# Patient Record
Sex: Male | Born: 1945 | Race: White | Hispanic: No | Marital: Married | Smoking: Former smoker
Health system: Southern US, Community
[De-identification: ages and names within clinical notes are randomized; demographics above are authoritative.]

## PROBLEM LIST (undated history)

## (undated) DIAGNOSIS — Z87442 Personal history of urinary calculi: Secondary | ICD-10-CM

## (undated) DIAGNOSIS — R0600 Dyspnea, unspecified: Secondary | ICD-10-CM

## (undated) DIAGNOSIS — E119 Type 2 diabetes mellitus without complications: Secondary | ICD-10-CM

## (undated) DIAGNOSIS — Z902 Acquired absence of lung [part of]: Secondary | ICD-10-CM

## (undated) DIAGNOSIS — Z8619 Personal history of other infectious and parasitic diseases: Secondary | ICD-10-CM

## (undated) DIAGNOSIS — I472 Ventricular tachycardia, unspecified: Secondary | ICD-10-CM

## (undated) DIAGNOSIS — N201 Calculus of ureter: Secondary | ICD-10-CM

## (undated) DIAGNOSIS — Z972 Presence of dental prosthetic device (complete) (partial): Secondary | ICD-10-CM

## (undated) DIAGNOSIS — I4510 Unspecified right bundle-branch block: Secondary | ICD-10-CM

## (undated) DIAGNOSIS — I4729 Other ventricular tachycardia: Secondary | ICD-10-CM

## (undated) DIAGNOSIS — R053 Chronic cough: Secondary | ICD-10-CM

## (undated) DIAGNOSIS — I4719 Other supraventricular tachycardia: Secondary | ICD-10-CM

## (undated) DIAGNOSIS — R05 Cough: Secondary | ICD-10-CM

## (undated) DIAGNOSIS — Z85038 Personal history of other malignant neoplasm of large intestine: Secondary | ICD-10-CM

## (undated) DIAGNOSIS — I471 Supraventricular tachycardia: Secondary | ICD-10-CM

## (undated) DIAGNOSIS — M1990 Unspecified osteoarthritis, unspecified site: Secondary | ICD-10-CM

## (undated) DIAGNOSIS — Z9889 Other specified postprocedural states: Secondary | ICD-10-CM

## (undated) DIAGNOSIS — J189 Pneumonia, unspecified organism: Secondary | ICD-10-CM

## (undated) DIAGNOSIS — I10 Essential (primary) hypertension: Secondary | ICD-10-CM

## (undated) DIAGNOSIS — R0609 Other forms of dyspnea: Secondary | ICD-10-CM

## (undated) DIAGNOSIS — E782 Mixed hyperlipidemia: Secondary | ICD-10-CM

## (undated) DIAGNOSIS — I493 Ventricular premature depolarization: Secondary | ICD-10-CM

## (undated) HISTORY — DX: Pneumonia, unspecified organism: J18.9

## (undated) HISTORY — PX: CYSTOSCOPY/RETROGRADE/URETEROSCOPY/STONE EXTRACTION WITH BASKET: SHX5317

## (undated) HISTORY — PX: TONSILLECTOMY AND ADENOIDECTOMY: SUR1326

## (undated) HISTORY — PX: CARDIAC CATHETERIZATION: SHX172

## (undated) HISTORY — PX: COLON RESECTION SIGMOID: SHX6737

---
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
## 1999-03-10 ENCOUNTER — Inpatient Hospital Stay (HOSPITAL_COMMUNITY): Admission: EM | Admit: 1999-03-10 | Discharge: 1999-03-12 | Payer: Self-pay | Admitting: Emergency Medicine

## 1999-03-10 ENCOUNTER — Encounter: Payer: Self-pay | Admitting: Emergency Medicine

## 1999-03-28 ENCOUNTER — Encounter: Admission: RE | Admit: 1999-03-28 | Discharge: 1999-03-28 | Payer: Self-pay | Admitting: Internal Medicine

## 1999-05-10 ENCOUNTER — Emergency Department (HOSPITAL_COMMUNITY): Admission: EM | Admit: 1999-05-10 | Discharge: 1999-05-10 | Payer: Self-pay | Admitting: Emergency Medicine

## 1999-05-29 ENCOUNTER — Encounter: Admission: RE | Admit: 1999-05-29 | Discharge: 1999-05-29 | Payer: Self-pay | Admitting: Internal Medicine

## 1999-10-05 ENCOUNTER — Encounter: Admission: RE | Admit: 1999-10-05 | Discharge: 1999-10-05 | Payer: Self-pay | Admitting: Internal Medicine

## 1999-10-08 ENCOUNTER — Encounter: Admission: RE | Admit: 1999-10-08 | Discharge: 1999-10-08 | Payer: Self-pay | Admitting: Hematology and Oncology

## 2000-01-17 ENCOUNTER — Encounter: Admission: RE | Admit: 2000-01-17 | Discharge: 2000-01-17 | Payer: Self-pay | Admitting: Internal Medicine

## 2001-03-30 ENCOUNTER — Encounter: Admission: RE | Admit: 2001-03-30 | Discharge: 2001-03-30 | Payer: Self-pay

## 2001-04-01 ENCOUNTER — Encounter: Admission: RE | Admit: 2001-04-01 | Discharge: 2001-04-01 | Payer: Self-pay | Admitting: Internal Medicine

## 2002-01-25 ENCOUNTER — Emergency Department (HOSPITAL_COMMUNITY): Admission: EM | Admit: 2002-01-25 | Discharge: 2002-01-25 | Payer: Self-pay | Admitting: Emergency Medicine

## 2002-01-25 ENCOUNTER — Encounter: Payer: Self-pay | Admitting: Emergency Medicine

## 2002-02-08 ENCOUNTER — Inpatient Hospital Stay (HOSPITAL_COMMUNITY): Admission: EM | Admit: 2002-02-08 | Discharge: 2002-02-10 | Payer: Self-pay | Admitting: Emergency Medicine

## 2002-02-08 ENCOUNTER — Encounter (INDEPENDENT_AMBULATORY_CARE_PROVIDER_SITE_OTHER): Payer: Self-pay | Admitting: Cardiology

## 2002-02-08 ENCOUNTER — Encounter: Payer: Self-pay | Admitting: Emergency Medicine

## 2002-05-18 ENCOUNTER — Encounter: Admission: RE | Admit: 2002-05-18 | Discharge: 2002-05-18 | Payer: Self-pay | Admitting: Internal Medicine

## 2002-06-21 ENCOUNTER — Ambulatory Visit (HOSPITAL_COMMUNITY): Admission: RE | Admit: 2002-06-21 | Discharge: 2002-06-21 | Payer: Self-pay | Admitting: Internal Medicine

## 2002-06-21 ENCOUNTER — Emergency Department (HOSPITAL_COMMUNITY): Admission: EM | Admit: 2002-06-21 | Discharge: 2002-06-21 | Payer: Self-pay | Admitting: Emergency Medicine

## 2002-06-21 ENCOUNTER — Encounter: Admission: RE | Admit: 2002-06-21 | Discharge: 2002-06-21 | Payer: Self-pay | Admitting: Internal Medicine

## 2003-03-22 ENCOUNTER — Encounter: Admission: RE | Admit: 2003-03-22 | Discharge: 2003-03-22 | Payer: Self-pay | Admitting: Internal Medicine

## 2003-05-27 ENCOUNTER — Encounter: Admission: RE | Admit: 2003-05-27 | Discharge: 2003-05-27 | Payer: Self-pay | Admitting: Internal Medicine

## 2003-06-07 ENCOUNTER — Encounter: Admission: RE | Admit: 2003-06-07 | Discharge: 2003-06-07 | Payer: Self-pay | Admitting: Internal Medicine

## 2003-07-01 ENCOUNTER — Encounter: Admission: RE | Admit: 2003-07-01 | Discharge: 2003-07-01 | Payer: Self-pay | Admitting: Internal Medicine

## 2004-02-01 ENCOUNTER — Emergency Department (HOSPITAL_COMMUNITY): Admission: EM | Admit: 2004-02-01 | Discharge: 2004-02-01 | Payer: Self-pay | Admitting: Emergency Medicine

## 2004-02-17 ENCOUNTER — Emergency Department (HOSPITAL_COMMUNITY): Admission: EM | Admit: 2004-02-17 | Discharge: 2004-02-17 | Payer: Self-pay

## 2004-02-21 ENCOUNTER — Ambulatory Visit: Payer: Self-pay | Admitting: Internal Medicine

## 2004-11-18 ENCOUNTER — Emergency Department (HOSPITAL_COMMUNITY): Admission: EM | Admit: 2004-11-18 | Discharge: 2004-11-18 | Payer: Self-pay | Admitting: Emergency Medicine

## 2004-11-25 ENCOUNTER — Encounter: Admission: RE | Admit: 2004-11-25 | Discharge: 2004-11-25 | Payer: Self-pay | Admitting: Specialist

## 2005-01-21 ENCOUNTER — Ambulatory Visit (HOSPITAL_COMMUNITY): Admission: RE | Admit: 2005-01-21 | Discharge: 2005-01-21 | Payer: Self-pay | Admitting: *Deleted

## 2005-01-21 ENCOUNTER — Encounter (INDEPENDENT_AMBULATORY_CARE_PROVIDER_SITE_OTHER): Payer: Self-pay | Admitting: Specialist

## 2005-03-01 ENCOUNTER — Inpatient Hospital Stay (HOSPITAL_COMMUNITY): Admission: RE | Admit: 2005-03-01 | Discharge: 2005-03-05 | Payer: Self-pay

## 2005-03-01 ENCOUNTER — Encounter (INDEPENDENT_AMBULATORY_CARE_PROVIDER_SITE_OTHER): Payer: Self-pay | Admitting: Specialist

## 2007-05-11 ENCOUNTER — Ambulatory Visit (HOSPITAL_COMMUNITY): Admission: RE | Admit: 2007-05-11 | Discharge: 2007-05-11 | Payer: Self-pay | Admitting: *Deleted

## 2007-05-11 ENCOUNTER — Encounter (INDEPENDENT_AMBULATORY_CARE_PROVIDER_SITE_OTHER): Payer: Self-pay | Admitting: *Deleted

## 2010-09-25 NOTE — Op Note (Signed)
NAMECAROLD, EISNER                ACCOUNT NO.:  [id number]   MEDICAL RECORD NO.:  [id number]          PATIENT TYPE:  AMB   LOCATION:  ENDO                         FACILITY:  [facility]   PHYSICIAN:  Georgiana Spinner, M.D.    DATE OF BIRTH:  [birth year]   DATE OF PROCEDURE:  DATE OF DISCHARGE:                               OPERATIVE REPORT   PROCEDURE:  Colonoscopy.   INDICATIONS:  Colon cancer.   ANESTHESIA:  Fentanyl 100 mcg and Versed 6 mg.   PROCEDURE:  With the patient mildly sedated in the left lateral  decubitus position, a rectal examination was attempted.  Subsequently,  the Pentax videoscopic colonoscope was inserted in the rectum and passed  under direct vision with pressure applied.  We were able to reach the  cecum identified by the ileocecal valve and base of cecum, both of which  were photographed.  From this point, the colonoscope was slowly  withdrawn, taking circumferential views of the colonic mucosa stopping  first in the ascending colon where a small polyp was seen, photographed  and removed using hot biopsy forceps technique setting of 20/150 blended  current.  We next stopped in the descending colon where a similar  appearing polyp was seen and it too was removed using hot biopsy forceps  technique, both with a setting of 20/150 blended current.  The endoscope  was then withdrawn all the way to the rectum which appeared normal on  direct and showed hemorrhoids on retroflexed view.  The endoscope was  straightened and withdrawn.  The patient's vital signs and pulse  oximeter remained stable.  The patient tolerated the procedure well  without apparent complications.   FINDINGS:  Two small polyps as noted, one in the ascending and one in  the descending colon.  Internal hemorrhoids were noted as well.  Otherwise, an unremarkable exam.   PLAN:  Await biopsy report.  The patient will call me for results and  followup with me as needed as an outpatient.     ______________________________  Georgiana Spinner, M.D.     GMO/MEDQ  D:  05/11/2007  T:  05/11/2007  Job:  [id number]

## 2010-09-28 NOTE — Cardiovascular Report (Signed)
NAME:  Herbert Evans, Herbert Evans                          ACCOUNT NO.:  [id number]   MEDICAL RECORD NO.:  [id number]                   PATIENT TYPE:  INP   LOCATION:  2908                                 FACILITY:  [facility]   PHYSICIAN:  Robynn Pane, M.D.               DATE OF BIRTH:  [birth year]   DATE OF PROCEDURE:  02/09/2002  DATE OF DISCHARGE:  02/10/2002                              CARDIAC CATHETERIZATION   PROCEDURE:  .  Left cardiac catheterization with selective right and left coronary  angiography, left ventriculography via right groin using Judkins technique.   INDICATION FOR THE PROCEDURE:  The patient is a 65 year old white male with  a past medical history significant for non-insulin dependent diabetes  mellitus, hypercholesterolemia, history of recurrent SVT who came to the  office complaining of fluttering in the chest pain associated with  lightheadedness since yesterday and was noted to be in SVT.  Patient was  converted to sinus rhythm with IV adenosine.  Patient states he had a  similar episode approximately two weeks ago requiring Adenosine. Patient  also gives history of chest pain radiating to the neck, grade 4/10 described  as tightness associated with diaphoresis when under stress or with exertion  lasting 20-30 minutes relieved with rest.  Denies any syncopal episodes in  the past.  Denies any cardiac work-up in the past.  Denies PND, orthopnea or  leg swelling.  Denies prolonged immobilization. Denies shortness of breath.  Denies fever, cough or chills.   PAST MEDICAL HISTORY:  As above.   PAST SURGICAL HISTORY:  He had left lung partial resection x2 at the age of  78 and 89 and left ear mastoid surgery at the age of 21.   ALLERGIES:  No known drug allergies.   HOME MEDICATIONS:  1. Glucophage 500 mg p.o. b.i.d.  2. Baby aspirin 1 p.o. daily  3. Patient was on some statin which he does not remember, which he could not     tolerate.   SOCIAL HISTORY:  He is  married and has four children. No history of smoking.  Drinks beer occasionally.  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  Born in  [place] and lives in [place].   FAMILY HISTORY:  Father died of stroke, mother died of massive MI in her  36s.  He has four sisters in good health.   PHYSICAL EXAMINATION:  GENERAL:  On examination, he is awake, alert and  oriented x 3 in no acute distress.  VITAL SIGNS:  Blood pressure was 103/66, pulse was 78, regular.  HEENT:  Conjunctivae was pink.  NECK:  Supple, no JVD, no bruits.  LUNGS:  Patient has decreased breath sounds at left lung field.  HEART: Cardiovascular examination: S1 and S2 was normal. There was no S3  gallop or murmur.  ABDOMEN:  Soft, bowel sounds are present, nontender.  EXTREMITY:  There is no clubbing,  cyanosis or edema.   LABORATORY AND ACCESSORY DATA:  His ECG showed normal sinus rhythm with ST-T  wave changes, ST-T wave depression in anterolateral leads, which were new as  compared to May 18.  His cardiac enzymes are negative.  Troponin I and CPKs  are negative.  Due to typical anginal chest pain and new ECG changes, the  patient was discussed with various options of treatment, i.e. left  catheterization, possible PTCA and stenting versus noninvasive stress  testing, its risks and benefits, i.e., death, MI, stroke, need for emergency  CABG, risks of re-stenosis, local vascular complications, etc., and  consented for PCI.   DESCRIPTION OF PROCEDURE:  After obtaining the informed consent, the patient  was brought to the catheterization lab and was placed on the fluoroscopy  table.  The right groin was prepped and draped in the usual fashion. Then 2%  Xylocaine was used for local anesthesia in the right groin. With the help of  thin wall needle, a 6 French  arterial sheath was placed. The sheath was  aspirated and flushed. Next, a 6 French left Judkins catheter was advanced  over the wire under fluoroscopic guidance up  to the ascending aorta. The  wire was pulled out, the catheter was aspirated and connected to the  manifold. The catheter was further advanced and engaged into left coronary  ostium.  Multiple views of the left system were taken. Next, the catheter  was disengaged and was pulled out over the wire and was replaced with a 6  French right Judkins catheter, which was advanced over the wire under  fluoroscopic guidance up to the ascending aorta. The wire was pulled out,  the catheter was aspirated and connected to the manifold. The catheter was  further advanced and engaged into the right coronary ostium.  A single view  of the right coronary artery was obtained. Next, the catheter was disengaged  and was pulled out over the wire and was replaced with 6 French pigtail  catheter, which was advanced over the wire under fluoroscopic guidance up to  the ascending aorta.  The wire was pulled out, the catheter was aspirated  and connected to the manifold.  The catheter was further advanced across the  aortic valve into the LV.  LV pressure were recorded.  Next, LV graph was  done in 30-degree RAO position. Post angiographic pressures were recorded  from LV and then pullback pressures were recorded from the aorta.  There was  no gradient across the aortic valve.  Next, the pigtail catheter was pulled  out over the wire, sheaths were aspirated and flushed.   FINDINGS:  1. The patient has good left ventricular systolic function, EF of 50-55%.  2. Left main coronary artery was patent.  3. 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  Diagonal #1 is very small which is patent.  Diagonal #2 is     medium sized which is patent.  4. Left circumflex is large, which is patent. Obtuse marginal #1 is large     which is patent.  Obtuse marginal #2 is small which is patent.  The     patient has left dominant coronary system.  5.    Right coronary artery is small which is  nondominant and is patent.  6. Arteriotomy was closed with Perclose without any complications. The     patient tolerated the procedure well and was transferred to recovery room  in stable condition.                                               Robynn Pane, M.D.    MNH/MEDQ  D:  02/09/2002  T:  02/12/2002  Job:  [id number]   cc:   Cardiac Catheterization Laboratory   Talmage Coin, MD  [address]  [place], [place] [address]  Fax: [phone]

## 2010-09-28 NOTE — Discharge Summary (Signed)
NAME:  Herbert Evans, Herbert Evans                          ACCOUNT NO.:  [id number]   MEDICAL RECORD NO.:  [id number]                   PATIENT TYPE:  INP   LOCATION:  2908                                 FACILITY:  [facility]   PHYSICIAN:  Hillery Aldo, M.D.                DATE OF BIRTH:  June 12, 1945   DATE OF ADMISSION:  02/08/2002  DATE OF DISCHARGE:  02/10/2002                                 DISCHARGE SUMMARY   DISCHARGE DIAGNOSES:  1. Supraventricular tachycardia.  2. 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  3. Hyperlipidemia.  4. Chewing tobacco, current usage.   DISCHARGE MEDICATIONS:  1. Metformin 500 mg one pill b.i.d.  2. Aspirin 81 mg one pill daily.  3. Toprol XL 25 mg daily.  4. Pravachol 40 mg daily.   PROCEDURE:  Percutaneous cardiac catheterization performed February 09, 2002.   CONSULTATION:  Dr. Sharyn Lull, cardiology.   HISTORY OF PRESENT ILLNESS:  The patient is a 65 year old male with diabetes  and hyperlipidemia who complains of feeling weak and having a racing  heartbeat since February 07, 2002, the day prior to admission.  He also  reported chest pain with exertion that is substernal and it goes away with  rest after about 15 or 20 minutes.  His pain did not radiate and it is  associated with mild shortness of breath and no nausea, vomiting, or  diaphoresis.  He has no known history of coronary artery disease.  Physical  exam revealed no jugular venous distention, his lungs were clear, his heart  had regular rate and rhythm with rate at 60 after adenosine cardioversion in  the emergency department, his abdomen was benign and his lower extremities  showed no edema.   ADMITTING LABORATORIES:  Blood gas: pH 7.40, pACO2 41.7, pAO2 180, bicarb of  26, base balance 1, saturation 100%.  CK total 61, CK-MB 1.8, troponin 0.04.  EKG prior to adenosine cardioversion showed a rate of 151 beats per minute  with right axis deviation.  After adenosine his rate was 81, normal  sinus  rhythm, did reveal some inverted T waves.  Other labs at the time of  admission included a sodium of 138, potassium 4.2, chloride 105, bicarb 26,  BUN 19, creatinine 1.0.  White blood cell count 11.1, hemoglobin 16.3,  hematocrit 47.2, platelets 171, ANC 7.3, MCV 87.9.  PT 13.6, PTT 27, INR  1.0.  Bilirubin 1.0, alkaline phosphatase 94, SGOT 26, SGPT 40, albumin 3.7,  and calcium 8.9.   HOSPITAL COURSE:  1. SUPRAVENTRICULAR TACHYCARDIA.  After cardioversion with adenosine in the     emergency department upon admission on February 08, 2002, the patient     showed no evidence of supraventricular tachycardia during his hospital     stay.  He received cardiac catheterization on February 09, 2002 which     showed 10-15% stenosis of the LAD, an ejection fraction of  50-55%.  His     EKGs after the time of admission did not show any inverted T waves and     the initial inverted T waves were thought to be due to other sources of     hypoxia such as his prior rapid heart rate.  Given the cardiac     catheterization findings and the patient's stability, his major issues     with regard to his supraventricular tachycardia were thought to be     preventative care including a beta blocker which he will take at home and     general cardiac care including control of his diabetes for which he is     already on metformin.  In addition, he was given a Glucometer to use at     home.  In addition, the patient was found to have ongoing hyperlipidemia     during his hospital stay with a total cholesterol of 213, HDL of 39, LDL     of 148, and triglycerides of 129.  He has been on several cholesterol     medications in 2001 including Lipitor to which he had an adverse reaction     that included fatigue and muscle cramping with no elevation in LFTs.     Next, he was placed on Pravachol during that same year and he and his     wife chose to discontinue that medication due to their concerns about      medications that were similar to Baycol, approximately 1 year later he     was placed on Welchol and had a rash and that medicine was discontinued,     at this time we feel that it is very important that he restart a     cholesterol medication and as he tolerated Pravachol in the past he will     restart on this medication at 40 mg daily.  The patient and his family     expressed understanding that this medication would not be dangerous to     him and that it was important for him to take this with regard to his     general cardiac care.  At time of discharge his cardiac exam revealed     regular rate and rhythm without murmurs, rubs, or gallops with rate     around 60, his cardiac catheterization site was without hematoma or     bruit, peripheral pulses were 2+.  He has agreed to delay his return to     work until February 15, 2002 at the recommendation of cardiology and to     avoid heavy lifting for 3 weeks after discharge date.  In addition, he     will attempt to follow a low-sugar, low-fat diet.   1. DIABETES MELLITUS.  As discussed above.  In addition, he has reported     that his blood sugars at home have ranged in the 120s up to the 150s or     160s and are relatively well controlled on metformin.  He will monitor     these with his new home Glucometer and we will review his findings in     continuity clinic.   1. THROMBOCYTOPENIA.  On admission the patient had a platelet count of 171.     This decreased to 127 on the second day of his hospital stay after     administration of heparin drip in the emergency department due to his     chest  pain and Plavix.  At the time of discharge his platelets were 140.     Heparin-induced thrombocytopenia panel was sent to test for antibodies     however, this test was not back at the time of discharge, this was not     thought to be of great concern since the patient's heparin was    discontinued when it became clear that he did not need  anticoagulation     any longer.  If this test does come back positive we will inform the     patient and his doctor of these findings however, no action is needed at     this time.   DISCHARGE LABORATORIES:  Sodium 137, potassium 3.9, chloride 104, bicarb 26,  BUN 11, creatinine 0.9, blood glucose 165.  White blood cell count 5.9,  hemoglobin 15.2, hematocrit 44.2, platelet count 140, MCV 88.1.  In  addition, a TSH drawn on February 08, 2002 was found to be 1.396.  After  adenosine cardioversion in the emergency department, the patient's EKGs  continued to show normal sinus rhythm with no evidence of ischemia.  Followup cardiac enzymes revealed total CK of 44, CK-MB of 1.5, and troponin  I of 0.02 on February 09, 2002.  Chest x-ray on February 08, 2002 as  compared with prior exam on January 25, 2002 showed slight increase in  heart size.   FOLLOW UP:  The patient will follow up with Dr. Okey Dupre in the continuity  clinic on March 02, 2002.     Amparo Bristol, MS-IV                       Hillery Aldo, M.D.    CF/MEDQ  D:  02/10/2002  T:  02/15/2002  Job:  [id number]   cc:   Eliseo Gum, M.D.

## 2010-09-28 NOTE — Op Note (Signed)
[name], Herbert Evans                ACCOUNT NO.:  [id number]   MEDICAL RECORD NO.:  [id number]          PATIENT TYPE:  INP   LOCATION:  0005                         [facility]ACILITY[facility]   PHYSICIAN:  Lebron Conners, M.D.   DATE OF BIRTH:  1945-08-22   DATE OF PROCEDURE:  03/01/2005  DATE OF DISCHARGE:                                 OPERATIVE REPORT   PREOPERATIVE DIAGNOSIS:  Carcinoma of the sigmoid colon.   POSTOPERATIVE DIAGNOSIS:  Carcinoma of the sigmoid colon.   OPERATION:  Sigmoid colectomy with anastomosis.   SURGEON:  Dr. Orson Slick   ASSISTANT:  Dr. Jamey Ripa   ANESTHESIA:  General.   BLOOD LOSS:  About 100 mL.   COMPLICATIONS:  None.   CONDITION AT END OF OPERATION:  Good.   PROCEDURE:  After the patient was monitored and anesthetized and had a Foley  catheter and routine preparation and draping of the abdomen, I made a lower  abdominal midline incision from about 2 cm below the umbilicus down to the  pubic symphysis.  I carefully entered the abdominal cavity and explored it.  There was an obvious tumor of the mid sigmoid colon which was fairly mobile.  The area around the aorta had no lymphadenopathy.  There was no evidence of  any peritoneal metastasis.  The left and right lobes of the liver felt  normal, and the gallbladder felt normal.  After packing away the small  bowel, I mobilized the sigmoid colon by incising the peritoneum lateral to  it and continued the mobilization for several centimeters up the lateral  aspect of the descending colon.  I then made a score on the mesentery above  the sacral promontory and then marked out a wedge of resection, generously  encompassing the tumor with 8-10 cm of bowel on either side to be resected.  I divided the bowel proximally and distally with the cutting stapler.  I  then resected mesentery segmentally between clamps and ligated the vessels  with 2-0 silk.  I visualized and protected the left ureter.  After  completing the  resection, I felt that I had adequate resection to encompass  the mesenteric drainage adequately.  The ends of the bowel came together  very nicely and overlapped without tension.  I cut out staple lines and did  an end-to-end anastomosis with simple interrupted sutures of 3-0 silk, tying  the knots on the posterior row within the bowel and anteriorly outside the  bowel and rolling it in slightly.  I felt I had a very nice patent lumen and  good closure, and the vascularity of the bowel was good.  I irrigated  briefly and  felt hemostasis was good.  I closed the mesenteric defect with a few sutures  of 3-0 silk.  I closed the fascia with running #1 PDS suture and irrigated  the subcutaneous tissues and closed the skin with staples.  The sponge,  needle, and instrument counts were correct.      Lebron Conners, M.D.  Electronically Signed     WB/MEDQ  D:  03/01/2005  T:  03/01/2005  Job:  [id number]   cc:   Georgiana Spinner, M.D.  Fax: [phone]   Soyla Murphy. Renne Crigler, M.D.  Fax: [phone]

## 2010-09-28 NOTE — Op Note (Signed)
Herbert Evans, Herbert Evans                ACCOUNT NO.:  [id number]   MEDICAL RECORD NO.:  [id number]          PATIENT TYPE:  AMB   LOCATION:  ENDO                         [facility]AC[facility]   PHYSICIAN:  Georgiana Spinner, M.D.    DATE OF BIRTH:  September 01, 1945   DATE OF PROCEDURE:  01/21/2005  DATE OF DISCHARGE:                                 OPERATIVE REPORT   PROCEDURE:  Upper endoscopy.   INDICATIONS:  Hemoccult positivity.   ANESTHESIA:  Demerol 50, Versed 6 milligrams.   DESCRIPTION OF PROCEDURE:  With the patient mildly sedated in the left  lateral decubitus position, the Olympus videoscopic endoscope was inserted  in the mouth, passed under direct vision through the esophagus which  appeared normal. The squamocolumnar junction was well seen and biopsies were  taken around perimeter to rule out the possibility of Barrett's. We entered  into the stomach. The fundus, body, antrum, duodenal bulb, and second  portion of duodenum were visualized. From this point, the endoscope was  slowly withdrawn taking circumferential views of the duodenal mucosa until  the endoscope had been pulled back into the stomach, placed in retroflexion  to view the stomach from below. The endoscope was then straightened and  withdrawn taking circumferential views of the remaining gastric and  esophageal mucosa stopping in the body of the stomach where patchy erythema  was seen, photographed and biopsied. The patient's vital signs and pulse  oximeter remained stable. The patient tolerated the procedure well without  apparent complications.   FINDINGS:  Patchy erythema of the body of the stomach biopsied. Question of  Barrett's esophagus biopsied, await biopsy report. The patient will call me  for results and follow-up with me as an outpatient. Proceed to colonoscopy  as planned.           ______________________________  Georgiana Spinner, M.D.     GMO/MEDQ  D:  01/21/2005  T:  01/21/2005  Job:  [id number]

## 2010-09-28 NOTE — Discharge Summary (Signed)
NAMEJOLAN, Herbert Evans NO.:  [id number]   MEDICAL RECORD NO.:  [id number]          PATIENT TYPE:  INP   LOCATION:  0158                         FACILITY:  [facility]   PHYSICIAN:  Lebron Conners, M.D.   DATE OF BIRTH:  10/10/1945   DATE OF ADMISSION:  03/01/2005  DATE OF DISCHARGE:  03/05/2005                                 DISCHARGE SUMMARY   HISTORY:  The patient is a 65 year old white male who was found on  colonoscopy to have a cancer of the sigmoid colon. There is no evidence of  metastatic disease. There is past history of nervous stomach. The patient  has a history of paroxysmal atrial tachycardia, hyperlipidemia and type 2  diabetes. He is referred and admitted after bowel prep for colon resection.   HOSPITAL COURSE:  On the day of admission, March 01, 2005, the patient  underwent a sigmoid colectomy with anastomosis. The operation went well and  postoperative course was unremarkable. His pathology came back prior to  discharge showing colonic adenocarcinoma extending through the muscularis  propria and into the pericolic connective tissue but the margins of both  radial, proximal and distal were clear. There were 14 benign lymph nodes  present. The patient was eating well prior to discharge. His medical  problems were stable. The patient had been seen during the admission by  [facility] for paroxysmal episode of  supraventricular tachycardia which resolved with treatment. He was advised  to limit caffeine intake and was sent home on Toprol XL. He went home on  March 05, 2005 and arrangements were made for follow up with me and with  Dr. Renne Crigler.   DIAGNOSES:  1.  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  2.  Paroxysmal atrial tachycardia.  3.  Hyperlipidemia.  4.  Diabetes mellitus, type 2.   OPERATIONS:  Sigmoid colectomy with anastomosis.   DISCHARGE CONDITION:  Improved.      Lebron Conners, M.D.  Electronically Signed     WB/MEDQ  D:  03/19/2005  T:  03/19/2005  Job:  [id number]   cc:   Soyla Murphy. Renne Crigler, M.D.  Fax: [phone]

## 2010-09-28 NOTE — Op Note (Signed)
Herbert Evans, Herbert Evans                ACCOUNT NO.:  [id number]   MEDICAL RECORD NO.:  [id number]          PATIENT TYPE:  AMB   LOCATION:  ENDO                         FACILITY:  [facility]   PHYSICIAN:  Georgiana Spinner, M.D.    DATE OF BIRTH:  [birth year]   DATE OF PROCEDURE:  01/21/2005  DATE OF DISCHARGE:                                 OPERATIVE REPORT   PROCEDURE:  Colonoscopy.   INDICATIONS FOR PROCEDURE:  Rectal bleeding.   ANESTHESIA:  Demerol 20, Versed 2 mg.   DESCRIPTION OF PROCEDURE:  With the patient mildly sedated in the left  lateral decubitus position, the Olympus videoscopic colonoscope was inserted  into the rectum after a normal rectal exam and passed through a mass in the  sigmoid colon at approximately 35 cm. We were able to find our way to the  lumen and pass the endoscope eventually to the cecum identified by the  ileocecal valve and base of cecum both of which were photographed. From this  point, the colonoscope was slowly withdrawn taking circumferential views of  the colonic mucosa stopping then only at 35 cm from the anal verge at which  point the previously noted mass was biopsied. The endoscope was then  withdrawn to the rectum which appeared normal on direct and retroflexed  view. The endoscope was straightened and withdrawn. The patient's vital  signs and pulse oximeter remained stable. The patient tolerated the  procedure well without apparent complications.   FINDINGS:  Mass of the sigmoid colon biopsied, await biopsy report. The  patient will call me for results and followup with me as an outpatient.           ______________________________  Georgiana Spinner, M.D.     GMO/MEDQ  D:  01/21/2005  T:  01/21/2005  Job:  [id number]

## 2014-12-31 ENCOUNTER — Emergency Department (HOSPITAL_COMMUNITY)
Admission: EM | Admit: 2014-12-31 | Discharge: 2014-12-31 | Disposition: A | Discharge status: Home / Self Care | Payer: Medicare Other | Attending: Emergency Medicine | Admitting: Emergency Medicine

## 2014-12-31 ENCOUNTER — Emergency Department (HOSPITAL_COMMUNITY): Payer: Medicare Other

## 2014-12-31 ENCOUNTER — Encounter (HOSPITAL_COMMUNITY): Payer: Self-pay

## 2014-12-31 DIAGNOSIS — R059 Cough, unspecified: Secondary | ICD-10-CM

## 2014-12-31 DIAGNOSIS — R05 Cough: Secondary | ICD-10-CM | POA: Diagnosis not present

## 2014-12-31 DIAGNOSIS — Y998 Other external cause status: Secondary | ICD-10-CM | POA: Insufficient documentation

## 2014-12-31 DIAGNOSIS — R21 Rash and other nonspecific skin eruption: Secondary | ICD-10-CM | POA: Insufficient documentation

## 2014-12-31 DIAGNOSIS — W450XXA Nail entering through skin, initial encounter: Secondary | ICD-10-CM | POA: Diagnosis not present

## 2014-12-31 DIAGNOSIS — Z79899 Other long term (current) drug therapy: Secondary | ICD-10-CM | POA: Insufficient documentation

## 2014-12-31 DIAGNOSIS — E119 Type 2 diabetes mellitus without complications: Secondary | ICD-10-CM | POA: Diagnosis not present

## 2014-12-31 DIAGNOSIS — S91331A Puncture wound without foreign body, right foot, initial encounter: Secondary | ICD-10-CM | POA: Diagnosis present

## 2014-12-31 DIAGNOSIS — Z7982 Long term (current) use of aspirin: Secondary | ICD-10-CM | POA: Insufficient documentation

## 2014-12-31 DIAGNOSIS — Y9389 Activity, other specified: Secondary | ICD-10-CM | POA: Diagnosis not present

## 2014-12-31 DIAGNOSIS — Y9289 Other specified places as the place of occurrence of the external cause: Secondary | ICD-10-CM | POA: Diagnosis not present

## 2014-12-31 HISTORY — DX: Type 2 diabetes mellitus without complications: E11.9

## 2014-12-31 MED ORDER — HYDROCODONE-ACETAMINOPHEN 5-325 MG PO TABS
2.0000 | ORAL_TABLET | Freq: Once | ORAL | Status: AC
  Administered 2014-12-31: 2 via ORAL
  Filled 2014-12-31: qty 2

## 2014-12-31 MED ORDER — NAPROXEN 500 MG PO TABS: 500.0000 mg | ORAL_TABLET | Freq: Two times a day (BID) | ORAL | Status: DC

## 2014-12-31 MED ORDER — CIPROFLOXACIN HCL 500 MG PO TABS
500.0000 mg | ORAL_TABLET | Freq: Once | ORAL | Status: AC
  Administered 2014-12-31: 500 mg via ORAL
  Filled 2014-12-31: qty 1

## 2014-12-31 MED ORDER — CIPROFLOXACIN HCL 500 MG PO TABS: 500.0000 mg | ORAL_TABLET | Freq: Two times a day (BID) | ORAL | Status: DC

## 2014-12-31 MED ORDER — TETANUS-DIPHTH-ACELL PERTUSSIS 5-2.5-18.5 LF-MCG/0.5 IM SUSP
0.5000 mL | Freq: Once | INTRAMUSCULAR | Status: AC
  Administered 2014-12-31: 0.5 mL via INTRAMUSCULAR
  Filled 2014-12-31: qty 0.5

## 2014-12-31 NOTE — ED Notes (Signed)
He states he stepped on a board which had a nail in it, causing wound on plantar area of right foot.  His right foot has a dark area at ball of foot area with erythema at ant. Right foot corresponding with dark area underneath.  He c/o his right foot feeling "tender" and walks with slight limp.

## 2014-12-31 NOTE — ED Notes (Signed)
Bed: WTR6 Expected date:  Expected time:  Means of arrival:  Comments: 

## 2014-12-31 NOTE — Discharge Instructions (Signed)

## 2014-12-31 NOTE — ED Notes (Signed)
He also c/o cough/uri sx x 2 days.

## 2014-12-31 NOTE — ED Provider Notes (Signed)
CSN: [id number]     Arrival date & time 12/31/14  7829 History   First MD Initiated Contact with Patient 12/31/14 [phone]     Chief Complaint  Patient presents with  . Puncture Wound     (Consider location/radiation/quality/duration/timing/severity/associated sxs/prior Treatment) HPI  The patient is a 69 year old male, he has a history of diabetes, takes his medications routinely, 2 days ago he stepped on a nail that was in a board outside his house. The nail went through his shoe and into the bottom of his right foot over the pad underneath the first and second metatarsals. He immediately pulled the nail out, he has been applying salt water soaks and Betadine without improvement. He now has some swelling of the right foot and some redness on the dorsum of the foot. He denies fevers chills nausea vomiting. He has increased pain with ambulation. He is not up-to-date on tetanus.  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  No fevers, no phlegm  Past Medical History  Diagnosis Date  . Diabetes mellitus without complication    No past surgical history on file. History reviewed. No pertinent family history. Social History  Substance Use Topics  . Smoking status: Never Smoker   . Smokeless tobacco: None  . Alcohol Use: No    Review of Systems  Constitutional: Negative for fever.  Gastrointestinal: Negative for nausea and vomiting.  Musculoskeletal: Negative for back pain, joint swelling and neck pain.  Skin: Positive for rash and wound.  Neurological: Negative for weakness and numbness.      Allergies  Review of patient's allergies indicates no known allergies.  Home Medications   Prior to Admission medications   Medication Sig Start Date End Date Taking? Authorizing Provider  aspirin EC 81 MG tablet Take 81 mg by mouth every morning.   Yes Historical Provider, MD  lovastatin (MEVACOR) 40 MG tablet Take 40 mg by mouth at  bedtime.   Yes Historical Provider, MD  metFORMIN (GLUCOPHAGE) 1000 MG tablet Take 1,000 mg by mouth 2 (two) times daily with a meal.   Yes Historical Provider, MD  metoprolol succinate (TOPROL-XL) 100 MG 24 hr tablet Take 100 mg by mouth daily. Take with or immediately following a meal.   Yes Historical Provider, MD  ciprofloxacin (CIPRO) 500 MG tablet Take 1 tablet (500 mg total) by mouth every 12 (twelve) hours. 12/31/14   Eber Hong, MD  naproxen (NAPROSYN) 500 MG tablet Take 1 tablet (500 mg total) by mouth 2 (two) times daily with a meal. 12/31/14   Eber Hong, MD   BP 105/51 mmHg  Pulse 59  Temp(Src) 98.3 F (36.8 C) (Oral)  Resp 16  SpO2 99% Physical Exam  Constitutional: He appears well-developed and well-nourished.  HENT:  Head: Normocephalic and atraumatic.  Eyes: Conjunctivae are normal. Right eye exhibits no discharge. Left eye exhibits no discharge.  Pulmonary/Chest: Effort normal. No respiratory distress. He has no wheezes.  Normal lung sounds, no wheezing, no rales, speaks in full sentences  Musculoskeletal: He exhibits tenderness.  Swelling and tenderness of the distal right foot, small area of erythema between the first and second metatarsals near the webspace, mild asymmetry due to swelling in the distal foot, normal pulses at the bilateral feet at the dorsalis pedis, normal range of motion of toes  Neurological: He is alert. Coordination normal.  Skin: Skin is warm and dry. Rash noted. He is not diaphoretic.  There is erythema.  Small area of erythema on the dorsum of the right foot, small puncture wound on the plantar aspect of the right foot  Psychiatric: He has a normal mood and affect.  Nursing note and vitals reviewed.   ED Course  Procedures (including critical care time) Labs Review Labs Reviewed - No data to display  Imaging Review Dg Chest 2 View  12/31/2014   CLINICAL DATA:  Shortness of breath and cough for 3 days  EXAM: CHEST - 2 VIEW  COMPARISON:   05/23/2014  FINDINGS: Cardiac shadow is stable. Chronic changes in the left lung base are again seen and stable. The right lung is hyperinflated. Mild apical scarring is noted bilaterally. No acute abnormality is seen.  IMPRESSION: Persisting consolidation in the left lower lobe stable from the previous exam. No acute abnormality noted.   Electronically Signed   By: Alcide Clever M.D.   On: 12/31/2014 10:51   Dg Foot Complete Right  12/31/2014   CLINICAL DATA:  RIGHT foot pain.  Initial encounter.  EXAM: RIGHT FOOT COMPLETE - 3+ VIEW  COMPARISON:  None.  FINDINGS: There is no evidence of fracture or dislocation. There is no evidence of arthropathy or other focal bone abnormality. Soft tissues are unremarkable. Incidental note is made of calcaneal spurs.  IMPRESSION: Negative.   Electronically Signed   By: Andreas Newport M.D.   On: 12/31/2014 10:50   I have personally reviewed and evaluated these images and lab results as part of my medical decision-making.    MDM   Final diagnoses:  Puncture wound of foot, right, initial encounter  Cough    Puncture wound, needs tetanus updated, needs antibiotics, will image for foreign bodies, will also image chest as he has had prior lung surgery and now has a new cough. That being said he has no fever, no tachycardia, no hypoxia, no respiratory distress.  xrays neg for FB or new infiltrate.    Cipro, pt in agreement   Meds given in ED:  Medications  Tdap (BOOSTRIX) injection 0.5 mL (0.5 mLs Intramuscular Given 12/31/14 1003)  HYDROcodone-acetaminophen (NORCO/VICODIN) 5-325 MG per tablet 2 tablet (2 tablets Oral Given 12/31/14 1005)  ciprofloxacin (CIPRO) tablet 500 mg (500 mg Oral Given 12/31/14 1005)    New Prescriptions   CIPROFLOXACIN (CIPRO) 500 MG TABLET    Take 1 tablet (500 mg total) by mouth every 12 (twelve) hours.   NAPROXEN (NAPROSYN) 500 MG TABLET    Take 1 tablet (500 mg total) by mouth 2 (two) times daily with a meal.    Eber Hong, MD 12/31/14 1141

## 2016-05-13 HISTORY — PX: CATARACT EXTRACTION W/ INTRAOCULAR LENS  IMPLANT, BILATERAL: SHX1307

## 2018-09-29 DIAGNOSIS — Z7982 Long term (current) use of aspirin: Secondary | ICD-10-CM | POA: Insufficient documentation

## 2018-09-29 DIAGNOSIS — Z720 Tobacco use: Secondary | ICD-10-CM | POA: Insufficient documentation

## 2018-09-29 DIAGNOSIS — Z789 Other specified health status: Secondary | ICD-10-CM | POA: Insufficient documentation

## 2019-03-30 DIAGNOSIS — Z789 Other specified health status: Secondary | ICD-10-CM | POA: Insufficient documentation

## 2019-03-30 DIAGNOSIS — Z91148 Patient's other noncompliance with medication regimen for other reason: Secondary | ICD-10-CM | POA: Insufficient documentation

## 2019-05-12 ENCOUNTER — Emergency Department (HOSPITAL_COMMUNITY): Payer: Medicare Other

## 2019-05-12 ENCOUNTER — Encounter (HOSPITAL_COMMUNITY): Payer: Self-pay | Admitting: Emergency Medicine

## 2019-05-12 ENCOUNTER — Emergency Department (HOSPITAL_COMMUNITY)
Admission: EM | Admit: 2019-05-12 | Discharge: 2019-05-12 | Disposition: A | Discharge status: Home / Self Care | Payer: Medicare Other | Attending: Emergency Medicine | Admitting: Emergency Medicine

## 2019-05-12 ENCOUNTER — Other Ambulatory Visit: Payer: Self-pay

## 2019-05-12 DIAGNOSIS — R112 Nausea with vomiting, unspecified: Secondary | ICD-10-CM | POA: Diagnosis not present

## 2019-05-12 DIAGNOSIS — Z7984 Long term (current) use of oral hypoglycemic drugs: Secondary | ICD-10-CM | POA: Insufficient documentation

## 2019-05-12 DIAGNOSIS — N200 Calculus of kidney: Secondary | ICD-10-CM | POA: Insufficient documentation

## 2019-05-12 DIAGNOSIS — R319 Hematuria, unspecified: Secondary | ICD-10-CM | POA: Insufficient documentation

## 2019-05-12 DIAGNOSIS — E119 Type 2 diabetes mellitus without complications: Secondary | ICD-10-CM | POA: Diagnosis not present

## 2019-05-12 DIAGNOSIS — Z79899 Other long term (current) drug therapy: Secondary | ICD-10-CM | POA: Insufficient documentation

## 2019-05-12 DIAGNOSIS — R109 Unspecified abdominal pain: Secondary | ICD-10-CM | POA: Diagnosis present

## 2019-05-12 DIAGNOSIS — Z7982 Long term (current) use of aspirin: Secondary | ICD-10-CM | POA: Diagnosis not present

## 2019-05-12 LAB — URINALYSIS, ROUTINE W REFLEX MICROSCOPIC
Bilirubin Urine: NEGATIVE
Glucose, UA: 50 mg/dL — AB
Ketones, ur: 5 mg/dL — AB
Leukocytes,Ua: NEGATIVE
Nitrite: NEGATIVE
Protein, ur: NEGATIVE mg/dL
Specific Gravity, Urine: 1.004 — ABNORMAL LOW (ref 1.005–1.030)
pH: 6 (ref 5.0–8.0)

## 2019-05-12 LAB — LIPASE, BLOOD: Lipase: 36 U/L (ref 11–51)

## 2019-05-12 LAB — COMPREHENSIVE METABOLIC PANEL
ALT: 19 U/L (ref 0–44)
AST: 19 U/L (ref 15–41)
Albumin: 4.2 g/dL (ref 3.5–5.0)
Alkaline Phosphatase: 70 U/L (ref 38–126)
Anion gap: 11 (ref 5–15)
BUN: 13 mg/dL (ref 8–23)
CO2: 27 mmol/L (ref 22–32)
Calcium: 9.6 mg/dL (ref 8.9–10.3)
Chloride: 97 mmol/L — ABNORMAL LOW (ref 98–111)
Creatinine, Ser: 0.84 mg/dL (ref 0.61–1.24)
GFR calc Af Amer: >60 mL/min (ref >60)
GFR calc non Af Amer: >60 mL/min (ref >60)
Glucose, Bld: 226 mg/dL — ABNORMAL HIGH (ref 70–99)
Potassium: 4.2 mmol/L (ref 3.5–5.1)
Sodium: 135 mmol/L (ref 135–145)
Total Bilirubin: 0.8 mg/dL (ref 0.3–1.2)
Total Protein: 8 g/dL (ref 6.5–8.1)

## 2019-05-12 LAB — CBC
HCT: 48.3 % (ref 39.0–52.0)
Hemoglobin: 16.6 g/dL (ref 13.0–17.0)
MCH: 31.6 pg (ref 26.0–34.0)
MCHC: 34.4 g/dL (ref 30.0–36.0)
MCV: 91.8 fL (ref 80.0–100.0)
Platelets: 166 10*3/uL (ref 150–400)
RBC: 5.26 MIL/uL (ref 4.22–5.81)
RDW: 11.9 % (ref 11.5–15.5)
WBC: 9 10*3/uL (ref 4.0–10.5)
nRBC: 0 % (ref 0.0–0.2)

## 2019-05-12 MED ORDER — HYDROCODONE-ACETAMINOPHEN 5-325 MG PO TABS
2.0000 | ORAL_TABLET | Freq: Once | ORAL | Status: AC
  Administered 2019-05-12: 2 via ORAL
  Filled 2019-05-12: qty 2

## 2019-05-12 MED ORDER — ONDANSETRON 4 MG PO TBDP: 4.0000 mg | ORAL_TABLET | Freq: Three times a day (TID) | ORAL | 0 refills | Status: DC | PRN

## 2019-05-12 MED ORDER — HYDROCODONE-ACETAMINOPHEN 5-325 MG PO TABS: 1.0000 | ORAL_TABLET | Freq: Four times a day (QID) | ORAL | 0 refills | Status: DC | PRN

## 2019-05-12 MED ORDER — MORPHINE SULFATE (PF) 4 MG/ML IV SOLN
4.0000 mg | Freq: Once | INTRAVENOUS | Status: AC
  Administered 2019-05-12: 15:00:00 4 mg via INTRAVENOUS
  Filled 2019-05-12: qty 1

## 2019-05-12 MED ORDER — TAMSULOSIN HCL 0.4 MG PO CAPS
0.4000 mg | ORAL_CAPSULE | Freq: Once | ORAL | Status: AC
  Administered 2019-05-12: 0.4 mg via ORAL
  Filled 2019-05-12: qty 1

## 2019-05-12 MED ORDER — TAMSULOSIN HCL 0.4 MG PO CAPS: 0.4000 mg | ORAL_CAPSULE | Freq: Every day | ORAL | 0 refills | Status: DC

## 2019-05-12 MED ORDER — ONDANSETRON HCL 4 MG/2ML IJ SOLN
2.0000 mg | Freq: Once | INTRAMUSCULAR | Status: AC
  Administered 2019-05-12: 2 mg via INTRAVENOUS
  Filled 2019-05-12: qty 2

## 2019-05-12 NOTE — ED Provider Notes (Signed)
[place] DEPT Provider Note   CSN: [id number] Arrival date & time: 05/12/19  1352     History Chief Complaint  Patient presents with  . Flank Pain  . Abdominal Pain    Herbert Evans is a 73 y.o. male history diabetes, lung resection, hyperlipidemia, hypertension.  Patient reports right flank pain beginning last night, sudden onset no clear inciting factors.  Describes a sharp throb moderate intensity constant radiating down towards her right groin no clear aggravating or alleviating factors, no medications prior to arrival.  Reports history of kidney stones as a young adult and reports feels very similar.  Associated symptom of hematuria today, nausea with nonbloody/nonbilious emesis when pain is at its greatest.  Denies fever/chills, headache, neck pain, chest pain/shortness of breath, fall/injury, dysuria, testicular pain/swelling, rash or any additional concerns. HPI     Past Medical History:  Diagnosis Date  . Diabetes mellitus without complication ([place])     There are no problems to display for this patient.   History reviewed. No pertinent surgical history.     No family history on file.  Social History   Tobacco Use  . Smoking status: Never Smoker  Substance Use Topics  . Alcohol use: No  . Drug use: Not on file    Home Medications Prior to Admission medications   Medication Sig Start Date End Date Taking? Authorizing Provider  aspirin EC 81 MG tablet Take 81 mg by mouth every morning.   Yes [provider]  Aspirin-Salicylamide-Caffeine (BC FAST PAIN RELIEF) 650-195-33.3 MG PACK Take 1 Package by mouth daily as needed (mild pain).   Yes [provider]  metFORMIN (GLUCOPHAGE) 1000 MG tablet Take 1,000 mg by mouth 2 (two) times daily with a meal.   Yes [provider]  metoprolol succinate (TOPROL-XL) 100 MG 24 hr tablet Take 100 mg by mouth daily. Take with or immediately following a meal.    Yes [provider]  traZODone (DESYREL) 50 MG tablet Take 50 mg by mouth at bedtime.   Yes [provider]  ciprofloxacin (CIPRO) 500 MG tablet Take 1 tablet (500 mg total) by mouth every 12 (twelve) hours. Patient not taking: Reported on 05/12/2019 12/31/14   Noemi Chapel, MD  HYDROcodone-acetaminophen (NORCO/VICODIN) 5-325 MG tablet Take 1-2 tablets by mouth every 6 (six) hours as needed. 05/12/19   Nuala Alpha A, PA-C  naproxen (NAPROSYN) 500 MG tablet Take 1 tablet (500 mg total) by mouth 2 (two) times daily with a meal. Patient not taking: Reported on 05/12/2019 12/31/14   Noemi Chapel, MD  ondansetron (ZOFRAN ODT) 4 MG disintegrating tablet Take 1 tablet (4 mg total) by mouth every 8 (eight) hours as needed for nausea or vomiting. 05/12/19   Deliah Boston, PA-C  tamsulosin (FLOMAX) 0.4 MG CAPS capsule Take 1 capsule (0.4 mg total) by mouth daily after breakfast. 05/12/19   Nuala Alpha A, PA-C    Allergies    Atorvastatin and Rosuvastatin  Review of Systems   Review of Systems Ten systems are reviewed and are negative for acute change except as noted in the HPI  Physical Exam Updated Vital Signs BP (!) 145/77   Pulse 84   Temp 98.2 F (36.8 C) (Oral)   Resp 18   SpO2 95%   Physical Exam Constitutional:      General: He is not in acute distress.    Appearance: Normal appearance. He is well-developed. He is not ill-appearing or diaphoretic.  HENT:  Head: Normocephalic and atraumatic.     Right Ear: External ear normal.     Left Ear: External ear normal.     Nose: Nose normal.  Eyes:     General: Vision grossly intact. Gaze aligned appropriately.     Pupils: Pupils are equal, round, and reactive to light.  Neck:     Trachea: Trachea and phonation normal. No tracheal deviation.  Pulmonary:     Effort: Pulmonary effort is normal. No respiratory distress.     Comments: Coarse breath sounds patient reports as baseline since lung  ectomy. Abdominal:     General: There is no distension.     Palpations: Abdomen is soft.     Tenderness: There is no abdominal tenderness. There is right CVA tenderness. There is no left CVA tenderness, guarding or rebound.  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  No external genital lesions noted, no bumps on head of penis, specifically no vesicles concerning for herpes or chancre suggestive of syphilis.  No pain with palpation of the penis/glans, no discharge or urethritis noted.  Scrotum and testicles without erythema/swelling or tenderness to palpation. Cremasteric reflex intact bilaterally. No palpable hernia noted.  Musculoskeletal:        General: Normal range of motion.     Cervical back: Normal range of motion.  Skin:    General: Skin is warm and dry.     Findings: No erythema or rash.          Comments: Well-healed surgical scar  Neurological:     Mental Status: He is alert.     GCS: GCS eye subscore is 4. GCS verbal subscore is 5. GCS motor subscore is 6.     Comments: Speech is clear and goal oriented, follows commands Major Cranial nerves without deficit, no facial droop Moves extremities without ataxia, coordination intact  Psychiatric:        Behavior: Behavior normal.     ED Results / Procedures / Treatments   Labs (all labs ordered are listed, but only abnormal results are displayed) Labs Reviewed  COMPREHENSIVE METABOLIC PANEL - Abnormal; Notable for the following components:      Result Value   Chloride 97 (*)    Glucose, Bld 226 (*)    All other components within normal limits  URINALYSIS, ROUTINE W REFLEX MICROSCOPIC - Abnormal; Notable for the following components:   Specific Gravity, Urine 1.004 (*)    Glucose, UA 50 (*)    Hgb urine dipstick LARGE (*)    Ketones, ur 5 (*)    Bacteria, UA RARE (*)    All other components within normal limits  LIPASE, BLOOD  CBC    EKG None  Radiology CT Renal Stone  Study  Result Date: 05/12/2019 CLINICAL DATA:  Right flank pain extending to the lower abdomen and groin starting last night EXAM: CT ABDOMEN AND PELVIS WITHOUT CONTRAST TECHNIQUE: Multidetector CT imaging of the abdomen and pelvis was performed following the standard protocol without IV contrast. COMPARISON:  Overlapping portions of CT chest from 09/17/2017 FINDINGS: Lower chest: Chronic volume loss at the left lung base with scattered reticulonodular opacities similar to the 09/17/2017 exam. Airway thickening noted. Calcified granuloma in the right middle lobe adjacent to the minor fissure. Calcified granuloma in the right upper lobe adjacent to the minor fissure. Tree-in-bud reticulonodular opacities in the right middle lobe and in the right lower lobe inferiorly compatible with chronic infection. Descending thoracic aortic atherosclerosis along  with atherosclerotic calcification of the left anterior descending and circumflex coronary arteries. Small chronic left posterior pericardial effusion, unchanged. Hepatobiliary: Unremarkable Pancreas: Unremarkable Spleen: Unremarkable Adrenals/Urinary Tract: Both adrenal glands appear normal. There is mild to moderate right hydronephrosis and moderate right hydroureter extending down to a 0.8 cm ureteral calculus just past the iliac vessel cross over. The right ureter distal to this point is normal caliber. A 7 mm right kidney lower pole nonobstructive renal calculus is present on image 48/5. Hypodense right mid upper renal lesion measuring 1.5 by 1.3 cm on image 36/5, probably a cyst technically nonspecific. There is some scarring in the right mid kidney with some adjacent faint hyperdensity shown on images 35-38 of series 2, similar to the 09/17/2017 exam. Stomach/Bowel: Unremarkable. Appendix normal. Vascular/Lymphatic: Aortoiliac atherosclerotic vascular disease. Reproductive: Unremarkable Other: No supplemental non-categorized findings. Musculoskeletal: Old left  lower rib deformities. Lower lumbar spondylosis and degenerative disc disease likely causing impingement at L4-5 and L5-S1. Lower thoracic spondylosis is present. IMPRESSION: 1. Obstructive 0.8 cm right ureteral calculus just past the iliac vessel cross over. There is also a 7 mm right kidney lower pole nonobstructive renal calculus. 2. Other imaging findings of potential clinical significance: Coronary atherosclerosis. Airway thickening is present, suggesting bronchitis or reactive airways disease. Chronic volume loss at the left lung base with scattered reticulonodular opacities compatible with chronic infection. Lower lumbar spondylosis and degenerative disc disease causing impingement at L4-5 and L5-S1. Small chronic left pericardial effusion, unchanged. Aortic Atherosclerosis (ICD10-I70.0). Electronically Signed   By: Gaylyn Rong M.D.   On: 05/12/2019 17:09    Procedures Procedures (including critical care time)  Medications Ordered in ED Medications  morphine 4 MG/ML injection 4 mg (4 mg Intravenous Given 05/12/19 1525)  ondansetron (ZOFRAN) injection 2 mg (2 mg Intravenous Given 05/12/19 1525)  HYDROcodone-acetaminophen (NORCO/VICODIN) 5-325 MG per tablet 2 tablet (2 tablets Oral Given 05/12/19 1857)  tamsulosin (FLOMAX) capsule 0.4 mg (0.4 mg Oral Given 05/12/19 1856)    ED Course  I have reviewed the triage vital signs and the nursing notes.  Pertinent labs & imaging results that were available during my care of the patient were reviewed by me and considered in my medical decision making (see chart for details).  Clinical Course as of May 11 1858  Wed May 12, 2019  1755 Dr. Laverle Patter   [BM]    Clinical Course User Index [BM] Elizabeth Palau   MDM Rules/Calculators/A&P                     73 year old male arrives today for right flank pain beginning last night.  Reports hematuria and nonbloody/nonbilious emesis today.  Reports similar to previous kidney stone.  Will  obtain labs and CT renal stone study, give pain medication and nausea medication. - CBC within normal limits Lipase within normal limits CMP with glucose 226, nonacute Urinalysis with hemoglobin, glucose, ketones, RBCs, rare bacteria, no nitrites or leukocytes CT renal stone study: IMPRESSION:  1. Obstructive 0.8 cm right ureteral calculus just past the iliac  vessel cross over. There is also a 7 mm right kidney lower pole  nonobstructive renal calculus.  2. Other imaging findings of potential clinical significance:  Coronary atherosclerosis. Airway thickening is present, suggesting  bronchitis or reactive airways disease. Chronic volume loss at the  left lung base with scattered reticulonodular opacities compatible  with chronic infection. Lower lumbar spondylosis and degenerative  disc disease causing impingement at L4-5 and L5-S1. Small chronic  left pericardial effusion, unchanged.    Aortic Atherosclerosis (ICD10-I70.0).   - Patient reassessed resting comfortably no acute distress reports improvement of pain. - 5:55 PM: Discussed case with urologist Dr. Laverle PatterBorden who will advised patient may be treated with Flomax, pain medication, nausea medication and discharged with urine strainer.  Patient is to call urology office tomorrow morning to schedule a follow-up appointment as low likelihood for stone to pass, no indication for emergent intervention at this time, patient to follow-up as outpatient.  No indication for antibiotics at this time. - Patient updated on care plan he is agreeable to outpatient management at this time, he plans to call Dr. Laverle PatterBorden tomorrow morning to schedule follow-up appointment.  I discussed narcotic precautions with patient and he states understanding, his wife will be driving home today.  Due to acute kidney stone will prescribe 6 pills Norco.  PMDP reviewed patient without active narcotic prescription.  Additionally patient updated on incidental findings on CT  scan and will follow up with his primary care provider this week.  On reevaluation he is resting comfortably and in no acute distress.  At this time there does not appear to be any evidence of an acute emergency medical condition and the patient appears stable for discharge with appropriate outpatient follow up. Diagnosis was discussed with patient who verbalizes understanding of care plan and is agreeable to discharge. I have discussed return precautions with patient who verbalizes understanding of return precautions. Patient encouraged to follow-up with their PCP and Urology All questions answered.  Patient seen and evaluated by Dr. Pilar PlateBero during this visit who agrees with discharge and outpatient follow-up.  Note: Portions of this report may have been transcribed using voice recognition software. Every effort was made to ensure accuracy; however, inadvertent computerized transcription errors may still be present. Final Clinical Impression(s) / ED Diagnoses Final diagnoses:  Kidney stone on right side    Rx / DC Orders ED Discharge Orders         Ordered    HYDROcodone-acetaminophen (NORCO/VICODIN) 5-325 MG tablet  Every 6 hours PRN     05/12/19 1857    tamsulosin (FLOMAX) 0.4 MG CAPS capsule  Daily after breakfast     05/12/19 1857    ondansetron (ZOFRAN ODT) 4 MG disintegrating tablet  Every 8 hours PRN     05/12/19 1857           Elizabeth PalauMorelli, Kynzley Dowson A, PA-C 05/12/19 1859    Sabas SousBero, Michael M, MD 05/12/19 2029

## 2019-05-12 NOTE — ED Triage Notes (Signed)
Patient reports right flank pain radiating to lower abdomen and groin since last night. Hx kidney stones. Reports hematuria and vomiting as well.

## 2019-05-12 NOTE — Discharge Instructions (Addendum)
You have been diagnosed today with kidney stone.  At this time there does not appear to be the presence of an emergent medical condition, however there is always the potential for conditions to change. Please read and follow the below instructions.  Please return to the Emergency Department immediately for any new or worsening symptoms. Please be sure to follow up with your Primary Care Provider within one week regarding your visit today; please call their office to schedule an appointment even if you are feeling better for a follow-up visit. You may take the pain medication Norco as prescribed for severe pain.  Do not drive or perform any dangerous activities with Norco as will make you drowsy.  Do not drink alcohol or take any other sedating medications with Norco as this will worsen side effects. You may use the medication Flomax as prescribed to help facilitate stone passage. Please strain your urine to catch the stone if it comes out. You may use the nausea medication Zofran as prescribed. Please call the urologist Dr. Alinda Money on your discharge paperwork first thing tomorrow morning to schedule a follow-up appointment for further evaluation and management.  As we discussed due to the size of your stone it may not pass on its own so close follow-up with the urologist is strongly advised. Your CT scan today also showed another 7 mm stone in your right kidney, be sure to discuss this with your urologist.  Additionally your CT scan today showed coronary atherosclerosis, airway thickening, volume loss of the left lung base with opacities concerning for chronic infection, lumbar spondylosis and degenerative disc disease causing impingement on L4-L5 and L5-S1, small chronic left pericardial effusion and aortic atherosclerosis.  Please discuss all of these incidental findings with your primary care provider at your follow-up visit this week.  Get help right away if: You have a fever or chills. You get  very bad pain. You get new pain in your belly (abdomen). You pass out (faint). You cannot pee. You have chest pain or trouble breathing You have any new/concerning or worsening of symptoms.  Please read the additional information packets attached to your discharge summary.  Do not take your medicine if  develop an itchy rash, swelling in your mouth or lips, or difficulty breathing; call 911 and seek immediate emergency medical attention if this occurs.  Note: Portions of this text may have been transcribed using voice recognition software. Every effort was made to ensure accuracy; however, inadvertent computerized transcription errors may still be present.

## 2019-07-11 ENCOUNTER — Ambulatory Visit: Payer: Medicare Other | Attending: Internal Medicine

## 2019-07-11 DIAGNOSIS — Z23 Encounter for immunization: Secondary | ICD-10-CM | POA: Insufficient documentation

## 2019-07-11 NOTE — Progress Notes (Signed)
   Covid-19 Vaccination Clinic  Name:  Herbert Evans    MRN: [id number] DOB: [birth year]  07/11/2019  Herbert Evans was observed post Covid-19 immunization for 15 minutes without incidence. He was provided with Vaccine Information Sheet and instruction to access the V-Safe system.   Herbert Evans was instructed to call 911 with any severe reactions post vaccine: Marland Kitchen Difficulty breathing  . Swelling of your face and throat  . A fast heartbeat  . A bad rash all over your body  . Dizziness and weakness    Immunizations Administered    Name Date Dose VIS Date Route   Pfizer COVID-19 Vaccine 07/11/2019  1:21 PM 0.3 mL 04/23/2019 Intramuscular   Manufacturer: [employer], [employer]   Lot: [id number]   NDC: [address]-9

## 2019-07-27 ENCOUNTER — Other Ambulatory Visit: Payer: Self-pay

## 2019-07-27 ENCOUNTER — Encounter (HOSPITAL_BASED_OUTPATIENT_CLINIC_OR_DEPARTMENT_OTHER): Payer: Self-pay | Admitting: Urology

## 2019-07-27 ENCOUNTER — Other Ambulatory Visit: Payer: Self-pay | Admitting: Urology

## 2019-07-27 NOTE — Progress Notes (Addendum)
Spoke w/ via phone for pre-op interview--- PT Lab needs dos----   Istat and EKG            Lab results------ current ekg in care everywhere 09-29-2018 COVID test ------  07-30-2019 @ 1405 Arrive at -------  0530 NPO after ---- [place] Medications to take morning of surgery --- Metoprolol w/ sips of water Diabetic medication ----- do not take metformin morning of surgery  Patient Special Instructions ----- n/a Pre-Op special Istructions ----- requested 12 lead ekg tracing from dr [employer] office, pt cardiologist, to be faxed. Patient verbalized understanding of instructions that were given at this phone interview. Patient denies shortness of breath, chest pain, fever, cough a this phone interview.  Anesthesia Review:  Hx PVT, HTN, DM2, hx pneumonectomy LLL 1967, hx colon cancer 2006.  Pt denies any cardiac s&s and no issues with PVT since 2018. His has a baseline DOE pt states since 1967 and chronic nonproductive cough.  PCP:  Mauricio Po NP Cardiologist :  Dr Desma Maxim (lov 03-30-2019 epic) Chest x-ray : 12-31-2014 epic/  06-26-2016 care everywhere EKG : 09-29-2018 care everywhere Echo : 02-08-2002  epic Cardiac Cath :  Last one 06-28-2016 care everywhere  Sleep Study/ CPAP :  NO Fasting Blood Sugar :  125-160    / Checks Blood Sugar -- times a day:   Checks 3 times weekly in am Blood Thinner/ Instructions Maurice Small Dose:  NO ASA / Instructions/ Last Dose :  ASA 81mg /  Per pt given instructions from Dr office to stop 5 days prior to surgery

## 2019-07-30 ENCOUNTER — Other Ambulatory Visit (HOSPITAL_COMMUNITY)
Admission: RE | Admit: 2019-07-30 | Discharge: 2019-07-30 | Disposition: A | Discharge status: Home / Self Care | Payer: Medicare Other | Source: Ambulatory Visit | Attending: Urology | Admitting: Urology

## 2019-07-30 DIAGNOSIS — Z01812 Encounter for preprocedural laboratory examination: Secondary | ICD-10-CM | POA: Insufficient documentation

## 2019-07-30 DIAGNOSIS — Z20822 Contact with and (suspected) exposure to covid-19: Secondary | ICD-10-CM | POA: Insufficient documentation

## 2019-07-30 LAB — SARS CORONAVIRUS 2 (TAT 6-24 HRS): SARS Coronavirus 2: NEGATIVE

## 2019-07-30 NOTE — Progress Notes (Signed)
Ekg with tracing done 09-29-2018  Received from [facility] via fax and placed on patient chart.

## 2019-08-02 NOTE — H&P (Signed)
have ureteral stone.  HPI: Herbert Evans is a 74 year-old male established patient who is here for ureteral stone.  07/14/19: Patient with below noted history. He returns today for ongoing follow-up. He denies seeing a stone pass in the interim. He continues to have some intermittent right lower quadrant pain, which is manageable. He denies significant exacerbation in voiding symptoms or difficulties voiding. No complaints of gross hematuria, dysuria, fever, chills, nausea, or vomiting. He is not on blood thinners. He denies issues with general anesthesia in the past. He does have past history of lung resection when he was a child. He does have some SOB with exertion, thought at his baseline. He is not on supplemental oxygen.   06/15/19: Patient with below noted history. He returns today for follow-up. He denies seeing stone pass in the interval. He continues to have intermittent episodes of mild right lower quadrant pain. He continues to use hydrocodone sparingly (1 tablet 2-3 days per week) for pain management. He denies any associated nausea or vomiting. He continues to have some intermittent gross hematuria, but denies clots or difficulties voiding. No significant exacerbation and lower urinary tract symptoms. No fevers or chills.   05/17/19: Herbert Evans is a 74yo WM who is sent from the ER for ureteral stones. He had the onset of pain in the right flank on 05/11/19 with nausea and vomiting. He was seen in the ER on 12/30 and a CT showed an 1m right mid ureteral stone with obstruction and a 72mright renal stone. He has mild pain in the RLQ. He has had some hematuria. He was started on tamsulosin. He is not having urgency. He had a stone in his 20's. He has had no UTI's or GU surgery. He has a round mass above the left testicle and had a scrotal [place] showed a 6.7cm left spermatocele. 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 He has a good libido.     ALLERGIES: Cholesterol Medications  Lipitor     MEDICATIONS: Metformin Hcl  Metoprolol Tartrate  Aspirin Ec 81 mg tablet, delayed release  Hydrocodone-Acetaminophen 5 mg-325 mg tablet 1 tablet PO Q 6 H PRN  Trazodone Hcl     GU PSH: None   NON-GU PSH: Cataract surgery - 2019 Partial colectomy - about 2009 Pneumonectomy, Total - about 1968     GU PMH: Renal and ureteral calculus - 06/15/2019, He has right mid ureteral and renal stones. I discussed MET, URS and ESWL and he would like to try to pass the stone. He will return in 2 weeks with a KUB. , - 05/17/2019 ED due to arterial insufficiency, He is interested in therapy and tadalafil '5mg'$  to take 2-4 po prn was dispensed. Side effects and instructions reviewed. 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 He needs no treatment at this time. - 05/17/2019 Renal calculus    NON-GU PMH: Anxiety Arrhythmia Arthritis Colon Cancer, History Diabetes Type 2 Hypercholesterolemia    FAMILY HISTORY: 2 daughters - Other 2 sons - Other father deceased - Other mother deceased - Other   SOCIAL HISTORY: Marital Status: Married Current Smoking Status: Patient has never smoked.   Tobacco Use Assessment Completed: Used Tobacco in last 30 days? Uses smokeless tobacco. Does not drink anymore.  Drinks 4+ caffeinated drinks per day. Patient's occupation is/was Retired from [employer]   REVIEW OF SYSTEMS:    GU Review Male:   Patient reports get up at night  to urinate. Patient denies frequent urination, hard to postpone urination, burning/ pain with urination, leakage of urine, stream starts and stops, trouble starting your stream, have to strain to urinate , erection problems, and penile pain.  Gastrointestinal (Upper):   Patient denies nausea, vomiting, and indigestion/ heartburn.  Gastrointestinal (Lower):   Patient denies diarrhea and constipation.  Constitutional:   Patient denies fever, night sweats, weight loss, and fatigue.  Skin:    Patient denies skin rash/ lesion and itching.  Eyes:   Patient denies double vision and blurred vision.  Ears/ Nose/ Throat:   Patient denies sore throat and sinus problems.  Hematologic/Lymphatic:   Patient denies swollen glands and easy bruising.  Cardiovascular:   Patient reports chest pains. Patient denies leg swelling.  Respiratory:   Patient reports cough and shortness of breath.   Endocrine:   Patient denies excessive thirst.  Musculoskeletal:   Patient reports back pain. Patient denies joint pain.  Neurological:   Patient reports headaches. Patient denies dizziness.  Psychologic:   Patient denies depression and anxiety.   VITAL SIGNS:      07/14/2019 03:46 PM  Weight 193 lb / 87.54 kg  Height 70 in / 177.8 cm  BP 142/80 mmHg  Heart Rate 62 /min  Temperature 98.0 F / 36.6 C  BMI 27.7 kg/m   MULTI-SYSTEM PHYSICAL EXAMINATION:    Constitutional: Well-nourished. No physical deformities. Normally developed. Good grooming.  Respiratory: No labored breathing, no use of accessory muscles. Decreased BS on the left and normal BS on the right.   Cardiovascular: Regular rate and rhythm. No murmur, no gallop. Normal temperature, normal extremity pulses, no swelling, no varicosities.   Neurologic / Psychiatric: Oriented to time, oriented to place, oriented to person. No depression, no anxiety, no agitation.  Gastrointestinal: No mass, no tenderness, no rigidity, non obese abdomen.  Musculoskeletal: Normal gait and station of head and neck.     PAST DATA REVIEWED:  Source Of History:  Patient  Records Review:   Previous Patient Records  Urine Test Review:   Urinalysis, Urine Culture  X-Ray Review: KUB: Reviewed Films.  C.T. Abdomen/Pelvis: Reviewed Films. Reviewed Report.     PROCEDURES:         KUB - [id number]  A single view of the abdomen is obtained. Bilateral renal shadows are difficult to visualize due to overlying bowel gas pattern. Along the expected anatomical course of the  right ureter there is an opacity in the pelvis. This does not appear to have progressed since prior KUB imaging. There is prominent, but normal appearance of overlying bowel gas pattern      Patient confirmed No Neulasta OnPro Device.            Renal Ultrasound - 70962  Right Kidney: Length: 11.2 cm Depth: 5.9 cm Cortical Width: 1.6 cm Width: 4.9 cm  Left Kidney: Length: 12.2 cm Depth: 6.8 cm Cortical Width: 2.0 cm Width: 5.2 cm  Left Kidney/Ureter:  Subcentimeter cyst UP.  Right Kidney/Ureter:  Cyst UP and LP. Calcification with shadowing LP.  Bladder:  PVR 46.47 ml      Patient confirmed No Neulasta OnPro Device.           Urinalysis w/Scope - 81001 Dipstick Dipstick Cont'd Micro  Color: Yellow Bilirubin: Neg WBC/hpf: 0 - 5/hpf  Appearance: Clear Ketones: Neg RBC/hpf: 0 - 2/hpf  Specific Gravity: 1.010 Blood: 1+ Bacteria: NS (Not Seen)  pH: 5.5 Protein: Neg Cystals: NS (Not Seen)  Glucose: Trace Urobilinogen: 0.2  Casts: NS (Not Seen)    Nitrites: Neg Trichomonas: Not Present    Leukocyte Esterase: Neg Mucous: Not Present      Epithelial Cells: 0 - 5/hpf      Yeast: NS (Not Seen)      Sperm: Not Present    Notes:      ASSESSMENT:      ICD-10 Details  1 GU:   Renal and ureteral calculus - N20.2 Acute, Systemic Symptoms   PLAN:           Orders Labs Urine Culture          Document Letter(s):  Created for Patient: Clinical Summary         Notes:   Urinalysis today is without infectious parameters, but precautionary culture was sent. On KUB imaging, along the expected anatomical course of the right ureter there is an opacity in the pelvis consistent with previously noted calculus. This does not appear to have progressed since prior KUB imaging. Renal ultrasound is reassuring without overt hydronephrosis. He does continue to have some intermittent right lower quadrant pain, which can be bothersome to him. We reviewed treatment options in detail today and he would  like to consider intervention with ureteroscopy. We reviewed this procedure and potential risks including bleeding, infection, ureteral injury, need for a stent or secondary procedures, thrombotic events and anesthetic complications. Ultimately, I will review today's imaging study with his urologist for recommendations moving forward. I will keep him updated regarding these recommendations. Follow-up will be pending. He will remain on medical expulsive therapy in the interim. Strict return precautions reviewed for fever or progressive pain, nausea, or vomiting.

## 2019-08-02 NOTE — Anesthesia Preprocedure Evaluation (Addendum)
Anesthesia Evaluation  Patient identified by MRN, date of birth, ID band Patient awake    Reviewed: Allergy & Precautions, NPO status , Patient's Chart, lab work & pertinent test results, reviewed documented beta blocker date and time   Airway Mallampati: II  TM Distance: >3 FB Neck ROM: Full    Dental no notable dental hx. (+) Upper Dentures, Dental Advisory Given, Poor Dentition, Missing,    Pulmonary neg pulmonary ROS,  Hx LL lobectomy 1967- has had chronic persistent dry cough since then    Pulmonary exam normal breath sounds clear to auscultation       Cardiovascular hypertension, Pt. on home beta blockers and Pt. on medications Normal cardiovascular exam+ dysrhythmias Supra Ventricular Tachycardia  Rhythm:Regular Rate:Normal  PSVT in 2018- has seen cardiology since then (07/27/19)- no issues  HLD  08/03/19 EKG-  SB w 1st degree AV block R 54   Neuro/Psych negative neurological ROS  negative psych ROS   GI/Hepatic Neg liver ROS, Hx CRC s/p sigmoid colectomy 2006   Endo/Other  negative endocrine ROSdiabetes, Type 2, Oral Hypoglycemic Agents  Renal/GU Renal diseaseRight ureteral stone, Cr 0.8 K+ 4.5  negative genitourinary   Musculoskeletal  (+) Arthritis , Osteoarthritis,    Abdominal   Peds  Hematology negative hematology ROS (+)   Anesthesia Other Findings Day of surgery medications reviewed with the patient.  Poor historian  Reproductive/Obstetrics negative OB ROS                        Anesthesia Physical Anesthesia Plan  ASA: III  Anesthesia Plan: General   Post-op Pain Management:    Induction: Intravenous  PONV Risk Score and Plan: 3 and Ondansetron, Dexamethasone and Treatment may vary due to age or medical condition  Airway Management Planned: LMA  Additional Equipment: None  Intra-op Plan:   Post-operative Plan: Extubation in OR  Informed Consent: I have  reviewed the patients History and Physical, chart, labs and discussed the procedure including the risks, benefits and alternatives for the proposed anesthesia with the patient or authorized representative who has indicated his/her understanding and acceptance.     Dental advisory given  Plan Discussed with: CRNA  Anesthesia Plan Comments:         Anesthesia Quick Evaluation

## 2019-08-03 ENCOUNTER — Other Ambulatory Visit: Payer: Self-pay

## 2019-08-03 ENCOUNTER — Encounter (HOSPITAL_BASED_OUTPATIENT_CLINIC_OR_DEPARTMENT_OTHER): Payer: Self-pay | Admitting: Urology

## 2019-08-03 ENCOUNTER — Encounter (HOSPITAL_BASED_OUTPATIENT_CLINIC_OR_DEPARTMENT_OTHER)
Admission: RE | Disposition: A | Discharge status: Home / Self Care | Payer: Self-pay | Source: Home / Self Care | Attending: Urology

## 2019-08-03 ENCOUNTER — Ambulatory Visit (HOSPITAL_BASED_OUTPATIENT_CLINIC_OR_DEPARTMENT_OTHER): Payer: Medicare Other | Admitting: Anesthesiology

## 2019-08-03 ENCOUNTER — Ambulatory Visit (HOSPITAL_BASED_OUTPATIENT_CLINIC_OR_DEPARTMENT_OTHER)
Admission: RE | Admit: 2019-08-03 | Discharge: 2019-08-03 | Disposition: A | Discharge status: Home / Self Care | Payer: Medicare Other | Attending: Urology | Admitting: Urology

## 2019-08-03 DIAGNOSIS — M1990 Unspecified osteoarthritis, unspecified site: Secondary | ICD-10-CM | POA: Diagnosis not present

## 2019-08-03 DIAGNOSIS — Z7984 Long term (current) use of oral hypoglycemic drugs: Secondary | ICD-10-CM | POA: Insufficient documentation

## 2019-08-03 DIAGNOSIS — Z888 Allergy status to other drugs, medicaments and biological substances status: Secondary | ICD-10-CM | POA: Insufficient documentation

## 2019-08-03 DIAGNOSIS — Z79899 Other long term (current) drug therapy: Secondary | ICD-10-CM | POA: Insufficient documentation

## 2019-08-03 DIAGNOSIS — N202 Calculus of kidney with calculus of ureter: Secondary | ICD-10-CM | POA: Diagnosis not present

## 2019-08-03 DIAGNOSIS — N529 Male erectile dysfunction, unspecified: Secondary | ICD-10-CM | POA: Diagnosis not present

## 2019-08-03 DIAGNOSIS — I440 Atrioventricular block, first degree: Secondary | ICD-10-CM | POA: Diagnosis not present

## 2019-08-03 DIAGNOSIS — Z7982 Long term (current) use of aspirin: Secondary | ICD-10-CM | POA: Insufficient documentation

## 2019-08-03 DIAGNOSIS — E119 Type 2 diabetes mellitus without complications: Secondary | ICD-10-CM | POA: Diagnosis not present

## 2019-08-03 DIAGNOSIS — I10 Essential (primary) hypertension: Secondary | ICD-10-CM | POA: Diagnosis not present

## 2019-08-03 DIAGNOSIS — Z9049 Acquired absence of other specified parts of digestive tract: Secondary | ICD-10-CM | POA: Insufficient documentation

## 2019-08-03 DIAGNOSIS — Z85038 Personal history of other malignant neoplasm of large intestine: Secondary | ICD-10-CM | POA: Insufficient documentation

## 2019-08-03 DIAGNOSIS — N4341 Spermatocele of epididymis, single: Secondary | ICD-10-CM | POA: Diagnosis not present

## 2019-08-03 DIAGNOSIS — Z902 Acquired absence of lung [part of]: Secondary | ICD-10-CM | POA: Diagnosis not present

## 2019-08-03 HISTORY — PX: CYSTOSCOPY WITH RETROGRADE PYELOGRAM, URETEROSCOPY AND STENT PLACEMENT: SHX5789

## 2019-08-03 HISTORY — DX: Other forms of dyspnea: R06.09

## 2019-08-03 HISTORY — DX: Acquired absence of lung (part of): Z90.2

## 2019-08-03 HISTORY — DX: Essential (primary) hypertension: I10

## 2019-08-03 HISTORY — DX: Calculus of ureter: N20.1

## 2019-08-03 HISTORY — DX: Dyspnea, unspecified: R06.00

## 2019-08-03 HISTORY — DX: Unspecified osteoarthritis, unspecified site: M19.90

## 2019-08-03 HISTORY — DX: Unspecified right bundle-branch block: I45.10

## 2019-08-03 HISTORY — DX: Personal history of other malignant neoplasm of large intestine: Z85.038

## 2019-08-03 HISTORY — DX: Presence of dental prosthetic device (complete) (partial): Z97.2

## 2019-08-03 HISTORY — DX: Ventricular tachycardia, unspecified: I47.20

## 2019-08-03 HISTORY — DX: Personal history of other infectious and parasitic diseases: Z86.19

## 2019-08-03 HISTORY — DX: Personal history of urinary calculi: Z87.442

## 2019-08-03 HISTORY — DX: Other specified postprocedural states: Z98.890

## 2019-08-03 HISTORY — DX: Chronic cough: R05.3

## 2019-08-03 HISTORY — DX: Mixed hyperlipidemia: E78.2

## 2019-08-03 HISTORY — DX: Other ventricular tachycardia: I47.29

## 2019-08-03 HISTORY — DX: Type 2 diabetes mellitus without complications: E11.9

## 2019-08-03 HISTORY — DX: Ventricular tachycardia: I47.2

## 2019-08-03 LAB — POCT I-STAT, CHEM 8
BUN: 19 mg/dL (ref 8–23)
Calcium, Ion: 1.25 mmol/L (ref 1.15–1.40)
Chloride: 99 mmol/L (ref 98–111)
Creatinine, Ser: 0.8 mg/dL (ref 0.61–1.24)
Glucose, Bld: 191 mg/dL — ABNORMAL HIGH (ref 70–99)
HCT: 45 % (ref 39.0–52.0)
Hemoglobin: 15.3 g/dL (ref 13.0–17.0)
Potassium: 4.5 mmol/L (ref 3.5–5.1)
Sodium: 137 mmol/L (ref 135–145)
TCO2: 30 mmol/L (ref 22–32)

## 2019-08-03 LAB — GLUCOSE, CAPILLARY: Glucose-Capillary: 181 mg/dL — ABNORMAL HIGH (ref 70–99)

## 2019-08-03 SURGERY — CYSTOURETEROSCOPY, WITH RETROGRADE PYELOGRAM AND STENT INSERTION
Anesthesia: General | Site: Pelvis | Laterality: Right

## 2019-08-03 MED ORDER — LACTATED RINGERS IV SOLN
INTRAVENOUS | Status: DC
  Filled 2019-08-03: qty 1000

## 2019-08-03 MED ORDER — KETOROLAC TROMETHAMINE 30 MG/ML IJ SOLN
INTRAMUSCULAR | Status: AC
  Filled 2019-08-03: qty 1

## 2019-08-03 MED ORDER — PHENAZOPYRIDINE HCL 200 MG PO TABS: 200.0000 mg | ORAL_TABLET | Freq: Three times a day (TID) | ORAL | 0 refills | Status: DC | PRN

## 2019-08-03 MED ORDER — CEFAZOLIN SODIUM-DEXTROSE 2-4 GM/100ML-% IV SOLN
2.0000 g | INTRAVENOUS | Status: AC
  Administered 2019-08-03: 2 g via INTRAVENOUS
  Filled 2019-08-03: qty 100

## 2019-08-03 MED ORDER — IOHEXOL 300 MG/ML  SOLN
INTRAMUSCULAR | Status: DC | PRN
  Administered 2019-08-03: 08:00:00 3 mL via URETHRAL

## 2019-08-03 MED ORDER — FENTANYL CITRATE (PF) 100 MCG/2ML IJ SOLN
25.0000 ug | INTRAMUSCULAR | Status: DC | PRN
  Filled 2019-08-03: qty 1

## 2019-08-03 MED ORDER — SODIUM CHLORIDE 0.9% FLUSH
3.0000 mL | Freq: Two times a day (BID) | INTRAVENOUS | Status: DC
  Filled 2019-08-03: qty 3

## 2019-08-03 MED ORDER — FENTANYL CITRATE (PF) 100 MCG/2ML IJ SOLN
INTRAMUSCULAR | Status: DC | PRN
  Administered 2019-08-03: 50 ug via INTRAVENOUS

## 2019-08-03 MED ORDER — LIDOCAINE 2% (20 MG/ML) 5 ML SYRINGE
INTRAMUSCULAR | Status: AC
  Filled 2019-08-03: qty 5

## 2019-08-03 MED ORDER — ACETAMINOPHEN 500 MG PO TABS
1000.0000 mg | ORAL_TABLET | Freq: Once | ORAL | Status: AC
  Administered 2019-08-03: 1000 mg via ORAL
  Filled 2019-08-03: qty 2

## 2019-08-03 MED ORDER — PROPOFOL 10 MG/ML IV BOLUS
INTRAVENOUS | Status: AC
  Filled 2019-08-03: qty 40

## 2019-08-03 MED ORDER — SODIUM CHLORIDE 0.9 % IR SOLN
Status: DC | PRN
  Administered 2019-08-03: 3000 mL via INTRAVESICAL

## 2019-08-03 MED ORDER — MIDAZOLAM HCL 2 MG/2ML IJ SOLN
INTRAMUSCULAR | Status: DC | PRN
  Administered 2019-08-03: 1 mg via INTRAVENOUS

## 2019-08-03 MED ORDER — DEXAMETHASONE SODIUM PHOSPHATE 10 MG/ML IJ SOLN
INTRAMUSCULAR | Status: AC
  Filled 2019-08-03: qty 1

## 2019-08-03 MED ORDER — LIDOCAINE 2% (20 MG/ML) 5 ML SYRINGE
INTRAMUSCULAR | Status: DC | PRN
  Administered 2019-08-03: 60 mg via INTRAVENOUS

## 2019-08-03 MED ORDER — KETOROLAC TROMETHAMINE 30 MG/ML IJ SOLN
INTRAMUSCULAR | Status: DC | PRN
  Administered 2019-08-03: 30 mg via INTRAVENOUS

## 2019-08-03 MED ORDER — ACETAMINOPHEN 500 MG PO TABS
ORAL_TABLET | ORAL | Status: AC
  Filled 2019-08-03: qty 2

## 2019-08-03 MED ORDER — DEXAMETHASONE SODIUM PHOSPHATE 10 MG/ML IJ SOLN
INTRAMUSCULAR | Status: DC | PRN
  Administered 2019-08-03: 5 mg via INTRAVENOUS

## 2019-08-03 MED ORDER — FENTANYL CITRATE (PF) 100 MCG/2ML IJ SOLN
INTRAMUSCULAR | Status: AC
  Filled 2019-08-03: qty 2

## 2019-08-03 MED ORDER — CEFAZOLIN SODIUM-DEXTROSE 2-4 GM/100ML-% IV SOLN
INTRAVENOUS | Status: AC
  Filled 2019-08-03: qty 100

## 2019-08-03 MED ORDER — HYDROCODONE-ACETAMINOPHEN 5-325 MG PO TABS: 1.0000 | ORAL_TABLET | Freq: Four times a day (QID) | ORAL | 0 refills | Status: DC | PRN

## 2019-08-03 MED ORDER — MIDAZOLAM HCL 2 MG/2ML IJ SOLN
INTRAMUSCULAR | Status: AC
  Filled 2019-08-03: qty 2

## 2019-08-03 MED ORDER — ONDANSETRON HCL 4 MG/2ML IJ SOLN
4.0000 mg | Freq: Once | INTRAMUSCULAR | Status: AC | PRN
  Administered 2019-08-03: 08:00:00 4 mg via INTRAVENOUS
  Filled 2019-08-03: qty 2

## 2019-08-03 MED ORDER — PROPOFOL 10 MG/ML IV BOLUS
INTRAVENOUS | Status: DC | PRN
  Administered 2019-08-03: 140 mg via INTRAVENOUS

## 2019-08-03 MED ORDER — ONDANSETRON HCL 4 MG/2ML IJ SOLN
INTRAMUSCULAR | Status: AC
  Filled 2019-08-03: qty 2

## 2019-08-03 SURGICAL SUPPLY — 28 items
BAG DRAIN URO-CYSTO SKYTR STRL (DRAIN) ×3 IMPLANT
BAG DRN UROCATH (DRAIN) ×1
BASKET STONE 1.7 NGAGE (UROLOGICAL SUPPLIES) ×2 IMPLANT
BASKET ZERO TIP NITINOL 2.4FR (BASKET) IMPLANT
BSKT STON RTRVL ZERO TP 2.4FR (BASKET)
CATH URET 5FR 28IN CONE TIP (BALLOONS)
CATH URET 5FR 28IN OPEN ENDED (CATHETERS) ×2 IMPLANT
CATH URET 5FR 70CM CONE TIP (BALLOONS) IMPLANT
CLOTH BEACON ORANGE TIMEOUT ST (SAFETY) ×3 IMPLANT
DRSG TEGADERM 2-3/8X2-3/4 SM (GAUZE/BANDAGES/DRESSINGS) ×2 IMPLANT
ELECT REM PT RETURN 9FT ADLT (ELECTROSURGICAL)
ELECTRODE REM PT RTRN 9FT ADLT (ELECTROSURGICAL) IMPLANT
FIBER LASER FLEXIVA 365 (UROLOGICAL SUPPLIES) IMPLANT
FIBER LASER TRAC TIP (UROLOGICAL SUPPLIES) ×2 IMPLANT
GLOVE SURG SS PI 8.0 STRL IVOR (GLOVE) ×3 IMPLANT
GOWN STRL REUS W/TWL XL LVL3 (GOWN DISPOSABLE) ×3 IMPLANT
GUIDEWIRE ANG ZIPWIRE 038X150 (WIRE) IMPLANT
GUIDEWIRE STR DUAL SENSOR (WIRE) ×3 IMPLANT
IV NS IRRIG 3000ML ARTHROMATIC (IV SOLUTION) ×3 IMPLANT
KIT TURNOVER CYSTO (KITS) ×3 IMPLANT
MANIFOLD NEPTUNE II (INSTRUMENTS) ×3 IMPLANT
NS IRRIG 500ML POUR BTL (IV SOLUTION) ×3 IMPLANT
PACK CYSTO (CUSTOM PROCEDURE TRAY) ×3 IMPLANT
SHEATH URETERAL 12FRX55CM (UROLOGICAL SUPPLIES) ×2 IMPLANT
STENT URET 6FRX26 CONTOUR (STENTS) ×2 IMPLANT
TUBE CONNECTING 12'X1/4 (SUCTIONS) ×1
TUBE CONNECTING 12X1/4 (SUCTIONS) ×2 IMPLANT
TUBING UROLOGY SET (TUBING) IMPLANT

## 2019-08-03 NOTE — Interval H&P Note (Signed)
History and Physical Interval Note:  08/03/2019 7:17 AM  Herbert Evans  has presented today for surgery, with the diagnosis of RIGHT URETERAL STONE.  The various methods of treatment have been discussed with the patient and family. After consideration of risks, benefits and other options for treatment, the patient has consented to  Procedure(s): CYSTOSCOPY WITH RIGHT  RETROGRADE PYELOGRAM, URETEROSCOPY HOLMIUMM LASER AND STENT PLACEMENT (Right) as a surgical intervention.  The patient's history has been reviewed, patient examined, no change in status, stable for surgery.  I have reviewed the patient's chart and labs.  Questions were answered to the patient's satisfaction.     Bjorn Pippin

## 2019-08-03 NOTE — Discharge Instructions (Addendum)
Ureteral Stent Implantation, Care After This sheet gives you information about how to care for yourself after your procedure. Your health care provider may also give you more specific instructions. If you have problems or questions, contact your health care provider. What can I expect after the procedure? After the procedure, it is common to have:  Nausea.  Mild pain when you urinate. You may feel this pain in your lower back or lower abdomen. The pain should stop within a few minutes after you urinate. This may last for up to 1 week.  A small amount of blood in your urine for several days. Follow these instructions at home: Medicines  Take over-the-counter and prescription medicines only as told by your health care provider.  If you were prescribed an antibiotic medicine, take it as told by your health care provider. Do not stop taking the antibiotic even if you start to feel better.  Do not drive for 24 hours if you were given a sedative during your procedure.  Ask your health care provider if the medicine prescribed to you requires you to avoid driving or using heavy machinery. Activity  Rest as told by your health care provider.  Avoid sitting for a long time without moving. Get up to take short walks every 1-2 hours. This is important to improve blood flow and breathing. Ask for help if you feel weak or unsteady.  Return to your normal activities as told by your health care provider. Ask your health care provider what activities are safe for you. General instructions   Watch for any blood in your urine. Call your health care provider if the amount of blood in your urine increases.  If you have a catheter: ? Follow instructions from your health care provider about taking care of your catheter and collection bag. ? Do not take baths, swim, or use a hot tub until your health care provider approves. Ask your health care provider if you may take showers. You may only be allowed to  take sponge baths.  Drink enough fluid to keep your urine pale yellow.  Do not use any products that contain nicotine or tobacco, such as cigarettes, e-cigarettes, and chewing tobacco. These can delay healing after surgery. If you need help quitting, ask your health care provider.  Keep all follow-up visits as told by your health care provider. This is important. Contact a health care provider if:  You have pain that gets worse or does not get better with medicine, especially pain when you urinate.  You have difficulty urinating.  You feel nauseous or you vomit repeatedly during a period of more than 2 days after the procedure. Get help right away if:  Your urine is dark red or has blood clots in it.  You are leaking urine (have incontinence).  The end of the stent comes out of your urethra.  You cannot urinate.  You have sudden, sharp, or severe pain in your abdomen or lower back.  You have a fever.  You have swelling or pain in your legs.  You have difficulty breathing. Summary  After the procedure, it is common to have mild pain when you urinate that goes away within a few minutes after you urinate. This may last for up to 1 week.  Watch for any blood in your urine. Call your health care provider if the amount of blood in your urine increases.  Take over-the-counter and prescription medicines only as told by your health care provider.  Drink   enough fluid to keep your urine pale yellow.  You may remove the stent by pulling the string on Friday morning and if you don't feel you can do that, please call the office to come have it removed.  Please bring the stone fragments to the office for analysis.  This information is not intended to replace advice given to you by your health care provider. Make sure you discuss any questions you have with your health care provider. Document Revised: 02/03/2018 Document Reviewed: 02/04/2018 Elsevier Patient Education  2020 [employer].   Post Anesthesia Home Care Instructions  Activity: Get plenty of rest for the remainder of the day. A responsible individual must stay with you for 24 hours following the procedure.  For the next 24 hours, DO NOT: -Drive a car -Advertising copywriter -Drink alcoholic beverages -Take any medication unless instructed by your physician -Make any legal decisions or sign important papers.  Meals: Start with liquid foods such as gelatin or soup. Progress to regular foods as tolerated. Avoid greasy, spicy, heavy foods. If nausea and/or vomiting occur, drink only clear liquids until the nausea and/or vomiting subsides. Call your physician if vomiting continues.  Special Instructions/Symptoms: Your throat may feel dry or sore from the anesthesia or the breathing tube placed in your throat during surgery. If this causes discomfort, gargle with warm salt water. The discomfort should disappear within 24 hours.  If you had a scopolamine patch placed behind your ear for the management of post- operative nausea and/or vomiting:  1. The medication in the patch is effective for 72 hours, after which it should be removed.  Wrap patch in a tissue and discard in the trash. Wash hands thoroughly with soap and water. 2. You may remove the patch earlier than 72 hours if you experience unpleasant side effects which may include dry mouth, dizziness or visual disturbances. 3. Avoid touching the patch. Wash your hands with soap and water after contact with the patch.

## 2019-08-03 NOTE — Anesthesia Procedure Notes (Signed)
Procedure Name: LMA Insertion Date/Time: 08/03/2019 7:30 AM Performed by: Tyrone Nine, CRNA Pre-anesthesia Checklist: Patient identified, Emergency Drugs available, Suction available, Patient being monitored and Timeout performed Patient Re-evaluated:Patient Re-evaluated prior to induction Oxygen Delivery Method: Circle system utilized Induction Type: IV induction Ventilation: Mask ventilation without difficulty LMA Size: 5.0 Number of attempts: 1 Placement Confirmation: breath sounds checked- equal and bilateral,  CO2 detector and positive ETCO2 Tube secured with: Tape Dental Injury: Teeth and Oropharynx as per pre-operative assessment

## 2019-08-03 NOTE — Anesthesia Postprocedure Evaluation (Signed)
Anesthesia Post Note  Patient: Herbert Evans  Procedure(s) Performed: CYSTOSCOPY WITH RIGHT  RETROGRADE PYELOGRAM, URETEROSCOPY HOLMIUMM LASER AND STENT PLACEMENT (Right Pelvis)     Patient location during evaluation: PACU Anesthesia Type: General Level of consciousness: awake and alert Pain management: pain level controlled Vital Signs Assessment: post-procedure vital signs reviewed and stable Respiratory status: spontaneous breathing, nonlabored ventilation, respiratory function stable and patient connected to nasal cannula oxygen Cardiovascular status: blood pressure returned to baseline and stable Postop Assessment: no apparent nausea or vomiting Anesthetic complications: no    Last Vitals:  Vitals:   08/03/19 0840 08/03/19 0845  BP:  133/61  Pulse: (!) 56 60  Resp: 15 18  Temp:    SpO2: 97% 96%    Last Pain:  Vitals:   08/03/19 0840  TempSrc:   PainSc: 0-No pain                 Trevor Iha

## 2019-08-03 NOTE — Transfer of Care (Signed)
Immediate Anesthesia Transfer of Care Note  Patient: Herbert Evans  Procedure(s) Performed: CYSTOSCOPY WITH RIGHT  RETROGRADE PYELOGRAM, URETEROSCOPY HOLMIUMM LASER AND STENT PLACEMENT (Right Pelvis)  Patient Location: PACU  Anesthesia Type:General  Level of Consciousness: awake, alert , oriented and patient cooperative  Airway & Oxygen Therapy: Patient Spontanous Breathing and Patient connected to nasal cannula oxygen  Post-op Assessment: Report given to RN and Post -op Vital signs reviewed and stable  Post vital signs: Reviewed and stable  Last Vitals:  Vitals Value Taken Time  BP    Temp    Pulse    Resp    SpO2      Last Pain:  Vitals:   08/03/19 0601  TempSrc: Oral  PainSc: 0-No pain      Patients Stated Pain Goal: 7 (08/03/19 0601)  Complications: No apparent anesthesia complications

## 2019-08-03 NOTE — Op Note (Signed)
Procedure: 1.  Cystoscopy with Right Retrograde Pyelogram and interpretation. 2.  Right ureteroscopic stone extraction with holmium laser application for right distal ureteral stones. 3.  Right ureteroscopic stone extraction with holmium laser application and insertion of right double-J stent for right lower pole stone. 4.  Application of fluoroscopy.  Preop diagnosis: Right distal ureteral and right lower pole renal calculi.  Postop diagnosis: Same.  Surgeon: Dr. Irine Seal.  Anesthesia: General.  Specimen: Stone fragments.  Drain: 6 [place] by 26 cm right contour double-J stent with tether.  EBL: None.  Complications: None.  Indications: Herbert Evans is a 74 year old male who has a approximately 8 mm right distal ureteral stone and a 6 mm right lower pole stone.  The distal stone has not progressed since January and he is elected ureteroscopy for management.  Procedure: He was given 2 g of Ancef.  A general anesthetic was induced.  He was placed in lithotomy position and fitted with PAS hose.  His perineum and genitalia were prepped with Betadine solution he was draped in usual sterile fashion.  Cystoscopy was performed using a 23 [place] scope and 30 degree lens.  Examination revealed a normal urethra.  The external sphincter was intact.  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  Examination of bladder revealed mild trabeculation.  No mucosal lesions were identified.  Ureteral orifices were unremarkable.  The Right  orifice was cannulated with 5 French opening catheter and contrast was instilled.  Right retrograde pyelogram demonstrated a normal caliber ureter with a filling defect in a slightly dilated distal ureter consistent with a stone.  No hydronephrosis was identified.  A sensor wire was passed through the open-ended catheter to the kidney and the opening catheter and cystoscope removed.  A 12 French digital access sheath inner core  was passed over the wire and the distal ureter was easily dilated.  A 6.5 French semirigid ureteroscope was then advanced per urethra alongside the wire to the stone in the right distal ureter.  A 200 m tract tip laser fiber was then passed and set on 0.8 J and 10 Hz.  The stone was then broken into manageable fragments and removed with an engage basket to the bladder.  The ureteroscope was removed and the assembled 55 cm 12/14 French digital access sheath was advanced to the level of the UPJ.  The inner core and wire were then removed.  The dual-lumen digital flexible ureteroscope was then advanced to the kidney and the lower pole stone was identified.  A treatment it was relocated using an engage basket into the mid pole.  The stone was then fragmented using the 200 m tract tip laser fiber with a power setting of 0.8 J and a frequency of 20 Hz.  The stone readily fragmented and the fragments were then removed with the engage basket.  Once visual inspection confirmed removal of all significant fragments, the basket was removed and the sensor wire was advanced to the kidney through the scope.  The scope was then removed along with the access sheath with visual inspection of the ureter throughout removal.  No significant ureteral mucosal tears or injuries were identified.  The cystoscope was then reinserted over the wire and a 6 [place] by 26 cm contour double-J stent with tether was passed to the kidney under fluoroscopic guidance.  The wire was removed leaving good coil in the kidney and a good coil in the bladder.  The cystoscope was removed leaving  the stent string exiting the urethra and then was reinserted alongside the string.  The stone fragments from the initial ureteroscopy were then removed and the bladder was drained.  The cystoscope was removed and the stent string was secured to the patient's penis.  He was taken down from lithotomy position, his anesthetic was reversed and he was moved  recovery in stable condition.  There were no complications.  The stones were given to his family to bring to the office at follow-up.

## 2019-08-10 ENCOUNTER — Ambulatory Visit: Payer: Medicare Other | Attending: Internal Medicine

## 2019-08-10 DIAGNOSIS — Z23 Encounter for immunization: Secondary | ICD-10-CM

## 2019-08-10 NOTE — Progress Notes (Signed)
   Covid-19 Vaccination Clinic  Name:  Herbert Evans    MRN: [id number] DOB: 1946/03/22  08/10/2019  [name] was observed post Covid-19 immunization for 15 minutes without incident. He was provided with Vaccine Information Sheet and instruction to access the V-Safe system.   [name] was instructed to call 911 with any severe reactions post vaccine: Marland Kitchen Difficulty breathing  . Swelling of face and throat  . A fast heartbeat  . A bad rash all over body  . Dizziness and weakness   Immunizations Administered    Name Date Dose VIS Date Route   Pfizer COVID-19 Vaccine 08/10/2019 12:49 PM 0.3 mL 04/23/2019 Intramuscular   Manufacturer: [employer], [employer]   Lot: [id number]   NDC: [address]-0

## 2019-08-30 ENCOUNTER — Encounter: Payer: Self-pay | Admitting: *Deleted

## 2019-08-30 ENCOUNTER — Ambulatory Visit: Payer: Medicare Other | Admitting: Cardiology

## 2019-08-30 ENCOUNTER — Other Ambulatory Visit: Payer: Self-pay

## 2019-08-30 VITALS — BP 106/70 | HR 55 | Ht 71.0 in | Wt 191.0 lb

## 2019-08-30 DIAGNOSIS — Z79899 Other long term (current) drug therapy: Secondary | ICD-10-CM | POA: Diagnosis not present

## 2019-08-30 DIAGNOSIS — E118 Type 2 diabetes mellitus with unspecified complications: Secondary | ICD-10-CM | POA: Insufficient documentation

## 2019-08-30 DIAGNOSIS — E782 Mixed hyperlipidemia: Secondary | ICD-10-CM | POA: Diagnosis not present

## 2019-08-30 MED ORDER — EZETIMIBE 10 MG PO TABS: 10.0000 mg | ORAL_TABLET | Freq: Every day | ORAL | 3 refills | Status: DC

## 2019-08-30 MED ORDER — CO-ENZYME Q-10 50 MG PO CAPS: 50.0000 mg | ORAL_CAPSULE | Freq: Every day | ORAL | 3 refills | Status: DC

## 2019-08-30 MED ORDER — PITAVASTATIN CALCIUM 2 MG PO TABS: 2.0000 mg | ORAL_TABLET | Freq: Every day | ORAL | 3 refills | Status: DC

## 2019-08-30 NOTE — Progress Notes (Addendum)
Cardiology Office Note:    Date:  08/30/2019   ID:  Herbert Evans, DOB [birth year], MRN [id number]  PCP:  Imagene Riches, NP  Cardiologist:  Berniece Salines, DO  Electrophysiologist:  None   Referring MD: Imagene Riches, NP   Chief Complaint  Patient presents with  . Hyperlipidemia   History of Present Illness:    Herbert Evans is a 74 y.o. male with a hx of hyperlipidemia statin intolerant, history of ventricular tachycardia on beta-blocker, the patient presents to be evaluated for hyperlipidemia.  He is here with his wife.  The patient his wife will tells me that he has been having difficulty controlling his high cholesterol due to the fact that he has been intolerant to statins namely Crestor and Lipitor.  He was started on PCSK9 inhibitors but he was not able to take this medication as insurance could not cover it and it was not affordable to him.  He is willing to work with taking medications however cost is also a big issue. He denies any chest pain, shortness of breath, nausea, vomiting.   Past Medical History:  Diagnosis Date  . Arthritis    back, hands  . 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 DOE (dyspnea on exertion)   . History of colon cancer    03-01-2005   s/p  sigmoid colectomy w/ negative nodes  (07-27-2019  per pt last colonoscopy 2019 , no recurrence)  . History of Helicobacter pylori infection    09/ 2006  . History of kidney stones   . 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  . Hypertension   . Mixed hyperlipidemia   . Paroxysmal ventricular tachycardia [facility])    cardiologist--- dr Beatrix Fetters    (07-27-2019  per pt no issues or s&s since 2018)  takes BB  . RBBB (right bundle branch block)   . Right ureteral stone   . Type 2 diabetes mellitus ([place])    followed by pcp --- (07-27-2019  checks blood sugar three times per week in am,  fasting sugar-- 125-160)  . Wears dentures    upper    Past Surgical History:  Procedure Laterality Date  . CARDIAC CATHETERIZATION  02-09-2002   dr Terrence Dupont  '@[facility]'$    ef 50-55% w/ 10-15% LAD  . CARDIAC CATHETERIZATION  06-28-2016   dr cheek '@[facility]'$    for arrhythmia)--- normal coronaries, mild focal wall motion abnormallity of distal inferapical wall,  severe right innominate / subclavin tortuosity  . CATARACT EXTRACTION W/ INTRAOCULAR LENS  IMPLANT, BILATERAL  2018  . COLON RESECTION SIGMOID  03-01-2005   '@[facility]'$   . CYSTOSCOPY WITH RETROGRADE PYELOGRAM, URETEROSCOPY AND STENT PLACEMENT Right 08/03/2019   Procedure: CYSTOSCOPY WITH RIGHT  RETROGRADE PYELOGRAM, URETEROSCOPY [place];  Surgeon: Irine Seal, MD;  Location[facility];  Service: Urology;  Laterality: Right;  . CYSTOSCOPY/RETROGRADE/URETEROSCOPY/STONE EXTRACTION WITH BASKET  1970s  . LUNG REMOVAL, PARTIAL Left 1967   lower lobe for pneumonia per pt  . TONSILLECTOMY AND ADENOIDECTOMY  child    Current Medications: Current Meds  Medication Sig  . aspirin EC 81 MG tablet Take 81 mg by mouth every morning.  . Aspirin-Salicylamide-Caffeine (BC FAST PAIN RELIEF) 650-195-33.3 MG PACK Take 1 Package by mouth daily as needed (mild pain).  Marland Kitchen HYDROcodone-acetaminophen (NORCO/VICODIN) 5-325 MG tablet Take 1-2 tablets by  mouth every 6 (six) hours as needed for moderate pain.  . metFORMIN (GLUCOPHAGE) 1000 MG tablet Take 1,000 mg by mouth 2 (two) times daily with a meal.   . metoprolol succinate (TOPROL-XL) 100 MG 24 hr tablet Take 100 mg by mouth daily. Take with or immediately following a meal.   . ondansetron (ZOFRAN ODT) 4 MG disintegrating tablet Take 1 tablet (4 mg total) by mouth every 8 (eight) hours as needed for nausea or vomiting.  . traZODone (DESYREL) 100 MG tablet Take 100 mg by mouth at bedtime.  . [DISCONTINUED] traZODone (DESYREL) 50 MG tablet Take 50 mg by mouth at bedtime.     Allergies:   Atorvastatin and Rosuvastatin   Social  History   Socioeconomic History  . Marital status: Married    Spouse name: Not on file  . Number of children: Not on file  . Years of education: Not on file  . Highest education level: Not on file  Occupational History  . Not on file  Tobacco Use  . Smoking status: Never Smoker  . Smokeless tobacco: Current User    Types: Snuff  . Tobacco comment: 07-27-2019  dip tobacco since age 89  Substance and Sexual Activity  . Alcohol use: No  . Drug use: Never  . Sexual activity: Not on file  Other Topics Concern  . Not on file  Social History Narrative  . Not on file   Social Determinants of Health   Financial Resource Strain:   . Difficulty of Paying Living Expenses:   Food Insecurity:   . Worried About Charity fundraiser in the Last Year:   . Arboriculturist in the Last Year:   Transportation Needs:   . Film/video editor (Medical):   Marland Kitchen Lack of Transportation (Non-Medical):   Physical Activity:   . Days of Exercise per Week:   . Minutes of Exercise per Session:   Stress:   . Feeling of Stress :   Social Connections:   . Frequency of Communication with Friends and Family:   . Frequency of Social Gatherings with Friends and Family:   . Attends Religious Services:   . Active Member of Clubs or Organizations:   . Attends Archivist Meetings:   Marland Kitchen Marital Status:      Family History: The patient's family history includes Asthma in his mother; Heart attack in his mother; Lung cancer in his sister, sister, and sister; Stroke in his father.  ROS:   Review of Systems  Constitution: Negative for decreased appetite, fever and weight gain.  HENT: Negative for congestion, ear discharge, hoarse voice and sore throat.   Eyes: Negative for discharge, redness, vision loss in right eye and visual halos.  Cardiovascular: Negative for chest pain, dyspnea on exertion, leg swelling, orthopnea and palpitations.  Respiratory: Negative for cough, hemoptysis, shortness of  breath and snoring.   Endocrine: Negative for heat intolerance and polyphagia.  Hematologic/Lymphatic: Negative for bleeding problem. Does not bruise/bleed easily.  Skin: Negative for flushing, nail changes, rash and suspicious lesions.  Musculoskeletal: Negative for arthritis, joint pain, muscle cramps, myalgias, neck pain and stiffness.  Gastrointestinal: Negative for abdominal pain, bowel incontinence, diarrhea and excessive appetite.  Genitourinary: Negative for decreased libido, genital sores and incomplete emptying.  Neurological: Negative for brief paralysis, focal weakness, headaches and loss of balance.  Psychiatric/Behavioral: Negative for altered mental status, depression and suicidal ideas.  Allergic/Immunologic: Negative for HIV exposure and persistent infections.    EKGs/Labs/Other  Studies Reviewed:    The following studies were reviewed today:   EKG:  The ekg ordered today demonstrates bradycardia, heart rate 56 bpm with first-degree AV block.  Left heart catheterization in 2018 Normal coronary arteries. Mild focal wall motion abnormality of distal inferapical wall Note: severe R innominate/subclavian tortuosity; angiography difficult; consider L radial or femoral access in future I have reviewed the recent history and physical documentation. I personally spent 30 minutes continuously monitoring the patient during the administration of moderate sedation. Pre and post activities have been reviewed. I was present for the entire procedure.  Signatures  Electronically signed by Alinda Money, MD, FACC(Diagnostic  Physician) on 07/14/2016 16:51  Angiographic findings cardiac Arteries and Lesion Findings LMCA: 0%. LAD: 0%. LCx: 0%. RCA: 0%. Ramus: 0%.   Recent Labs: 05/12/2019: ALT 19; Platelets 166 08/03/2019: BUN 19; Creatinine, Ser 0.80; Hemoglobin 15.3; Potassium 4.5; Sodium 137  Chemistry done every 10/31/2019: Sodium 134, potassium 4.5, chloride 98, bicarb  28, BUN 10, creatinine 0.98, glucose 195, calcium 9.8, total protein 7.0, albumin 4.2, total bili 0.9, alk phos 67, AST 16, ALT 18, TSH 2.589 CBC: WBC 7.1, hemoglobin 15.4, hematocrit 45.3, platelet 57 Recent Lipid Panel Lipid profile done in August 17, 2019 with his PCP showed that his LDL was 185, HDL 42, total cholesterol 234, triglyceride 106.  Physical Exam:    VS:  BP 106/70 (BP Location: Left Arm, Patient Position: Sitting, Cuff Size: Normal)   Pulse (!) 55   Ht '5\' 11"'$  (1.803 m)   Wt 191 lb (86.6 kg)   SpO2 97%   BMI 26.64 kg/m     Wt Readings from Last 3 Encounters:  08/30/19 191 lb (86.6 kg)  08/03/19 192 lb (87.1 kg)     GEN: Well nourished, well developed in no acute distress HEENT: Normal NECK: No JVD; No carotid bruits LYMPHATICS: No lymphadenopathy CARDIAC: S1S2 noted,RRR, no murmurs, rubs, gallops RESPIRATORY:  Clear to auscultation without rales, wheezing or rhonchi  ABDOMEN: Soft, non-tender, non-distended, +bowel sounds, no guarding. EXTREMITIES: No edema, No cyanosis, no clubbing MUSCULOSKELETAL:  No deformity  SKIN: Warm and dry NEUROLOGIC:  Alert and oriented x 3, non-focal PSYCHIATRIC:  Normal affect, good insight  ASSESSMENT:    1. Medication management   2. Mixed hyperlipidemia   3. Type 2 diabetes mellitus with complication, without long-term current use of insulin (HCC)    PLAN:    He does have hyperlipidemia in the setting of his diabetes and this probably is also familiar.  Thankfully no coronary artery disease seen on his 2018 heart catheterization.  He had been intolerant to Lipitor as well as respiratory.  I talked to the patient and his wife about management.  My plan is to start the patient on pitavastatin 2 mg daily, Zetia 10 mg daily and coenzyme Q 10.  Hopefully will be able to get some results with this.  We will repeat his lipid profile in 8 weeks.  For today we will get liver function test as well as total CK.  Diabetes type 2-continue  patient on his current medication regimen.  Blood work will be done today for his liver function tests and total CK.  The patient is in agreement with the above plan. The patient left the office in stable condition.  The patient will follow up in 3 months or sooner if needed.  Medication Adjustments/Labs and Tests Ordered: Current medicines are reviewed at length with the patient today.  Concerns regarding medicines are outlined  above.  Orders Placed This Encounter  Procedures  . CK Total (and CKMB)  . Hepatic function panel  . Lipid panel   Meds ordered this encounter  Medications  . co-enzyme Q-10 50 MG capsule    Sig: Take 1 capsule (50 mg total) by mouth daily.    Dispense:  90 capsule    Refill:  3  . ezetimibe (ZETIA) 10 MG tablet    Sig: Take 1 tablet (10 mg total) by mouth daily.    Dispense:  90 tablet    Refill:  3  . Pitavastatin Calcium 2 MG TABS    Sig: Take 1 tablet (2 mg total) by mouth daily.    Dispense:  90 tablet    Refill:  3    Patient Instructions  Medication Instructions:  1) Start Ezetimibe (Zetia) 10 mg daily  2) Start Co-Enzyme 50 mg once daily   3) Start Pitavastatin 2 mg once  Daily   *If you need a refill on your cardiac medications before your next appointment, please call your pharmacy*   Lab Work: Hepatic function test and Total CK today   Your physician recommends that you return for lab work in: 8 weeks   If you have labs (blood work) drawn today and your tests are completely normal, you will receive your results only by: Marland Kitchen MyChart Message (if you have MyChart) OR . A paper copy in the mail If you have any lab test that is abnormal or we need to change your treatment, we will call you to review the results.   Testing/Procedures: None ordered    Follow-Up: At [facility], you and your health needs are our priority.  As part of our continuing mission to provide you with exceptional heart care, we have created designated  Provider Care Teams.  These Care Teams include your primary Cardiologist (physician) and Advanced Practice Providers (APPs -  Physician Assistants and Nurse Practitioners) who all work together to provide you with the care you need, when you need it.  We recommend signing up for the patient portal called "MyChart".  Sign up information is provided on this After Visit Summary.  MyChart is used to connect with patients for Virtual Visits (Telemedicine).  Patients are able to view lab/test results, encounter notes, upcoming appointments, etc.  Non-urgent messages can be sent to your provider as well.   To learn more about what you can do with MyChart, go to [email or web address].    Your next appointment:   3 month(s)  The format for your next appointment:   In Person  Provider:   Berniece Salines, DO   Other Instructions None      Adopting a Healthy Lifestyle.  Know what a healthy weight is for you (roughly BMI <25) and aim to maintain this   Aim for 7+ servings of fruits and vegetables daily   65-80+ fluid ounces of water or unsweet tea for healthy kidneys   Limit to max 1 drink of alcohol per day; avoid smoking/tobacco   Limit animal fats in diet for cholesterol and heart health - choose grass fed whenever available   Avoid highly processed foods, and foods high in saturated/trans fats   Aim for low stress - take time to unwind and care for your mental health   Aim for 150 min of moderate intensity exercise weekly for heart health, and weights twice weekly for bone health   Aim for 7-9 hours of sleep daily   When it comes  to diets, agreement about the perfect plan isnt easy to find, even among the experts. Experts at the [place] developed an idea known as the Healthy Eating Plate. Just imagine a plate divided into logical, healthy portions.   The emphasis is on diet quality:   Load up on vegetables and fruits - one-half of your plate: Aim for color  and variety, and remember that potatoes dont count.   Go for whole grains - one-quarter of your plate: Whole wheat, barley, wheat berries, quinoa, oats, brown rice, and foods made with them. If you want pasta, go with whole wheat pasta.   Protein power - one-quarter of your plate: Fish, chicken, beans, and nuts are all healthy, versatile protein sources. Limit red meat.   The diet, however, does go beyond the plate, offering a few other suggestions.   Use healthy plant oils, such as olive, canola, soy, corn, sunflower and peanut. Check the labels, and avoid partially hydrogenated oil, which have unhealthy trans fats.   If youre thirsty, drink water. Coffee and tea are good in moderation, but skip sugary drinks and limit milk and dairy products to one or two daily servings.   The type of carbohydrate in the diet is more important than the amount. Some sources of carbohydrates, such as vegetables, fruits, whole grains, and beans-are healthier than others.   Finally, stay active  Signed, Berniece Salines, DO  08/30/2019 11:22 AM    [place]

## 2019-08-30 NOTE — Patient Instructions (Addendum)
Medication Instructions:  1) Start Ezetimibe (Zetia) 10 mg daily  2) Start Co-Enzyme 50 mg once daily   3) Start Pitavastatin 2 mg once  Daily   *If you need a refill on your cardiac medications before your next appointment, please call your pharmacy*   Lab Work: Hepatic function test and Total CK today   Your physician recommends that you return for lab work in: 8 weeks   If you have labs (blood work) drawn today and your tests are completely normal, you will receive your results only by: Marland Kitchen MyChart Message (if you have MyChart) OR . A paper copy in the mail If you have any lab test that is abnormal or we need to change your treatment, we will call you to review the results.   Testing/Procedures: None ordered    Follow-Up: At [facility], you and your health needs are our priority.  As part of our continuing mission to provide you with exceptional heart care, we have created designated Provider Care Teams.  These Care Teams include your primary Cardiologist (physician) and Advanced Practice Providers (APPs -  Physician Assistants and Nurse Practitioners) who all work together to provide you with the care you need, when you need it.  We recommend signing up for the patient portal called "MyChart".  Sign up information is provided on this After Visit Summary.  MyChart is used to connect with patients for Virtual Visits (Telemedicine).  Patients are able to view lab/test results, encounter notes, upcoming appointments, etc.  Non-urgent messages can be sent to your provider as well.   To learn more about what you can do with MyChart, go to [email or web address].    Your next appointment:   3 month(s)  The format for your next appointment:   In Person  Provider:   Thomasene Ripple, DO   Other Instructions None

## 2019-08-31 ENCOUNTER — Telehealth: Payer: Self-pay

## 2019-08-31 LAB — HEPATIC FUNCTION PANEL
ALT: 11 IU/L (ref 0–44)
AST: 17 IU/L (ref 0–40)
Albumin: 4.6 g/dL (ref 3.7–4.7)
Alkaline Phosphatase: 74 IU/L (ref 39–117)
Bilirubin Total: 0.4 mg/dL (ref 0.0–1.2)
Bilirubin, Direct: 0.13 mg/dL (ref 0.00–0.40)
Total Protein: 7.5 g/dL (ref 6.0–8.5)

## 2019-08-31 LAB — CK TOTAL AND CKMB (NOT AT ARMC)
CK-MB Index: 1.6 ng/mL (ref 0.0–10.4)
Total CK: 51 U/L (ref 41–331)

## 2019-08-31 NOTE — Telephone Encounter (Signed)
-----   Message from Thomasene Ripple, DO sent at 08/31/2019  8:14 AM EDT ----- Labs normal.  Please remind the patient that he will need to get a repeat lipid profile in 8 weeks.  Let him know no appointment is necessary he can walk-in as long as he is fasting.

## 2019-08-31 NOTE — Telephone Encounter (Signed)
Spoke with patient regarding results and recommendation.  Patient verbalizes understanding and is agreeable to plan of care. Advised patient to call back with any issues or concerns.  

## 2019-08-31 NOTE — Addendum Note (Signed)
Addended by: Virl Axe, Terisa Belardo L on: 08/31/2019 09:43 AM   Modules accepted: Orders

## 2019-11-25 ENCOUNTER — Ambulatory Visit: Payer: Medicare Other | Admitting: Cardiology

## 2019-11-25 ENCOUNTER — Encounter: Payer: Self-pay | Admitting: Cardiology

## 2019-11-25 ENCOUNTER — Other Ambulatory Visit: Payer: Self-pay

## 2019-11-25 VITALS — BP 110/68 | HR 70 | Ht 70.0 in | Wt 193.4 lb

## 2019-11-25 DIAGNOSIS — E118 Type 2 diabetes mellitus with unspecified complications: Secondary | ICD-10-CM

## 2019-11-25 DIAGNOSIS — R0602 Shortness of breath: Secondary | ICD-10-CM | POA: Diagnosis not present

## 2019-11-25 DIAGNOSIS — E782 Mixed hyperlipidemia: Secondary | ICD-10-CM | POA: Diagnosis not present

## 2019-11-25 NOTE — Patient Instructions (Signed)
Medication Instructions:  No medication changes. *If you need a refill on your cardiac medications before your next appointment, please call your pharmacy*   Lab Work: Your physician recommends that you have your lipids done today in the office.  If you have labs (blood work) drawn today and your tests are completely normal, you will receive your results only by: Marland Kitchen MyChart Message (if you have MyChart) OR . A paper copy in the mail If you have any lab test that is abnormal or we need to change your treatment, we will call you to review the results.   Testing/Procedures: Your physician has requested that you have an echocardiogram. Echocardiography is a painless test that uses sound waves to create images of your heart. It provides your doctor with information about the size and shape of your heart and how well your heart's chambers and valves are working. This procedure takes approximately one hour. There are no restrictions for this procedure.   Follow-Up: At [facility], you and your health needs are our priority.  As part of our continuing mission to provide you with exceptional heart care, we have created designated Provider Care Teams.  These Care Teams include your primary Cardiologist (physician) and Advanced Practice Providers (APPs -  Physician Assistants and Nurse Practitioners) who all work together to provide you with the care you need, when you need it.  We recommend signing up for the patient portal called "MyChart".  Sign up information is provided on this After Visit Summary.  MyChart is used to connect with patients for Virtual Visits (Telemedicine).  Patients are able to view lab/test results, encounter notes, upcoming appointments, etc.  Non-urgent messages can be sent to your provider as well.   To learn more about what you can do with MyChart, go to [email or web address].    Your next appointment:   3 month(s)  The format for your next appointment:   In  Person  Provider:   Thomasene Ripple, DO   Other Instructions  Echocardiogram An echocardiogram is a procedure that uses painless sound waves (ultrasound) to produce an image of the heart. Images from an echocardiogram can provide important information about:  Signs of coronary artery disease (CAD).  Aneurysm detection. An aneurysm is a weak or damaged part of an artery wall that bulges out from the normal force of blood pumping through the body.  Heart size and shape. Changes in the size or shape of the heart can be associated with certain conditions, including heart failure, aneurysm, and CAD.  Heart muscle function.  Heart valve function.  Signs of a past heart attack.  Fluid buildup around the heart.  Thickening of the heart muscle.  A tumor or infectious growth around the heart valves. Tell a health care provider about:  Any allergies you have.  All medicines you are taking, including vitamins, herbs, eye drops, creams, and over-the-counter medicines.  Any blood disorders you have.  Any surgeries you have had.  Any medical conditions you have.  Whether you are pregnant or may be pregnant. What are the risks? Generally, this is a safe procedure. However, problems may occur, including:  Allergic reaction to dye (contrast) that may be used during the procedure. What happens before the procedure? No specific preparation is needed. You may eat and drink normally. What happens during the procedure?   An IV tube may be inserted into one of your veins.  You may receive contrast through this tube. A contrast is an injection that improves  the quality of the pictures from your heart.  A gel will be applied to your chest.  A wand-like tool (transducer) will be moved over your chest. The gel will help to transmit the sound waves from the transducer.  The sound waves will harmlessly bounce off of your heart to allow the heart images to be captured in real-time motion. The  images will be recorded on a computer. The procedure may vary among health care providers and hospitals. What happens after the procedure?  You may return to your normal, everyday life, including diet, activities, and medicines, unless your health care provider tells you not to do that. Summary  An echocardiogram is a procedure that uses painless sound waves (ultrasound) to produce an image of the heart.  Images from an echocardiogram can provide important information about the size and shape of your heart, heart muscle function, heart valve function, and fluid buildup around your heart.  You do not need to do anything to prepare before this procedure. You may eat and drink normally.  After the echocardiogram is completed, you may return to your normal, everyday life, unless your health care provider tells you not to do that. This information is not intended to replace advice given to you by your health care provider. Make sure you discuss any questions you have with your health care provider. Document Revised: 08/20/2018 Document Reviewed: 06/01/2016 Elsevier Patient Education  2020 [employer].

## 2019-11-25 NOTE — Progress Notes (Signed)
Cardiology Office Note:    Date:  11/25/2019   ID:  Herbert Evans, DOB 10-Jul-1945, MRN [id number]  PCP:  Dema Severin, NP  Cardiologist:  Thomasene Ripple, DO  Electrophysiologist:  None   Referring MD: Dema Severin, NP   "I am doing well"  History of Present Illness:    Herbert Evans is a 74 y.o. male with a hx of hyperlipidemia statin intolerant history of ventricular tachycardia on beta-blocker, diabetes family history of coronary artery disease.  Last saw the patient in April 2021 at that time we discussed medication choices given the fact that he was intolerant to statin but could not afford PCSK9 inhibitors.  I started the patient on Pitvastatin and zetia.  At that time I asked the patient to get a repeat lipid profile in 8 weeks.  He is here today for follow-up visit however he did not get his lipid profile.  Today he tells me though he has been experiencing some shortness of breath on exertion. He notes that the shortness of breath is getting progressively worse.  Past Medical History:  Diagnosis Date  . Arthritis    back, hands  . 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 DOE (dyspnea on exertion)   . History of colon cancer    03-01-2005   s/p  sigmoid colectomy w/ negative nodes  (07-27-2019  per pt last colonoscopy 2019 , no recurrence)  . History of Helicobacter pylori infection    09/ 2006  . History of kidney stones   . 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  . Hypertension   . Mixed hyperlipidemia   . Paroxysmal ventricular tachycardia [facility])    cardiologist--- dr Desma Maxim    (07-27-2019  per pt no issues or s&s since 2018)  takes BB  . RBBB (right bundle branch block)   . Right ureteral stone   . Type 2 diabetes mellitus (HCC)    followed by pcp --- (07-27-2019  checks blood sugar three times per week in am,  fasting sugar-- 125-160)  . Wears dentures    upper    Past Surgical  History:  Procedure Laterality Date  . CARDIAC CATHETERIZATION  02-09-2002   dr Sharyn Lull  @[facility]    ef 50-55% w/ 10-15% LAD  . CARDIAC CATHETERIZATION  06-28-2016   dr 06-30-2016 @[facility]    for arrhythmia)--- normal coronaries, mild focal wall motion abnormallity of distal inferapical wall,  severe right innominate / subclavin tortuosity  . CATARACT EXTRACTION W/ INTRAOCULAR LENS  IMPLANT, BILATERAL  2018  . COLON RESECTION SIGMOID  03-01-2005   @[facility]   . CYSTOSCOPY WITH RETROGRADE PYELOGRAM, URETEROSCOPY AND STENT PLACEMENT Right 08/03/2019   Procedure: CYSTOSCOPY WITH RIGHT  RETROGRADE PYELOGRAM, URETEROSCOPY HOLMIUMM LASER AND STENT PLACEMENT;  Surgeon: 03-03-2005, MD;  Location: [facility];  Service: Urology;  Laterality: Right;  . CYSTOSCOPY/RETROGRADE/URETEROSCOPY/STONE EXTRACTION WITH BASKET  1970s  . LUNG REMOVAL, PARTIAL Left 1967   lower lobe for pneumonia per pt  . TONSILLECTOMY AND ADENOIDECTOMY  child    Current Medications: Current Meds  Medication Sig  . aspirin EC 81 MG tablet Take 81 mg by mouth every morning.  . Aspirin-Salicylamide-Caffeine (BC FAST PAIN RELIEF) 650-195-33.3 MG PACK Take 1 Package by mouth daily as needed (mild pain).  08/05/2019 ezetimibe (ZETIA) 10 MG tablet Take 1 tablet (10 mg total) by  mouth daily.  . memantine (NAMENDA) 5 MG tablet Take 5 mg by mouth every other day.  . metFORMIN (GLUCOPHAGE) 1000 MG tablet Take 1,000 mg by mouth 2 (two) times daily with a meal.   . metoprolol succinate (TOPROL-XL) 100 MG 24 hr tablet Take 100 mg by mouth daily. Take with or immediately following a meal.   . promethazine-dextromethorphan (PROMETHAZINE-DM) 6.25-15 MG/5ML syrup Take 5 mLs by mouth every 6 (six) hours as needed.  . traZODone (DESYREL) 100 MG tablet Take 100 mg by mouth at bedtime.     Allergies:   Atorvastatin and Rosuvastatin   Social History   Socioeconomic History  . Marital status: Married    Spouse name: Not on file  . Number of children: Not on  file  . Years of education: Not on file  . Highest education level: Not on file  Occupational History  . Not on file  Tobacco Use  . Smoking status: Never Smoker  . Smokeless tobacco: Current User    Types: Snuff  . Tobacco comment: 07-27-2019  dip tobacco since age 21  Substance and Sexual Activity  . Alcohol use: No  . Drug use: Never  . Sexual activity: Not on file  Other Topics Concern  . Not on file  Social History Narrative  . Not on file   Social Determinants of Health   Financial Resource Strain:   . Difficulty of Paying Living Expenses:   Food Insecurity:   . Worried About Programme researcher, broadcasting/film/video in the Last Year:   . Barista in the Last Year:   Transportation Needs:   . Freight forwarder (Medical):   Marland Kitchen Lack of Transportation (Non-Medical):   Physical Activity:   . Days of Exercise per Week:   . Minutes of Exercise per Session:   Stress:   . Feeling of Stress :   Social Connections:   . Frequency of Communication with Friends and Family:   . Frequency of Social Gatherings with Friends and Family:   . Attends Religious Services:   . Active Member of Clubs or Organizations:   . Attends Banker Meetings:   Marland Kitchen Marital Status:      Family History: The patient's family history includes Asthma in his mother; Heart attack in his mother; Lung cancer in his sister, sister, and sister; Stroke in his father.  ROS:   Review of Systems  Constitution: Negative for decreased appetite, fever and weight gain.  HENT: Negative for congestion, ear discharge, hoarse voice and sore throat.   Eyes: Negative for discharge, redness, vision loss in right eye and visual halos.  Cardiovascular: Negative for chest pain, dyspnea on exertion, leg swelling, orthopnea and palpitations.  Respiratory: Negative for cough, hemoptysis, shortness of breath and snoring.   Endocrine: Negative for heat intolerance and polyphagia.  Hematologic/Lymphatic: Negative for bleeding  problem. Does not bruise/bleed easily.  Skin: Negative for flushing, nail changes, rash and suspicious lesions.  Musculoskeletal: Negative for arthritis, joint pain, muscle cramps, myalgias, neck pain and stiffness.  Gastrointestinal: Negative for abdominal pain, bowel incontinence, diarrhea and excessive appetite.  Genitourinary: Negative for decreased libido, genital sores and incomplete emptying.  Neurological: Negative for brief paralysis, focal weakness, headaches and loss of balance.  Psychiatric/Behavioral: Negative for altered mental status, depression and suicidal ideas.  Allergic/Immunologic: Negative for HIV exposure and persistent infections.    EKGs/Labs/Other Studies Reviewed:    The following studies were reviewed today:   EKG: None today  Left heart catheterization in 2018 Normal coronary arteries. Mild focal wall motion abnormality of distal inferapical wall Note: severe R innominate/subclavian tortuosity; angiography difficult; consider L radial or femoral access in future I have reviewed the recent history and physical documentation. I personally spent 30 minutes continuously monitoring the patient during the administration of moderate sedation. Pre and post activities have been reviewed. I was present for the entire procedure.  Signatures  Electronically signed by Eliot Ford, MD, FACC(Diagnostic  Physician) on 07/14/2016 16:51  Angiographic findings cardiac Arteries and Lesion Findings LMCA: 0%. LAD: 0%. LCx: 0%. RCA: 0%. Ramus: 0%.  Recent Labs: 05/12/2019: Platelets 166 08/03/2019: BUN 19; Creatinine, Ser 0.80; Hemoglobin 15.3; Potassium 4.5; Sodium 137 08/30/2019: ALT 11  Recent Lipid Panel No results found for: CHOL, TRIG, HDL, CHOLHDL, VLDL, LDLCALC, LDLDIRECT  Physical Exam:    VS:  BP 110/68   Pulse 70   Ht  (1.778 m)   Wt 193 lb 6.4 oz (87.7 kg)   SpO2 97%   BMI 27.75 kg/m     Wt Readings from Last 3 Encounters:  11/25/19  193 lb 6.4 oz (87.7 kg)  08/30/19 191 lb (86.6 kg)  08/03/19 192 lb (87.1 kg)     GEN: Well nourished, well developed in no acute distress HEENT: Normal NECK: No JVD; No carotid bruits LYMPHATICS: No lymphadenopathy CARDIAC: S1S2 noted,RRR, no murmurs, rubs, gallops RESPIRATORY:  Clear to auscultation without rales, wheezing or rhonchi  ABDOMEN: Soft, non-tender, non-distended, +bowel sounds, no guarding. EXTREMITIES: No edema, No cyanosis, no clubbing MUSCULOSKELETAL:  No deformity  SKIN: Warm and dry NEUROLOGIC:  Alert and oriented x 3, non-focal PSYCHIATRIC:  Normal affect, good insight  ASSESSMENT:    1. Shortness of breath   2. Mixed hyperlipidemia   3. Type 2 diabetes mellitus with complication, without long-term current use of insulin (HCC)    PLAN:     1.  His shortness of breath is concerning.  We will get an echocardiogram for completeness assess his LV function as well as his RV function and any other valvular abnormalities.  I reviewed his cardiac catheterization from twenty eighteen which is normal.  2.  I am not sure if the patient is actually taking his medication with Pitavastatin and his zetia. Today we will get his lipid profile.   3.  Type 2 diabetes-continue patient on his current medication per PCP.  The patient is in agreement with the above plan. The patient left the office in stable condition.  The patient will follow up in 3 months or sooner if needed   Medication Adjustments/Labs and Tests Ordered: Current medicines are reviewed at length with the patient today.  Concerns regarding medicines are outlined above.  Orders Placed This Encounter  Procedures  . Lipid panel  . ECHOCARDIOGRAM COMPLETE   No orders of the defined types were placed in this encounter.   Patient Instructions  Medication Instructions:  No medication changes. *If you need a refill on your cardiac medications before your next appointment, please call your pharmacy*   Lab  Work: Your physician recommends that you have your lipids done today in the office.  If you have labs (blood work) drawn today and your tests are completely normal, you will receive your results only by: Marland Kitchen MyChart Message (if you have MyChart) OR . A paper copy in the mail If you have any lab test that is abnormal or we need to change your treatment, we will call you to review  the results.   Testing/Procedures: Your physician has requested that you have an echocardiogram. Echocardiography is a painless test that uses sound waves to create images of your heart. It provides your doctor with information about the size and shape of your heart and how well your heart's chambers and valves are working. This procedure takes approximately one hour. There are no restrictions for this procedure.   Follow-Up: At [facility], you and your health needs are our priority.  As part of our continuing mission to provide you with exceptional heart care, we have created designated Provider Care Teams.  These Care Teams include your primary Cardiologist (physician) and Advanced Practice Providers (APPs -  Physician Assistants and Nurse Practitioners) who all work together to provide you with the care you need, when you need it.  We recommend signing up for the patient portal called "MyChart".  Sign up information is provided on this After Visit Summary.  MyChart is used to connect with patients for Virtual Visits (Telemedicine).  Patients are able to view lab/test results, encounter notes, upcoming appointments, etc.  Non-urgent messages can be sent to your provider as well.   To learn more about what you can do with MyChart, go to [email or web address].    Your next appointment:   3 month(s)  The format for your next appointment:   In Person  Provider:   Thomasene Ripple, DO   Other Instructions  Echocardiogram An echocardiogram is a procedure that uses painless sound waves (ultrasound) to produce an  image of the heart. Images from an echocardiogram can provide important information about:  Signs of coronary artery disease (CAD).  Aneurysm detection. An aneurysm is a weak or damaged part of an artery wall that bulges out from the normal force of blood pumping through the body.  Heart size and shape. Changes in the size or shape of the heart can be associated with certain conditions, including heart failure, aneurysm, and CAD.  Heart muscle function.  Heart valve function.  Signs of a past heart attack.  Fluid buildup around the heart.  Thickening of the heart muscle.  A tumor or infectious growth around the heart valves. Tell a health care provider about:  Any allergies you have.  All medicines you are taking, including vitamins, herbs, eye drops, creams, and over-the-counter medicines.  Any blood disorders you have.  Any surgeries you have had.  Any medical conditions you have.  Whether you are pregnant or may be pregnant. What are the risks? Generally, this is a safe procedure. However, problems may occur, including:  Allergic reaction to dye (contrast) that may be used during the procedure. What happens before the procedure? No specific preparation is needed. You may eat and drink normally. What happens during the procedure?   An IV tube may be inserted into one of your veins.  You may receive contrast through this tube. A contrast is an injection that improves the quality of the pictures from your heart.  A gel will be applied to your chest.  A wand-like tool (transducer) will be moved over your chest. The gel will help to transmit the sound waves from the transducer.  The sound waves will harmlessly bounce off of your heart to allow the heart images to be captured in real-time motion. The images will be recorded on a computer. The procedure may vary among health care providers and hospitals. What happens after the procedure?  You may return to your  normal, everyday life, including diet, activities, and  medicines, unless your health care provider tells you not to do that. Summary  An echocardiogram is a procedure that uses painless sound waves (ultrasound) to produce an image of the heart.  Images from an echocardiogram can provide important information about the size and shape of your heart, heart muscle function, heart valve function, and fluid buildup around your heart.  You do not need to do anything to prepare before this procedure. You may eat and drink normally.  After the echocardiogram is completed, you may return to your normal, everyday life, unless your health care provider tells you not to do that. This information is not intended to replace advice given to you by your health care provider. Make sure you discuss any questions you have with your health care provider. Document Revised: 08/20/2018 Document Reviewed: 06/01/2016 Elsevier Patient Education  2020 [employer]     Adopting a Healthy Lifestyle.  Know what a healthy weight is for you (roughly BMI <25) and aim to maintain this   Aim for 7+ servings of fruits and vegetables daily   65-80+ fluid ounces of water or unsweet tea for healthy kidneys   Limit to max 1 drink of alcohol per day; avoid smoking/tobacco   Limit animal fats in diet for cholesterol and heart health - choose grass fed whenever available   Avoid highly processed foods, and foods high in saturated/trans fats   Aim for low stress - take time to unwind and care for your mental health   Aim for 150 min of moderate intensity exercise weekly for heart health, and weights twice weekly for bone health   Aim for 7-9 hours of sleep daily   When it comes to diets, agreement about the perfect plan isnt easy to find, even among the experts. Experts at the [employer] developed an idea known as the Healthy Eating Plate. Just imagine a plate divided into logical, healthy  portions.   The emphasis is on diet quality:   Load up on vegetables and fruits - one-half of your plate: Aim for color and variety, and remember that potatoes dont count.   Go for whole grains - one-quarter of your plate: Whole wheat, barley, wheat berries, quinoa, oats, brown rice, and foods made with them. If you want pasta, go with whole wheat pasta.   Protein power - one-quarter of your plate: Fish, chicken, beans, and nuts are all healthy, versatile protein sources. Limit red meat.   The diet, however, does go beyond the plate, offering a few other suggestions.   Use healthy plant oils, such as olive, canola, soy, corn, sunflower and peanut. Check the labels, and avoid partially hydrogenated oil, which have unhealthy trans fats.   If youre thirsty, drink water. Coffee and tea are good in moderation, but skip sugary drinks and limit milk and dairy products to one or two daily servings.   The type of carbohydrate in the diet is more important than the amount. Some sources of carbohydrates, such as vegetables, fruits, whole grains, and beans-are healthier than others.   Finally, stay active  Signed, Thomasene RippleKardie Braxon Suder, DO  11/25/2019 1:38 PM    [facility]

## 2019-11-26 ENCOUNTER — Telehealth: Payer: Self-pay | Admitting: Cardiology

## 2019-11-26 ENCOUNTER — Telehealth: Payer: Self-pay

## 2019-11-26 DIAGNOSIS — E782 Mixed hyperlipidemia: Secondary | ICD-10-CM

## 2019-11-26 LAB — LIPID PANEL
Chol/HDL Ratio: 4.9 ratio (ref 0.0–5.0)
Cholesterol, Total: 234 mg/dL — ABNORMAL HIGH (ref 100–199)
HDL: 48 mg/dL (ref >39)
LDL Chol Calc (NIH): 164 mg/dL — ABNORMAL HIGH (ref 0–99)
Triglycerides: 122 mg/dL (ref 0–149)
VLDL Cholesterol Cal: 22 mg/dL (ref 5–40)

## 2019-11-26 NOTE — Addendum Note (Signed)
Addended by: Eleonore Chiquito on: 11/26/2019 11:20 AM   Modules accepted: Orders

## 2019-11-26 NOTE — Telephone Encounter (Signed)
Results reviewed with pt as per Dr. Tobb's note.  Pt verbalized understanding and had no additional questions. 

## 2019-11-26 NOTE — Telephone Encounter (Signed)
-----   Message from Thomasene Ripple, DO sent at 11/26/2019 10:12 AM EDT ----- Elevated LDL - statin intolerant, please refer to our lipid clinic

## 2019-11-26 NOTE — Telephone Encounter (Signed)
Left message on patients voicemail to please return our call.   

## 2019-11-26 NOTE — Telephone Encounter (Signed)
Follow up  ° ° °Pt returning call  °

## 2019-11-26 NOTE — Telephone Encounter (Signed)
      I went in pt's chart to see who was talking to and was disconnected. I transferred the call to [place] and she talked to him

## 2019-12-16 ENCOUNTER — Other Ambulatory Visit: Payer: Self-pay

## 2019-12-16 ENCOUNTER — Ambulatory Visit (INDEPENDENT_AMBULATORY_CARE_PROVIDER_SITE_OTHER): Payer: Medicare Other

## 2019-12-16 DIAGNOSIS — R0602 Shortness of breath: Secondary | ICD-10-CM

## 2019-12-16 LAB — ECHOCARDIOGRAM COMPLETE
Area-P 1/2: 2.73 cm2
S' Lateral: 3.45 cm

## 2019-12-16 NOTE — Progress Notes (Signed)
Complete echocardiogram performed.  Jimmy Ahmarion Saraceno RDCS, RVT  

## 2019-12-17 ENCOUNTER — Telehealth: Payer: Self-pay

## 2019-12-17 NOTE — Telephone Encounter (Signed)
Spoke with patient regarding results and recommendation.  Patient verbalizes understanding and is agreeable to plan of care. Advised patient to call back with any issues or concerns.  

## 2019-12-17 NOTE — Telephone Encounter (Signed)
-----   Message from Thomasene Ripple, DO sent at 12/17/2019  8:15 AM EDT ----- The echo showed that the heart is not fully relaxing like it should ( diastolic dysfunction) ,but otherwise normal. I will discuss it at the next office visit.

## 2020-01-10 ENCOUNTER — Other Ambulatory Visit: Payer: Self-pay

## 2020-01-10 ENCOUNTER — Encounter: Payer: Self-pay | Admitting: Pharmacist

## 2020-01-10 ENCOUNTER — Ambulatory Visit (INDEPENDENT_AMBULATORY_CARE_PROVIDER_SITE_OTHER): Payer: Medicare Other | Admitting: Pharmacist

## 2020-01-10 DIAGNOSIS — E785 Hyperlipidemia, unspecified: Secondary | ICD-10-CM | POA: Diagnosis not present

## 2020-01-10 MED ORDER — EZETIMIBE 10 MG PO TABS: 10.0000 mg | ORAL_TABLET | Freq: Every day | ORAL | 3 refills | Status: DC

## 2020-01-10 MED ORDER — LIVALO 2 MG PO TABS: 2.0000 mg | ORAL_TABLET | Freq: Every day | ORAL | 3 refills | Status: DC

## 2020-01-10 MED ORDER — LIVALO 2 MG PO TABS: 2.0000 mg | ORAL_TABLET | Freq: Every day | ORAL | 0 refills | Status: DC

## 2020-01-10 NOTE — Progress Notes (Signed)
Patient ID: [name]                 DOB: Dec 23, 1945                    MRN: [id number]     HPI:  Herbert Evans is a 74 y.o. male patient referred to lipid clinic by Dr Herbert Evans. PMH is significant for tachycardia, DM, hypertension, family hx of CAD, and early dementia. He presents to Lipid clinica accompany by his wife and denies problems with any current medication. They can not recall names of current or past medication therapy. He never picked up Rx for Livalo and after , verification with prefer pharmacy, he never refill Rx for ezetimibe and stopped taking in June.  Current Medications: none  Intolerances:  Atorvastatin - leg pain Rosuvastatin - leg pain and trouble sleeping Lovastatin 40mg  - leg pain   LDL goal: < 100mg /dL  Exercise: activities of daily living  Family History: patient's family history includes Asthma in his mother; Heart attack in his mother 19s; Lung cancer in his sister, sister, and sister; Stroke in his father 78s., 1 son with high cholesterol.  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     Aspirin-Salicylamide-Caffeine (BC FAST PAIN RELIEF) 650-195-33.3 MG PACK Take 1 Package by mouth daily as needed (mild pain).     memantine (NAMENDA) 5 MG tablet Take 5 mg by mouth every other day.     metFORMIN (GLUCOPHAGE) 1000 MG tablet Take 1,000 mg by mouth 2 (two) times daily with a meal.      metoprolol succinate (TOPROL-XL) 100 MG 24 hr tablet Take 100 mg by mouth daily. Take with or immediately following a meal.      promethazine-dextromethorphan (PROMETHAZINE-DM) 6.25-15 MG/5ML syrup Take 5 mLs by mouth every 6 (six) hours as needed.     traZODone (DESYREL) 100 MG tablet Take 100 mg by mouth at bedtime.     No current facility-administered medications on file prior to visit.    Allergies  Allergen Reactions  Atorvastatin Other (See Comments)    myalgias   Rosuvastatin Other (See Comments)    myalgias    Hyperlipidemia LDL goal <100 LDL above goal for primary prevention, and patient unable to tolerate high intensity statins. Failed trials include atorvastatin, rosuvastatin, and lovastatin. All previous statins caused severe muscle pain and trouble sleeping. Patient recall using some samples provided by Dr Herbert Evans in April , but was unable to get prescription d/t high cost. Also stopped refilling ezetimibe in June for unknown reason.   Will resume Livalo 2mg  daily (4 weeks sample provided), resume ezetimbe 10mg  daily. Prior-authorization will be submitted today and plan to follow up to help patinet with financial assistance.  Plan to change Livalo to nexletol if patient is unable to tolerate Livalo. Otherwise, will repeat fasting blood work in 8 weeks after combination therapy started.     Herbert Evans PharmD, BCPS, CPP [facility] [address] [place] [address] 01/10/2020 4:24 PM

## 2020-01-10 NOTE — Patient Instructions (Addendum)
Your Results:             Your most recent labs Goal  Total Cholesterol 234 < 200  Triglycerides 122 < 150  HDL (good cholesterol) 48 > 40  LDL (bad cholesterol 164 < 100   Medication changes: *RESUME taking Livalo 70md daily with supper *Take ezetimibe 10mg  daily   *Call [phone] if unable to afford medication or problems taking new medication*  Lab orders: *Plan to repeat fasting blood work 8 weeks after starting new medication  Patient Assistance:  The [employer] foundation offers assistance to help pay for medication copays.  They will cover copays for all cholesterol lowering meds, including statins, fibrates, omega-3 oils, ezetimibe, Repatha, Praluent, Nexletol, Nexlizet.  The cards are usually good for $2,500 or 12 months, whichever comes first. 1. Go to healthwellfoundation.org 2. Click on "Apply Now" 3. Answer questions as to whom is applying (patient or representative) 4. Your disease fund will be "hypercholesterolemia - Medicare access" 5. They will ask questions about finances and which medications you are taking for cholesterol 6. When you submit, the approval is usually within minutes.  You will need to print the card information from the site 7. You will need to show this information to your pharmacy, they will bill your Medicare Part D plan first -then bill [employer] --for the copay.   You can also call them at [phone], although the hold times can be quite long.   Thank you for choosing [facility]

## 2020-01-10 NOTE — Assessment & Plan Note (Signed)
LDL above goal for primary prevention, and patient unable to tolerate high intensity statins. Failed trials include atorvastatin, rosuvastatin, and lovastatin. All previous statins caused severe muscle pain and trouble sleeping. Patient recall using some samples provided by Dr Servando Salina in April , but was unable to get prescription d/t high cost. Also stopped refilling ezetimibe in June for unknown reason.   Will resume Livalo 2mg  daily (4 weeks sample provided), resume ezetimbe 10mg  daily. Prior-authorization will be submitted today and plan to follow up to help patinet with financial assistance.  Plan to change Livalo to nexletol if patient is unable to tolerate Livalo. Otherwise, will repeat fasting blood work in 8 weeks after combination therapy started.

## 2020-01-11 ENCOUNTER — Other Ambulatory Visit: Payer: Self-pay

## 2020-01-11 ENCOUNTER — Telehealth: Payer: Self-pay

## 2020-01-11 NOTE — Telephone Encounter (Signed)
Pt called in and stated that they cannot afford their livalo. I offered to enroll them into the [employer] foundation however they stated that they needed some time to gather the information needed to process the enrollment

## 2020-01-26 ENCOUNTER — Ambulatory Visit: Payer: Medicare Other | Admitting: Pulmonary Disease

## 2020-01-26 ENCOUNTER — Encounter: Payer: Self-pay | Admitting: Pulmonary Disease

## 2020-01-26 ENCOUNTER — Other Ambulatory Visit: Payer: Self-pay

## 2020-01-26 VITALS — BP 112/60 | HR 72 | Temp 97.9°F | Ht 69.0 in | Wt 190.6 lb

## 2020-01-26 DIAGNOSIS — R0609 Other forms of dyspnea: Secondary | ICD-10-CM

## 2020-01-26 DIAGNOSIS — R0600 Dyspnea, unspecified: Secondary | ICD-10-CM

## 2020-01-26 MED ORDER — BUDESONIDE-FORMOTEROL FUMARATE 80-4.5 MCG/ACT IN AERO: 2.0000 | INHALATION_SPRAY | Freq: Two times a day (BID) | RESPIRATORY_TRACT | 12 refills | Status: DC

## 2020-01-26 NOTE — Patient Instructions (Signed)
Nice to meet you!  Use symbicort 2 puffs twice a day with a spacer. Rinse your mouth out with water after every use.  Come back in 2 months with PFTs and follow up visit with Dr. Judeth Horn.

## 2020-01-26 NOTE — Progress Notes (Signed)
@Patient  ID: , male    DOB: 16-Nov-1945, 74 y.o.   MRN: [id number]  Chief Complaint  Patient presents with  . Consult    short of breath most all the time, part of lung removed on left side, and upper left lung just stopped working, dizzy    Referring provider: [id number], NP  HPI:   Herbert Evans is a 74 year old man whom are seen at the request of 61 NP for evaluation of dyspnea exertion.  Patient and wife provide history.  Both think dyspnea exertion has been present for the last 2 to 3 years.  Overall, no worsening but certainly not getting better.  He can walk on flat surfaces for example from waiting room to hear and become short of breath.  Feels like he needs to rest and catch his breath.  Symptoms occur more quickly when going up inclines or stairs.  He has no real cough.  He denies any orthopnea or PND.  No wheezing.  He does state he gets allergy symptoms of runny nose and watery eyes.  The symptoms usually last between the start of spring and in the fall.  Symptoms today are not too bad.  Shortness of breath and nasal symptoms are worse after working in the yard, mowing or weed eating.  No history of asthma.  Never smoker.  Never had symptoms like this before.  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  MMRC: No flowsheet data found.  Epworth:  No flowsheet data found.  Tests:   FENO:  No results found for: NITRICOXIDE  PFT: No flowsheet data found.  WALK:  No flowsheet data found.  Imaging: No results found.  Lab Results:  CBC    Component Value Date/Time   WBC 9.0 05/12/2019 1504   RBC 5.26 05/12/2019 1504    HGB 15.3 08/03/2019 0634   HCT 45.0 08/03/2019 0634   PLT 166 05/12/2019 1504   MCV 91.8 05/12/2019 1504   MCH 31.6 05/12/2019 1504   MCHC 34.4 05/12/2019 1504   RDW 11.9 05/12/2019 1504    BMET    Component Value Date/Time   NA 137 08/03/2019 0634   K 4.5 08/03/2019 0634   CL 99 08/03/2019 0634   CO2 27 05/12/2019 1410   GLUCOSE 191 (H) 08/03/2019 0634   BUN 19 08/03/2019 0634   CREATININE 0.80 08/03/2019 0634   CALCIUM 9.6 05/12/2019 1410   GFRNONAA >60 05/12/2019 1410   GFRAA >60 05/12/2019 1410    BNP No results found for: BNP  ProBNP No results found for: PROBNP  Specialty Problems    None      Allergies  Allergen Reactions  . Atorvastatin Other (See Comments)    myalgias  . Rosuvastatin Other (See Comments)    myalgias    Immunization History  Administered Date(s) Administered  . PFIZER SARS-COV-2 Vaccination 07/11/2019, 08/10/2019  . Tdap 12/31/2014    Past Medical History:  Diagnosis Date  . Arthritis    back, hands  . 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 DOE (dyspnea on exertion)   .  History of colon cancer    03-01-2005   s/p  sigmoid colectomy w/ negative nodes  (07-27-2019  per pt last colonoscopy 2019 , no recurrence)  . History of Helicobacter pylori infection    09/ 2006  . History of kidney stones   . 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  . Hypertension   . Mixed hyperlipidemia   . Paroxysmal ventricular tachycardia [facility])    cardiologist--- dr Desma Maxim    (07-27-2019  per pt no issues or s&s since 2018)  takes BB  . Pneumonia   . RBBB (right bundle branch block)   . Right ureteral stone   . Type 2 diabetes mellitus (HCC)    followed by pcp --- (07-27-2019  checks blood sugar three times per week in am,  fasting sugar-- 125-160)  . Wears dentures    upper    Tobacco History: Social History   Tobacco Use  Smoking Status Former Smoker  .  Types: Cigars  Smokeless Tobacco Current User  . Types: Snuff, Chew  Tobacco Comment   01/26/20 dips still last cigar was in 1976   Ready to quit: Not Answered Counseling given: Not Answered Comment: 01/26/20 dips still last cigar was in 1976   Continue to not smoke  Outpatient Encounter Medications as of 01/26/2020  Medication Sig  . aspirin EC 81 MG tablet Take 81 mg by mouth every morning.  . Aspirin-Salicylamide-Caffeine (BC FAST PAIN RELIEF) 650-195-33.3 MG PACK Take 1 Package by mouth daily as needed (mild pain).  Marland Kitchen ezetimibe (ZETIA) 10 MG tablet Take 1 tablet (10 mg total) by mouth daily.  . memantine (NAMENDA) 5 MG tablet Take 5 mg by mouth every other day.  . metFORMIN (GLUCOPHAGE) 1000 MG tablet Take 1,000 mg by mouth 2 (two) times daily with a meal.   . metoprolol succinate (TOPROL-XL) 100 MG 24 hr tablet Take 100 mg by mouth daily. Take with or immediately following a meal.   . Pitavastatin Calcium (LIVALO) 2 MG TABS Take 1 tablet (2 mg total) by mouth daily.  . promethazine-dextromethorphan (PROMETHAZINE-DM) 6.25-15 MG/5ML syrup Take 5 mLs by mouth every 6 (six) hours as needed.  . traZODone (DESYREL) 100 MG tablet Take 100 mg by mouth at bedtime.  . budesonide-formoterol (SYMBICORT) 80-4.5 MCG/ACT inhaler Inhale 2 puffs into the lungs in the morning and at bedtime. With spacer.   No facility-administered encounter medications on file as of 01/26/2020.     Review of Systems  Review of Systems  On further questioning, wife says he has intermittent left-sided chest pain on exertion that is relieved with rest.  No cough.  Comprehensive review of systems otherwise negative. Physical Exam  BP 112/60 (BP Location: Left Arm, Cuff Size: Normal)   Pulse 72   Temp 97.9 F (36.6 C) (Temporal)   Ht 5\' 9"  (1.753 m)   Wt 190 lb 9.6 oz (86.5 kg)   SpO2 96%   BMI 28.15 kg/m   Wt Readings from Last 5 Encounters:  01/26/20 190 lb 9.6 oz (86.5 kg)  01/10/20 194 lb 12.8 oz (88.4  kg)  11/25/19 193 lb 6.4 oz (87.7 kg)  08/30/19 191 lb (86.6 kg)  08/03/19 192 lb (87.1 kg)    BMI Readings from Last 5 Encounters:  01/26/20 28.15 kg/m  01/10/20 27.17 kg/m  11/25/19 27.75 kg/m  08/30/19 26.64 kg/m  08/03/19 26.78 kg/m     Physical Exam General: Sitting in chair, no acute distress Eyes: EOMI,  no icterus Neck: No JVP appreciated sitting upright, supple Respiratory: Distant sounds, clear aspiration bilaterally, no wheeze Cardiovascular: Regular rhythm, no murmurs Abdomen: Nondistended, bowel sounds present MSK: No joint effusions, no synovitis Neuro: No weakness, normal gait, Psych: Normal mood, full affect   Assessment & Plan:   Dyspnea on exertion: Suspect multifactorial.  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  Lastly, given seasonal allergies, query development of asthma.  Will trial ICS/LABA with mid dose Symbicort.  PFTs at next visit.  Lastly he does have some exertional chest pain with the symptoms, wonder if anginal equivalent.   Return in about 2 months (around 03/27/2020).   Karren Burly, MD 01/26/2020

## 2020-02-29 ENCOUNTER — Ambulatory Visit: Payer: Medicare Other | Admitting: Cardiology

## 2020-10-13 ENCOUNTER — Inpatient Hospital Stay (HOSPITAL_COMMUNITY): Payer: Medicare Other

## 2020-10-13 ENCOUNTER — Inpatient Hospital Stay (HOSPITAL_COMMUNITY)
Admission: EM | Admit: 2020-10-13 | Discharge: 2020-10-16 | DRG: 193 | Disposition: A | Discharge status: Home / Self Care | Payer: Medicare Other | Attending: Internal Medicine | Admitting: Internal Medicine

## 2020-10-13 ENCOUNTER — Emergency Department (HOSPITAL_COMMUNITY): Payer: Medicare Other

## 2020-10-13 DIAGNOSIS — Z85038 Personal history of other malignant neoplasm of large intestine: Secondary | ICD-10-CM

## 2020-10-13 DIAGNOSIS — I272 Pulmonary hypertension, unspecified: Secondary | ICD-10-CM | POA: Diagnosis present

## 2020-10-13 DIAGNOSIS — Z9049 Acquired absence of other specified parts of digestive tract: Secondary | ICD-10-CM

## 2020-10-13 DIAGNOSIS — F17220 Nicotine dependence, chewing tobacco, uncomplicated: Secondary | ICD-10-CM | POA: Diagnosis present

## 2020-10-13 DIAGNOSIS — E119 Type 2 diabetes mellitus without complications: Secondary | ICD-10-CM | POA: Diagnosis present

## 2020-10-13 DIAGNOSIS — Z20822 Contact with and (suspected) exposure to covid-19: Secondary | ICD-10-CM | POA: Diagnosis present

## 2020-10-13 DIAGNOSIS — Z7951 Long term (current) use of inhaled steroids: Secondary | ICD-10-CM | POA: Diagnosis not present

## 2020-10-13 DIAGNOSIS — R0602 Shortness of breath: Secondary | ICD-10-CM

## 2020-10-13 DIAGNOSIS — I471 Supraventricular tachycardia: Secondary | ICD-10-CM | POA: Diagnosis present

## 2020-10-13 DIAGNOSIS — E871 Hypo-osmolality and hyponatremia: Secondary | ICD-10-CM | POA: Diagnosis present

## 2020-10-13 DIAGNOSIS — Z79899 Other long term (current) drug therapy: Secondary | ICD-10-CM

## 2020-10-13 DIAGNOSIS — Z902 Acquired absence of lung [part of]: Secondary | ICD-10-CM | POA: Diagnosis not present

## 2020-10-13 DIAGNOSIS — Z7982 Long term (current) use of aspirin: Secondary | ICD-10-CM | POA: Diagnosis not present

## 2020-10-13 DIAGNOSIS — I5033 Acute on chronic diastolic (congestive) heart failure: Secondary | ICD-10-CM | POA: Diagnosis present

## 2020-10-13 DIAGNOSIS — Z801 Family history of malignant neoplasm of trachea, bronchus and lung: Secondary | ICD-10-CM

## 2020-10-13 DIAGNOSIS — I2510 Atherosclerotic heart disease of native coronary artery without angina pectoris: Secondary | ICD-10-CM | POA: Diagnosis present

## 2020-10-13 DIAGNOSIS — Z720 Tobacco use: Secondary | ICD-10-CM | POA: Diagnosis not present

## 2020-10-13 DIAGNOSIS — Z7984 Long term (current) use of oral hypoglycemic drugs: Secondary | ICD-10-CM | POA: Diagnosis not present

## 2020-10-13 DIAGNOSIS — E782 Mixed hyperlipidemia: Secondary | ICD-10-CM | POA: Diagnosis present

## 2020-10-13 DIAGNOSIS — E861 Hypovolemia: Secondary | ICD-10-CM | POA: Diagnosis present

## 2020-10-13 DIAGNOSIS — J189 Pneumonia, unspecified organism: Secondary | ICD-10-CM

## 2020-10-13 DIAGNOSIS — J9601 Acute respiratory failure with hypoxia: Secondary | ICD-10-CM | POA: Diagnosis present

## 2020-10-13 DIAGNOSIS — F0390 Unspecified dementia without behavioral disturbance: Secondary | ICD-10-CM | POA: Diagnosis present

## 2020-10-13 DIAGNOSIS — I110 Hypertensive heart disease with heart failure: Secondary | ICD-10-CM | POA: Diagnosis present

## 2020-10-13 DIAGNOSIS — I5031 Acute diastolic (congestive) heart failure: Secondary | ICD-10-CM | POA: Diagnosis not present

## 2020-10-13 HISTORY — DX: Pneumonia, unspecified organism: J18.9

## 2020-10-13 LAB — RESP PANEL BY RT-PCR (FLU A&B, COVID) ARPGX2
Influenza A by PCR: NEGATIVE
Influenza B by PCR: NEGATIVE
SARS Coronavirus 2 by RT PCR: NEGATIVE

## 2020-10-13 LAB — CBC WITH DIFFERENTIAL/PLATELET
Abs Immature Granulocytes: 0.08 10*3/uL — ABNORMAL HIGH (ref 0.00–0.07)
Basophils Absolute: 0.1 10*3/uL (ref 0.0–0.1)
Basophils Relative: 0 %
Eosinophils Absolute: 0 10*3/uL (ref 0.0–0.5)
Eosinophils Relative: 0 %
HCT: 46.7 % (ref 39.0–52.0)
Hemoglobin: 16 g/dL (ref 13.0–17.0)
Immature Granulocytes: 1 %
Lymphocytes Relative: 11 %
Lymphs Abs: 1.4 10*3/uL (ref 0.7–4.0)
MCH: 31.5 pg (ref 26.0–34.0)
MCHC: 34.3 g/dL (ref 30.0–36.0)
MCV: 91.9 fL (ref 80.0–100.0)
Monocytes Absolute: 1 10*3/uL (ref 0.1–1.0)
Monocytes Relative: 8 %
Neutro Abs: 10.5 10*3/uL — ABNORMAL HIGH (ref 1.7–7.7)
Neutrophils Relative %: 80 %
Platelets: 237 10*3/uL (ref 150–400)
RBC: 5.08 MIL/uL (ref 4.22–5.81)
RDW: 12.2 % (ref 11.5–15.5)
WBC: 13.1 10*3/uL — ABNORMAL HIGH (ref 4.0–10.5)
nRBC: 0 % (ref 0.0–0.2)

## 2020-10-13 LAB — COMPREHENSIVE METABOLIC PANEL
ALT: 13 U/L (ref 0–44)
AST: 14 U/L — ABNORMAL LOW (ref 15–41)
Albumin: 3.5 g/dL (ref 3.5–5.0)
Alkaline Phosphatase: 61 U/L (ref 38–126)
Anion gap: 14 (ref 5–15)
BUN: 13 mg/dL (ref 8–23)
CO2: 21 mmol/L — ABNORMAL LOW (ref 22–32)
Calcium: 9.2 mg/dL (ref 8.9–10.3)
Chloride: 96 mmol/L — ABNORMAL LOW (ref 98–111)
Creatinine, Ser: 1.12 mg/dL (ref 0.61–1.24)
GFR, Estimated: >60 mL/min (ref >60)
Glucose, Bld: 201 mg/dL — ABNORMAL HIGH (ref 70–99)
Potassium: 4.1 mmol/L (ref 3.5–5.1)
Sodium: 131 mmol/L — ABNORMAL LOW (ref 135–145)
Total Bilirubin: 2.1 mg/dL — ABNORMAL HIGH (ref 0.3–1.2)
Total Protein: 7.6 g/dL (ref 6.5–8.1)

## 2020-10-13 LAB — PHOSPHORUS: Phosphorus: 2.6 mg/dL (ref 2.5–4.6)

## 2020-10-13 LAB — BRAIN NATRIURETIC PEPTIDE: B Natriuretic Peptide: 174.3 pg/mL — ABNORMAL HIGH (ref 0.0–100.0)

## 2020-10-13 LAB — GLUCOSE, CAPILLARY
Glucose-Capillary: 186 mg/dL — ABNORMAL HIGH (ref 70–99)
Glucose-Capillary: 202 mg/dL — ABNORMAL HIGH (ref 70–99)

## 2020-10-13 LAB — HIV ANTIBODY (ROUTINE TESTING W REFLEX): HIV Screen 4th Generation wRfx: NONREACTIVE

## 2020-10-13 LAB — LACTIC ACID, PLASMA
Lactic Acid, Venous: 2.1 mmol/L (ref 0.5–1.9)
Lactic Acid, Venous: 2.2 mmol/L (ref 0.5–1.9)

## 2020-10-13 LAB — TROPONIN I (HIGH SENSITIVITY)
Troponin I (High Sensitivity): 16 ng/L (ref <18)
Troponin I (High Sensitivity): 23 ng/L — ABNORMAL HIGH (ref <18)

## 2020-10-13 LAB — TSH: TSH: 3.08 u[IU]/mL (ref 0.350–4.500)

## 2020-10-13 LAB — MAGNESIUM: Magnesium: 1.8 mg/dL (ref 1.7–2.4)

## 2020-10-13 MED ORDER — EZETIMIBE 10 MG PO TABS
10.0000 mg | ORAL_TABLET | Freq: Every day | ORAL | Status: DC
  Administered 2020-10-14 – 2020-10-16 (×3): 10 mg via ORAL
  Filled 2020-10-13 (×4): qty 1

## 2020-10-13 MED ORDER — MEMANTINE HCL 5 MG PO TABS
5.0000 mg | ORAL_TABLET | Freq: Two times a day (BID) | ORAL | Status: DC
  Administered 2020-10-13 – 2020-10-16 (×6): 5 mg via ORAL
  Filled 2020-10-13 (×6): qty 1

## 2020-10-13 MED ORDER — FLUTICASONE FUROATE-VILANTEROL 100-25 MCG/INH IN AEPB
1.0000 | INHALATION_SPRAY | Freq: Every day | RESPIRATORY_TRACT | Status: DC
  Administered 2020-10-14 – 2020-10-16 (×3): 1 via RESPIRATORY_TRACT
  Filled 2020-10-13: qty 28

## 2020-10-13 MED ORDER — ASPIRIN EC 81 MG PO TBEC
81.0000 mg | DELAYED_RELEASE_TABLET | Freq: Every morning | ORAL | Status: DC
  Administered 2020-10-14 – 2020-10-16 (×3): 81 mg via ORAL
  Filled 2020-10-13 (×4): qty 1

## 2020-10-13 MED ORDER — SODIUM CHLORIDE 0.9 % IV SOLN: INTRAVENOUS | Status: DC

## 2020-10-13 MED ORDER — ENOXAPARIN SODIUM 40 MG/0.4ML IJ SOSY
40.0000 mg | PREFILLED_SYRINGE | INTRAMUSCULAR | Status: DC
  Administered 2020-10-13 – 2020-10-15 (×3): 40 mg via SUBCUTANEOUS
  Filled 2020-10-13 (×3): qty 0.4

## 2020-10-13 MED ORDER — SODIUM CHLORIDE 0.9 % IV SOLN
1.0000 g | INTRAVENOUS | Status: DC
  Administered 2020-10-14 – 2020-10-15 (×2): 1 g via INTRAVENOUS
  Filled 2020-10-13 (×2): qty 10
  Filled 2020-10-13: qty 1

## 2020-10-13 MED ORDER — PRAVASTATIN SODIUM 40 MG PO TABS
40.0000 mg | ORAL_TABLET | Freq: Every day | ORAL | Status: DC
  Administered 2020-10-13 – 2020-10-15 (×3): 40 mg via ORAL
  Filled 2020-10-13 (×3): qty 1

## 2020-10-13 MED ORDER — TRAZODONE HCL 100 MG PO TABS
100.0000 mg | ORAL_TABLET | Freq: Every day | ORAL | Status: DC
  Administered 2020-10-13 – 2020-10-15 (×3): 100 mg via ORAL
  Filled 2020-10-13 (×3): qty 1

## 2020-10-13 MED ORDER — IPRATROPIUM-ALBUTEROL 0.5-2.5 (3) MG/3ML IN SOLN
3.0000 mL | Freq: Four times a day (QID) | RESPIRATORY_TRACT | Status: DC
  Administered 2020-10-13 – 2020-10-14 (×2): 3 mL via RESPIRATORY_TRACT
  Filled 2020-10-13: qty 3

## 2020-10-13 MED ORDER — SODIUM CHLORIDE 0.9 % IV BOLUS
1000.0000 mL | Freq: Once | INTRAVENOUS | Status: AC
  Administered 2020-10-13: 1000 mL via INTRAVENOUS

## 2020-10-13 MED ORDER — DOXYCYCLINE HYCLATE 100 MG PO TABS
100.0000 mg | ORAL_TABLET | Freq: Once | ORAL | Status: AC
  Administered 2020-10-13: 100 mg via ORAL
  Filled 2020-10-13: qty 1

## 2020-10-13 MED ORDER — NICOTINE 14 MG/24HR TD PT24
14.0000 mg | MEDICATED_PATCH | Freq: Every day | TRANSDERMAL | Status: DC
  Filled 2020-10-13: qty 1

## 2020-10-13 MED ORDER — METOPROLOL TARTRATE 50 MG PO TABS
50.0000 mg | ORAL_TABLET | Freq: Two times a day (BID) | ORAL | Status: DC
  Administered 2020-10-13 – 2020-10-15 (×6): 50 mg via ORAL
  Filled 2020-10-13 (×7): qty 1

## 2020-10-13 MED ORDER — SODIUM CHLORIDE 0.9 % IV SOLN
100.0000 mg | Freq: Two times a day (BID) | INTRAVENOUS | Status: DC
  Administered 2020-10-14 (×2): 100 mg via INTRAVENOUS
  Filled 2020-10-13 (×4): qty 100

## 2020-10-13 MED ORDER — SODIUM CHLORIDE 0.9 % IV SOLN
1.0000 g | Freq: Once | INTRAVENOUS | Status: AC
  Administered 2020-10-13: 1 g via INTRAVENOUS
  Filled 2020-10-13: qty 10

## 2020-10-13 MED ORDER — GUAIFENESIN ER 600 MG PO TB12
1200.0000 mg | ORAL_TABLET | Freq: Two times a day (BID) | ORAL | Status: DC
  Administered 2020-10-13 – 2020-10-16 (×7): 1200 mg via ORAL
  Filled 2020-10-13 (×7): qty 2

## 2020-10-13 MED ORDER — INSULIN ASPART 100 UNIT/ML IJ SOLN
0.0000 [IU] | Freq: Three times a day (TID) | INTRAMUSCULAR | Status: DC
  Administered 2020-10-13 – 2020-10-14 (×2): 3 [IU] via SUBCUTANEOUS
  Administered 2020-10-14: 5 [IU] via SUBCUTANEOUS
  Administered 2020-10-14 – 2020-10-15 (×2): 3 [IU] via SUBCUTANEOUS
  Administered 2020-10-15: 8 [IU] via SUBCUTANEOUS
  Administered 2020-10-15: 3 [IU] via SUBCUTANEOUS
  Administered 2020-10-16: 5 [IU] via SUBCUTANEOUS

## 2020-10-13 MED ORDER — MEMANTINE HCL 5 MG PO TABS: 5.0000 mg | ORAL_TABLET | ORAL | Status: DC

## 2020-10-13 MED ORDER — MAGNESIUM SULFATE IN D5W 1-5 GM/100ML-% IV SOLN
1.0000 g | Freq: Once | INTRAVENOUS | Status: AC
  Administered 2020-10-13: 1 g via INTRAVENOUS
  Filled 2020-10-13: qty 100

## 2020-10-13 NOTE — ED Provider Notes (Signed)
[facility] EMERGENCY DEPARTMENT Provider Note   CSN: [id number] Arrival date & time: 10/13/20  1146     History Chief Complaint  Patient presents with  . Palpitations    Herbert Evans is a 75 y.o. male.  The history is provided by the patient and medical records.  Palpitations  Herbert Evans is a 75 y.o. male who presents to the Emergency Department complaining of shortness of breath. He presents the emergency department for evaluation of shortness of breath and elevated heart rate. He has chronic dyspnea on exertion but it has been worse over the last few weeks. He has been using his inhalers more frequently. He has mild associated cough. Early this morning he had worsening symptoms with palpitations and feeling unwell. His wife took his blood pressure and noticed that his heart rate was elevated. No fevers but he did have a temperature to 99 at home today. No prior similar symptoms.    Past Medical History:  Diagnosis Date  . Arthritis    back, hands  . 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 DOE (dyspnea on exertion)   . History of colon cancer    03-01-2005   s/p  sigmoid colectomy w/ negative nodes  (07-27-2019  per pt last colonoscopy 2019 , no recurrence)  . History of Helicobacter pylori infection    09/ 2006  . History of kidney stones   . 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  . Hypertension   . Mixed hyperlipidemia   . Paroxysmal ventricular tachycardia [facility])    cardiologist--- dr Desma Maxim    (07-27-2019  per pt no issues or s&s since 2018)  takes BB  . Pneumonia   . RBBB (right bundle branch block)   . Right ureteral stone   . Type 2 diabetes mellitus (HCC)    followed by pcp --- (07-27-2019  checks blood sugar three times per week in am,  fasting sugar-- 125-160)  . Wears dentures    upper    Patient Active Problem List   Diagnosis Date Noted  .  Community acquired pneumonia 10/13/2020  . PNA (pneumonia) 10/13/2020  . Hyperlipidemia LDL goal <100 01/10/2020  . Medication management 08/30/2019  . Mixed hyperlipidemia 08/30/2019  . Type 2 diabetes mellitus with complication, without long-term current use of insulin (HCC) 08/30/2019    Past Surgical History:  Procedure Laterality Date  . CARDIAC CATHETERIZATION  02-09-2002   dr Sharyn Lull  @[facility]    ef 50-55% w/ 10-15% LAD  . CARDIAC CATHETERIZATION  06-28-2016   dr 06-30-2016 @[facility]    for arrhythmia)--- normal coronaries, mild focal wall motion abnormallity of distal inferapical wall,  severe right innominate / subclavin tortuosity  . CATARACT EXTRACTION W/ INTRAOCULAR LENS  IMPLANT, BILATERAL  2018  . COLON RESECTION SIGMOID  03-01-2005   @[facility]   . CYSTOSCOPY WITH RETROGRADE PYELOGRAM, URETEROSCOPY AND STENT PLACEMENT Right 08/03/2019   Procedure: CYSTOSCOPY WITH RIGHT  RETROGRADE PYELOGRAM, URETEROSCOPY HOLMIUMM LASER AND STENT PLACEMENT;  Surgeon: 03-03-2005, MD;  Location: [facility];  Service: Urology;  Laterality: Right;  . CYSTOSCOPY/RETROGRADE/URETEROSCOPY/STONE EXTRACTION WITH BASKET  1970s  . LUNG REMOVAL, PARTIAL Left 1967   lower lobe for pneumonia per pt  . TONSILLECTOMY AND ADENOIDECTOMY  child       Family History  Problem Relation Age of Onset  . Asthma Mother   .  Heart attack Mother   . Stroke Father   . Lung cancer Sister   . Lung cancer Sister   . Lung cancer Sister     Social History   Tobacco Use  . Smoking status: Former Smoker    Types: Cigars  . Smokeless tobacco: Current User    Types: Snuff, Chew  . Tobacco comment: 01/26/20 dips still last cigar was in 1976  Substance Use Topics  . Alcohol use: No  . Drug use: Never    Home Medications Prior to Admission medications   Medication Sig Start Date End Date Taking? Authorizing Provider  aspirin EC 81 MG tablet Take 81 mg by mouth every morning.    [provider]   Aspirin-Salicylamide-Caffeine (BC FAST PAIN RELIEF) 650-195-33.3 MG PACK Take 1 Package by mouth daily as needed (mild pain).    [provider]  budesonide-formoterol (SYMBICORT) 80-4.5 MCG/ACT inhaler Inhale 2 puffs into the lungs in the morning and at bedtime. With spacer. 01/26/20   Hunsucker, Lesia SagoMatthew R, MD  ezetimibe (ZETIA) 10 MG tablet Take 1 tablet (10 mg total) by mouth daily. 01/10/20   Tobb, Kardie, DO  memantine (NAMENDA) 5 MG tablet Take 5 mg by mouth every other day. 11/02/19   [provider]  metFORMIN (GLUCOPHAGE) 1000 MG tablet Take 1,000 mg by mouth 2 (two) times daily with a meal.     [provider]  metoprolol succinate (TOPROL-XL) 100 MG 24 hr tablet Take 100 mg by mouth daily. Take with or immediately following a meal.     [provider]  Pitavastatin Calcium (LIVALO) 2 MG TABS Take 1 tablet (2 mg total) by mouth daily. 01/10/20   Tobb, Kardie, DO  promethazine-dextromethorphan (PROMETHAZINE-DM) 6.25-15 MG/5ML syrup Take 5 mLs by mouth every 6 (six) hours as needed. 11/02/19   [provider]  traZODone (DESYREL) 100 MG tablet Take 100 mg by mouth at bedtime. 08/02/19   [provider]    Allergies    Atorvastatin and Rosuvastatin  Review of Systems   Review of Systems  Cardiovascular: Positive for palpitations.  All other systems reviewed and are negative.   Physical Exam Updated Vital Signs BP 118/64   Pulse 91   Temp 99.1 F (37.3 C) (Oral)   Resp (!) 29   SpO2 96%   Physical Exam Vitals and nursing note reviewed.  Constitutional:      Appearance: He is well-developed.  HENT:     Head: Normocephalic and atraumatic.  Cardiovascular:     Rate and Rhythm: Tachycardia present. Rhythm irregular.     Heart sounds: No murmur heard.   Pulmonary:     Effort: Pulmonary effort is normal. No respiratory distress.     Comments: Decreased air movement in the left lung fields Abdominal:     Palpations: Abdomen  is soft.     Tenderness: There is no abdominal tenderness. There is no guarding or rebound.  Musculoskeletal:        General: No swelling or tenderness.  Skin:    General: Skin is warm and dry.  Neurological:     Mental Status: He is alert and oriented to person, place, and time.  Psychiatric:        Behavior: Behavior normal.     ED Results / Procedures / Treatments   Labs (all labs ordered are listed, but only abnormal results are displayed) Labs Reviewed  COMPREHENSIVE METABOLIC PANEL - Abnormal; Notable for the following components:  Result Value   Sodium 131 (*)    Chloride 96 (*)    CO2 21 (*)    Glucose, Bld 201 (*)    AST 14 (*)    Total Bilirubin 2.1 (*)    All other components within normal limits  BRAIN NATRIURETIC PEPTIDE - Abnormal; Notable for the following components:   B Natriuretic Peptide 174.3 (*)    All other components within normal limits  CBC WITH DIFFERENTIAL/PLATELET - Abnormal; Notable for the following components:   WBC 13.1 (*)    Neutro Abs 10.5 (*)    Abs Immature Granulocytes 0.08 (*)    All other components within normal limits  LACTIC ACID, PLASMA - Abnormal; Notable for the following components:   Lactic Acid, Venous 2.1 (*)    All other components within normal limits  RESP PANEL BY RT-PCR (FLU A&B, COVID) ARPGX2  CULTURE, BLOOD (ROUTINE X 2)  CULTURE, BLOOD (ROUTINE X 2)  EXPECTORATED SPUTUM ASSESSMENT W GRAM STAIN, RFLX TO RESP C  MAGNESIUM  LACTIC ACID, PLASMA  HIV ANTIBODY (ROUTINE TESTING W REFLEX)  LEGIONELLA PNEUMOPHILA SEROGP 1 UR AG  STREP PNEUMONIAE URINARY ANTIGEN  MYCOPLASMA PNEUMONIAE ANTIBODY, IGM  HEMOGLOBIN A1C  TROPONIN I (HIGH SENSITIVITY)  TROPONIN I (HIGH SENSITIVITY)    EKG EKG Interpretation  Date/Time:  Friday October 13 2020 13:32:54 EDT Ventricular Rate:  101 PR Interval:  201 QRS Duration: 117 QT Interval:  335 QTC Calculation: 435 R Axis:   99 Text Interpretation: Sinus tachycardia Incomplete  right bundle branch block Low voltage, precordial leads Abnormal inferior Q waves Confirmed by Tilden Fossa [phone]) on 10/13/2020 1:46:46 PM   Radiology DG Chest Port 1 View  Result Date: 10/13/2020 CLINICAL DATA:  Shortness of breath for several weeks. EXAM: PORTABLE CHEST 1 VIEW COMPARISON:  August 29, 2020. FINDINGS: Chronic opacity at the left lung base with elevated left hemidiaphragm, volume loss, and left apical scarring. New superimposed airspace opacities in the left lung. Clear right lung. Similar shift of the mediastinum and heart to the left. Cardiomediastinal silhouette is partially obscured with visual portion similar to prior. Calcific atherosclerosis of the aorta. IMPRESSION: 1. New airspace opacities in the left lung, concerning for pneumonia. Recommend imaging follow-up to resolution. 2. Similar additional chronic changes, as detailed above. Electronically Signed   By: Feliberto Harts MD   On: 10/13/2020 12:52    Procedures Procedures   Medications Ordered in ED Medications  sodium chloride 0.9 % bolus 1,000 mL (has no administration in time range)  guaiFENesin (MUCINEX) 12 hr tablet 1,200 mg (has no administration in time range)  ipratropium-albuterol (DUONEB) 0.5-2.5 (3) MG/3ML nebulizer solution 3 mL (has no administration in time range)  aspirin EC tablet 81 mg (has no administration in time range)  ezetimibe (ZETIA) tablet 10 mg (has no administration in time range)  metoprolol tartrate (LOPRESSOR) tablet 50 mg (has no administration in time range)  pravastatin (PRAVACHOL) tablet 40 mg (has no administration in time range)  memantine (NAMENDA) tablet 5 mg (has no administration in time range)  traZODone (DESYREL) tablet 100 mg (has no administration in time range)  fluticasone furoate-vilanterol (BREO ELLIPTA) 100-25 MCG/INH 1 puff (has no administration in time range)  enoxaparin (LOVENOX) injection 40 mg (has no administration in time range)  0.9 %  sodium chloride  infusion (has no administration in time range)  insulin aspart (novoLOG) injection 0-15 Units (has no administration in time range)  cefTRIAXone (ROCEPHIN) 1 g in sodium chloride 0.9 %  100 mL IVPB (has no administration in time range)  doxycycline (VIBRAMYCIN) 100 mg in sodium chloride 0.9 % 250 mL IVPB (has no administration in time range)  cefTRIAXone (ROCEPHIN) 1 g in sodium chloride 0.9 % 100 mL IVPB (1 g Intravenous New Bag/Given 10/13/20 1422)  doxycycline (VIBRA-TABS) tablet 100 mg (100 mg Oral Given 10/13/20 1418)    ED Course  I have reviewed the triage vital signs and the nursing notes.  Pertinent labs & imaging results that were available during my care of the patient were reviewed by me and considered in my medical decision making (see chart for details).    MDM Rules/Calculators/A&P                         patient with history of chronic dyspnea, history of partial pneumonectomy here for evaluation of progressive shortness of breath and tachycardia. Patient is presenting EKG with SVT. He converted spontaneously to sinus rhythm. Initial converting rhythm had significant activity, but on reassessment his rhythm transition to normal sinus rhythm. Chest x-ray is concerning for developing left-sided pneumonia. Will start on antibiotics. Will admit for ongoing treatment. Hospitalist consulted for admission. Patient is in agreement treatment plan.  Final Clinical Impression(s) / ED Diagnoses Final diagnoses:  Community acquired pneumonia of left lung, unspecified part of lung  SVT (supraventricular tachycardia) (HCC)    Rx / DC Orders ED Discharge Orders    None       Tilden Fossa, MD 10/13/20 1523

## 2020-10-13 NOTE — ED Notes (Signed)
Report attempted x2

## 2020-10-13 NOTE — ED Triage Notes (Signed)
Pt here with reports of shob and elevated heart rate onset today. Pt states he did not feel well last night and today his wife took his BP and noticed his heart rate was elevated. Spoke to PCP who advised pt come here for evaluation.

## 2020-10-13 NOTE — ED Notes (Signed)
Report attempted x 1

## 2020-10-13 NOTE — H&P (Addendum)
History and Physical    Herbert Evans ZOX:[id number] DOB: 1945/08/29 DOA: 10/13/2020  PCP: Dema SeverinYork, Regina F, NP (Confirm with patient/family/NH records and if not entered, this has to be entered at [facility] point of entry) Patient coming from: Home  I have personally briefly reviewed patient[facility]  Chief Complaint: Cough, fever  HPI: Herbert Evans is a 75 y.o. male with medical history significant of s/p left lung lobectomy stimulator for severe pneumonia, HTN, IIDM, HLD,, CAD status post partial colectomy, mild dementia, history of paroxysmal ventricular tachycardia on beta-blocker, presented with persistent cough, new onset of fever and shortness of breath.  Patient started to have a dry cough 5 to 6 weeks ago, went to see pulmonologist about 5 weeks ago was given some cough medication, albuterol inhalers and nebulizer.  However, her symptoms of coughing gradually getting worse, for the last 2 to 3 days, patient developed exertional dyspnea and subjective fever and chills.  This morning, wife noticed patient looks pale and check his heart rate was in the 170s and blood pressure in the lower 100, and family called EMS.  Denied night sweats or weight loss. Wife reported no choking or cough after eating food or drinking water.  ED Course: On initial presentation, heart rate 168, suspect SVT on EKG then broke to normal sinus rhythm.  No hypoxia but tachypneic.  Left lung pneumonia on x-ray, WBC 13.  Ceftriaxone and doxycycline started in ED.  Review of Systems: As per HPI otherwise 14 point review of systems negative.    Past Medical History:  Diagnosis Date  . Arthritis    back, hands  . 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 DOE (dyspnea on exertion)   . History of colon cancer    03-01-2005   s/p  sigmoid colectomy w/ negative nodes  (07-27-2019  per pt last colonoscopy 2019 , no  recurrence)  . History of Helicobacter pylori infection    09/ 2006  . History of kidney stones   . 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  . Hypertension   . Mixed hyperlipidemia   . Paroxysmal ventricular tachycardia [facility](HCC)    cardiologist--- dr Desma Maximmcgukin    (07-27-2019  per pt no issues or s&s since 2018)  takes BB  . Pneumonia   . RBBB (right bundle branch block)   . Right ureteral stone   . Type 2 diabetes mellitus (HCC)    followed by pcp --- (07-27-2019  checks blood sugar three times per week in am,  fasting sugar-- 125-160)  . Wears dentures    upper    Past Surgical History:  Procedure Laterality Date  . CARDIAC CATHETERIZATION  02-09-2002   dr Sharyn Lullharwani  @[facility]    ef 50-55% w/ 10-15% LAD  . CARDIAC CATHETERIZATION  06-28-2016   dr Beverely Pacecheek @[facility]    for arrhythmia)--- normal coronaries, mild focal wall motion abnormallity of distal inferapical wall,  severe right innominate / subclavin tortuosity  . CATARACT EXTRACTION W/ INTRAOCULAR LENS  IMPLANT, BILATERAL  2018  . COLON RESECTION SIGMOID  03-01-2005   @[facility]   . CYSTOSCOPY WITH RETROGRADE PYELOGRAM, URETEROSCOPY AND STENT PLACEMENT Right 08/03/2019   Procedure: CYSTOSCOPY WITH RIGHT  RETROGRADE PYELOGRAM, URETEROSCOPY HOLMIUMM LASER AND STENT PLACEMENT;  Surgeon: Bjorn PippinWrenn, John, MD;  [facility] [place];  Service: Urology;  Laterality: Right;  .  CYSTOSCOPY/RETROGRADE/URETEROSCOPY/STONE EXTRACTION WITH BASKET  1970s  . LUNG REMOVAL, PARTIAL Left 1967   lower lobe for pneumonia per pt  . TONSILLECTOMY AND ADENOIDECTOMY  child     reports that he has quit smoking. His smoking use included cigars. His smokeless tobacco use includes snuff and chew. He reports that he does not drink alcohol and does not use drugs.  Allergies  Allergen Reactions  . Atorvastatin Other (See Comments)    myalgias  . Rosuvastatin Other (See Comments)    myalgias    Family History  Problem Relation Age of Onset  . Asthma Mother    . Heart attack Mother   . Stroke Father   . Lung cancer Sister   . Lung cancer Sister   . Lung cancer Sister      Prior to Admission medications   Medication Sig Start Date End Date Taking? Authorizing Provider  aspirin EC 81 MG tablet Take 81 mg by mouth every morning.    [provider]  Aspirin-Salicylamide-Caffeine (BC FAST PAIN RELIEF) 650-195-33.3 MG PACK Take 1 Package by mouth daily as needed (mild pain).    [provider]  budesonide-formoterol (SYMBICORT) 80-4.5 MCG/ACT inhaler Inhale 2 puffs into the lungs in the morning and at bedtime. With spacer. 01/26/20   Hunsucker, Lesia Sago, MD  ezetimibe (ZETIA) 10 MG tablet Take 1 tablet (10 mg total) by mouth daily. 01/10/20   Tobb, Kardie, DO  memantine (NAMENDA) 5 MG tablet Take 5 mg by mouth every other day. 11/02/19   [provider]  metFORMIN (GLUCOPHAGE) 1000 MG tablet Take 1,000 mg by mouth 2 (two) times daily with a meal.     [provider]  metoprolol succinate (TOPROL-XL) 100 MG 24 hr tablet Take 100 mg by mouth daily. Take with or immediately following a meal.     [provider]  Pitavastatin Calcium (LIVALO) 2 MG TABS Take 1 tablet (2 mg total) by mouth daily. 01/10/20   Tobb, Kardie, DO  promethazine-dextromethorphan (PROMETHAZINE-DM) 6.25-15 MG/5ML syrup Take 5 mLs by mouth every 6 (six) hours as needed. 11/02/19   [provider]  traZODone (DESYREL) 100 MG tablet Take 100 mg by mouth at bedtime. 08/02/19   [provider]    Physical Exam: Vitals:   10/13/20 1152 10/13/20 1245 10/13/20 1315 10/13/20 1445  BP: 102/74 131/81 120/67 118/64  Pulse: (!) 169 (!) 106 (!) 103 91  Resp: 20 (!) 24 (!) 22 (!) 29  Temp: 99.1 F (37.3 C)     TempSrc: Oral     SpO2: 99% 94% 97% 96%    Constitutional: NAD, calm, comfortable Vitals:   10/13/20 1152 10/13/20 1245 10/13/20 1315 10/13/20 1445  BP: 102/74 131/81 120/67 118/64  Pulse: (!) 169 (!) 106 (!) 103 91   Resp: 20 (!) 24 (!) 22 (!) 29  Temp: 99.1 F (37.3 C)     TempSrc: Oral     SpO2: 99% 94% 97% 96%   Eyes: PERRL, lids and conjunctivae normal ENMT: Mucous membranes are dry. Posterior pharynx clear of any exudate or lesions.Normal dentition.  Neck: normal, supple, no masses, no thyromegaly Respiratory: Diminished breathing sound in the left lower field but no crackles or wheezing bilaterally.  Increasing respiratory effort. No accessory muscle use.  Cardiovascular: Regular rate and rhythm, no murmurs / rubs / gallops. No extremity edema. 2+ pedal pulses. No carotid bruits.  Abdomen: no tenderness, no masses palpated. No hepatosplenomegaly. Bowel sounds positive.  Musculoskeletal: no clubbing /  cyanosis. No joint deformity upper and lower extremities. Good ROM, no contractures. Normal muscle tone.  Skin: no rashes, lesions, ulcers. No induration Neurologic: CN 2-12 grossly intact. Sensation intact, DTR normal. Strength 5/5 in all 4.  Psychiatric: Normal judgment and insight. Alert and oriented x 3. Normal mood.     Labs on Admission: I have personally reviewed following labs and imaging studies  CBC: Recent Labs  Lab 10/13/20 1206  WBC 13.1*  NEUTROABS 10.5*  HGB 16.0  HCT 46.7  MCV 91.9  PLT 237   Basic Metabolic Panel: Recent Labs  Lab 10/13/20 1206  NA 131*  K 4.1  CL 96*  CO2 21*  GLUCOSE 201*  BUN 13  CREATININE 1.12  CALCIUM 9.2  MG 1.8   GFR: CrCl cannot be calculated (Unknown ideal weight.). Liver Function Tests: Recent Labs  Lab 10/13/20 1206  AST 14*  ALT 13  ALKPHOS 61  BILITOT 2.1*  PROT 7.6  ALBUMIN 3.5   No results for input(s): LIPASE, AMYLASE in the last 168 hours. No results for input(s): AMMONIA in the last 168 hours. Coagulation Profile: No results for input(s): INR, PROTIME in the last 168 hours. Cardiac Enzymes: No results for input(s): CKTOTAL, CKMB, CKMBINDEX, TROPONINI in the last 168 hours. BNP (last 3 results) No results for  input(s): PROBNP in the last 8760 hours. HbA1C: No results for input(s): HGBA1C in the last 72 hours. CBG: No results for input(s): GLUCAP in the last 168 hours. Lipid Profile: No results for input(s): CHOL, HDL, LDLCALC, TRIG, CHOLHDL, LDLDIRECT in the last 72 hours. Thyroid Function Tests: No results for input(s): TSH, T4TOTAL, FREET4, T3FREE, THYROIDAB in the last 72 hours. Anemia Panel: No results for input(s): VITAMINB12, FOLATE, FERRITIN, TIBC, IRON, RETICCTPCT in the last 72 hours. Urine analysis:    Component Value Date/Time   COLORURINE YELLOW 05/12/2019 1410   APPEARANCEUR CLEAR 05/12/2019 1410   LABSPEC 1.004 (L) 05/12/2019 1410   PHURINE 6.0 05/12/2019 1410   GLUCOSEU 50 (A) 05/12/2019 1410   HGBUR LARGE (A) 05/12/2019 1410   BILIRUBINUR NEGATIVE 05/12/2019 1410   KETONESUR 5 (A) 05/12/2019 1410   PROTEINUR NEGATIVE 05/12/2019 1410   NITRITE NEGATIVE 05/12/2019 1410   LEUKOCYTESUR NEGATIVE 05/12/2019 1410    Radiological Exams on Admission: DG Chest Port 1 View  Result Date: 10/13/2020 CLINICAL DATA:  Shortness of breath for several weeks. EXAM: PORTABLE CHEST 1 VIEW COMPARISON:  August 29, 2020. FINDINGS: Chronic opacity at the left lung base with elevated left hemidiaphragm, volume loss, and left apical scarring. New superimposed airspace opacities in the left lung. Clear right lung. Similar shift of the mediastinum and heart to the left. Cardiomediastinal silhouette is partially obscured with visual portion similar to prior. Calcific atherosclerosis of the aorta. IMPRESSION: 1. New airspace opacities in the left lung, concerning for pneumonia. Recommend imaging follow-up to resolution. 2. Similar additional chronic changes, as detailed above. Electronically Signed   By: Feliberto Harts MD   On: 10/13/2020 12:52    EKG: Independently reviewed. SVT on first EKG, then broke to normal sinus rhythm, no PR or QTC issue.  Assessment/Plan Active Problems:   Community  acquired pneumonia   PNA (pneumonia)  (please populate well all problems here in Problem List. (For example, if patient is on BP meds at home and you resume or decide to hold them, it is a problem that needs to be her. Same for CAD, COPD, HLD and so on)  Left lung PNA -Atypical like changes  on chest x-ray and no crackles on the left some recent concern about atypical pneumonia.  Continue current CAP coverage of ceftriaxone and doxycycline.  Send atypical studies. -CT chest to better characterize left lung changes. -Breathing medications.  Paroxysmal SVT with Hx of paroxysmal V-tach -Resolved. No chest pain and Trop negative x1. -Mg=1.8, will give one dose of IV Mg -Continue metoprolol.  Hyponatremia -Hypovolemic, IVF then re-evaluate -Rule out atypical PNA.  IIDM -Hold metformin given elevation of lactic acid level. -Sliding scale  Chewing tobacco use -Nicotine patch  DVT prophylaxis: Lovenox Code Status: Full code Family Communication: Wife at bedside Disposition Plan: Expect more than 2 midnight hospital stay Consults called: None Admission status: Tele admit  Emeline General MD [facility] Pager [phone] If 7PM-7AM, please contact night-coverage [email or web address] Password [facility]  10/13/2020, 3:23 PM

## 2020-10-14 DIAGNOSIS — I471 Supraventricular tachycardia: Secondary | ICD-10-CM

## 2020-10-14 DIAGNOSIS — E871 Hypo-osmolality and hyponatremia: Secondary | ICD-10-CM

## 2020-10-14 DIAGNOSIS — Z720 Tobacco use: Secondary | ICD-10-CM

## 2020-10-14 LAB — TSH: TSH: 2.742 u[IU]/mL (ref 0.350–4.500)

## 2020-10-14 LAB — GLUCOSE, CAPILLARY
Glucose-Capillary: 182 mg/dL — ABNORMAL HIGH (ref 70–99)
Glucose-Capillary: 215 mg/dL — ABNORMAL HIGH (ref 70–99)
Glucose-Capillary: 180 mg/dL — ABNORMAL HIGH (ref 70–99)
Glucose-Capillary: 168 mg/dL — ABNORMAL HIGH (ref 70–99)

## 2020-10-14 LAB — MAGNESIUM: Magnesium: 1.8 mg/dL (ref 1.7–2.4)

## 2020-10-14 LAB — BRAIN NATRIURETIC PEPTIDE: B Natriuretic Peptide: 184 pg/mL — ABNORMAL HIGH (ref 0.0–100.0)

## 2020-10-14 MED ORDER — METOPROLOL TARTRATE 5 MG/5ML IV SOLN: 2.5000 mg | INTRAVENOUS | Status: DC | PRN

## 2020-10-14 MED ORDER — METOPROLOL TARTRATE 5 MG/5ML IV SOLN
2.5000 mg | Freq: Once | INTRAVENOUS | Status: AC
  Administered 2020-10-14: 2.5 mg via INTRAVENOUS
  Filled 2020-10-14: qty 5

## 2020-10-14 MED ORDER — NICOTINE 7 MG/24HR TD PT24
7.0000 mg | MEDICATED_PATCH | Freq: Every day | TRANSDERMAL | Status: DC
  Filled 2020-10-14 (×2): qty 1

## 2020-10-14 MED ORDER — IPRATROPIUM-ALBUTEROL 0.5-2.5 (3) MG/3ML IN SOLN: 3.0000 mL | RESPIRATORY_TRACT | Status: DC | PRN

## 2020-10-14 MED ORDER — DOXYCYCLINE HYCLATE 100 MG PO TABS
100.0000 mg | ORAL_TABLET | Freq: Two times a day (BID) | ORAL | Status: DC
  Administered 2020-10-14 – 2020-10-16 (×4): 100 mg via ORAL
  Filled 2020-10-14 (×4): qty 1

## 2020-10-14 NOTE — Progress Notes (Signed)
RN administered verbal order of 2.5 mg dose IV Metoprolol.  Patient's heart rate decreased to 80s-90s. EKG obtained.

## 2020-10-14 NOTE — Evaluation (Signed)
Physical Therapy Evaluation Patient Details Name: Herbert Evans MRN: [id number] DOB: Jun 16, 1945 Today's Date: 10/14/2020   History of Present Illness  Herbert Evans was admitted on 10/13/20 for cough and fever.  In the ED his HR 168, suspect SVT on EKG, L lung PNA on x-ray.  Pt with significant PMH of DM2, RBBB, Paroxysmal ventricular tachycardia, HTN, DOE, L lower lobectomy, sigmoid colon resection, mild dementia, CAD.  Clinical Impression  Pt with mild gait instability which pt reports is normal without h/o falls.  His HR is all over the place, starting in the 70s, increasing to 130s and 140s, returning to 80s with standing rest and back up again.  Monitor indicating runs of v tach/SVT, PVCs. Pt reports he can feel his heart race and it makes him feel weak and tired, but he is able to continue with me taking short standing rest breaks ~ every 100' due to HR spikes.  RN aware and monitoring.  PT to follow acutely for deficits listed below.    Follow Up Recommendations No PT follow up    Equipment Recommendations  None recommended by PT    Recommendations for Other Services       Precautions / Restrictions Precautions Precautions: Fall;Other (comment) Precaution Comments: per pt he stumbles quite a bit, monitor HR and rhythm      Mobility  Bed Mobility Overal bed mobility: Modified Independent                  Transfers Overall transfer level: Modified independent                  Ambulation/Gait Ambulation/Gait assistance: Min guard Gait Distance (Feet): 450 Feet Assistive device: None Gait Pattern/deviations: Step-through pattern;Staggering left;Staggering right Gait velocity: decreased Gait velocity interpretation: 1.31 - 2.62 ft/sec, indicative of limited community ambulator General Gait Details: Pt with mildly staggering gait pattern, min guard assist for safety.  HR started at 77, spiked to 137, took a standing rest break for ~1 min and came back to the  80s, walked again, HR up to 147, standing rest for ~1 min and back down, repeated throughout session.  Monitor indicating V tach/SVT.  RN made aware.  Stairs            Wheelchair Mobility    Modified Rankin (Stroke Patients Only)       Balance Overall balance assessment: Mild deficits observed, not formally tested                                           Pertinent Vitals/Pain Pain Assessment: No/denies pain    Home Living Family/patient expects to be discharged to:: Private residence Living Arrangements: Spouse/significant other Available Help at Discharge: Family;Available 24 hours/day;Other (Comment) (wife can only provide supervision, granddaughter to stay with them this summer to help.) Type of Home: House Home Access: Level entry     Home Layout: One level        Prior Function Level of Independence: Independent         Comments: walks without assistive device, still drives, retired Aeronautical engineer, Theme park manager.     Hand Dominance   Dominant Hand: Right    Extremity/Trunk Assessment   Upper Extremity Assessment Upper Extremity Assessment: Overall WFL for tasks assessed    Lower Extremity Assessment Lower Extremity Assessment: Overall WFL for tasks assessed    Cervical /  Trunk Assessment Cervical / Trunk Assessment: Normal  Communication   Communication: No difficulties  Cognition Arousal/Alertness: Awake/alert Behavior During Therapy: Flat affect Overall Cognitive Status: No family/caregiver present to determine baseline cognitive functioning                                 General Comments: Flat, mildly slow processing      General Comments      Exercises     Assessment/Plan    PT Assessment Patient needs continued PT services  PT Problem List Decreased balance;Cardiopulmonary status limiting activity       PT Treatment Interventions DME instruction;Gait training;Therapeutic exercise;Therapeutic  activities;Functional mobility training;Neuromuscular re-education;Balance training;Patient/family education;Cognitive remediation    PT Goals (Current goals can be found in the Care Plan section)  Acute Rehab PT Goals Patient Stated Goal: to get home soon PT Goal Formulation: With patient Time For Goal Achievement: 10/28/20 Potential to Achieve Goals: Good    Frequency Min 3X/week   Barriers to discharge        Co-evaluation               AM-PAC PT "6 Clicks" Mobility  Outcome Measure Help needed turning from your back to your side while in a flat bed without using bedrails?: None Help needed moving from lying on your back to sitting on the side of a flat bed without using bedrails?: None Help needed moving to and from a bed to a chair (including a wheelchair)?: A Little Help needed standing up from a chair using your arms (e.g., wheelchair or bedside chair)?: None Help needed to walk in hospital room?: A Little Help needed climbing 3-5 steps with a railing? : A Little 6 Click Score: 21    End of Session   Activity Tolerance: Other (comment) (limited by high HR and arythmia) Patient left: in bed;with call bell/phone within reach Nurse Communication: Mobility status;Other (comment) (HR changes) PT Visit Diagnosis: Difficulty in walking, not elsewhere classified (R26.2)    Time: 2353-6144 PT Time Calculation (min) (ACUTE ONLY): 18 min   Charges:   PT Evaluation $PT Eval Moderate Complexity: 1 Mod          Corinna Capra, PT, DPT  Acute Rehabilitation Ortho Tech Supervisor [phone] pager [phone] office

## 2020-10-14 NOTE — Progress Notes (Addendum)
Telemetry called RN stating pt in SVT & having trigeminy.  Pt reported feeling 'heart racing' and 'tired'.  RN administered scheduled Metoprolol & paged MD for IV dose.

## 2020-10-14 NOTE — Progress Notes (Addendum)
PROGRESS NOTE    Herbert Evans  AYT:[id number]  DOB: 1945-07-12  PCP: Dema Severin, NP Admit date:10/13/2020 Chief compliant: 75 y.o. male with medical history significant of s/p left lung lobectomy stimulator for severe pneumonia, HTN, IIDM, HLD,, CAD status post partial colectomy, mild dementia, history of paroxysmal ventricular tachycardia on beta-blocker, presented with persistent cough for few weeks, new onset of fever and shortness of breath. On the DOA, wife noticed patient looks pale and check his heart rate was in the 170s and blood pressure in the lower 100, and family called EMS. ED Course: Afebrile,heart rate 168, suspect SVT on EKG then broke to normal sinus rhythm.  No hypoxia but tachypneic.  Left lung pneumonia on x-ray, WBC 13.  Ceftriaxone and doxycycline started in ED.COVID and resp viral panel negative.  Hospital course: Patient admitted to [facility] for pneumonia and Chest CT ordered for further evaluation of atypical findings on CXR. CT chest shows: postoperative findings of left lower lobectomy WITH acutely superimposed heterogeneous and ground-glass airspace disease throughout the left upper lobe and scattered in the dependent right lower lobe. PA dilation. Findings are consistent with multifocal Infection, Pulm HTN.  Subjective:  Called to bedside by nurse given heart rate sustained 130-140s after p.o. metoprolol.  On my arrival patient laying comfortably, reports feeling uneasy whenever he has tachycardia episodes.  Does not particularly complain of palpitations.  Wife at bedside feels sometimes he does experience palpitations.  He appears to have wet cough.  Daughter at bedside and has questions regarding CT findings.  Objective: Vitals:   10/13/20 2053 10/14/20 0741 10/14/20 0900 10/14/20 1144  BP: (!) 110/59 108/61    Pulse: 72 60    Resp: 19 (!) 22    Temp: 98 F (36.7 C) 98.7 F (37.1 C)  98.7 F (37.1 C)  TempSrc: Oral Oral  Oral  SpO2: 95% 96% 95%   Weight:       Height:        Intake/Output Summary (Last 24 hours) at 10/14/2020 1158 Last data filed at 10/14/2020 0300 Gross per 24 hour  Intake 1413.33 ml  Output --  Net 1413.33 ml   Filed Weights   10/13/20 1525  Weight: 86.2 kg    Physical Examination:  General: Moderately built, no acute distress noted Head ENT: Atraumatic normocephalic, PERRLA, neck supple Heart: S1-S2 heard, tachycardic, no murmurs.  No leg edema noted Lungs: Equal air entry bilaterally, no rhonchi or rales on exam, no accessory muscle use Abdomen: Bowel sounds heard, soft, nontender, nondistended. No organomegaly.  No CVA tenderness Extremities: No pedal edema.  No cyanosis or clubbing. Neurological: Awake alert oriented x3, no focal weakness or numbness, strength and sensations to crude touch intact Skin: No wounds or rashes.    Data Reviewed: I have personally reviewed following labs and imaging studies  CBC: Recent Labs  Lab 10/13/20 1206  WBC 13.1*  NEUTROABS 10.5*  HGB 16.0  HCT 46.7  MCV 91.9  PLT 237   Basic Metabolic Panel: Recent Labs  Lab 10/13/20 1206 10/13/20 2015  NA 131*  --   K 4.1  --   CL 96*  --   CO2 21*  --   GLUCOSE 201*  --   BUN 13  --   CREATININE 1.12  --   CALCIUM 9.2  --   MG 1.8  --   PHOS  --  2.6   GFR: Estimated Creatinine Clearance: 60.7 mL/min (by C-G formula based on SCr of  1.12 mg/dL). Liver Function Tests: Recent Labs  Lab 10/13/20 1206  AST 14*  ALT 13  ALKPHOS 61  BILITOT 2.1*  PROT 7.6  ALBUMIN 3.5   No results for input(s): LIPASE, AMYLASE in the last 168 hours. No results for input(s): AMMONIA in the last 168 hours. Coagulation Profile: No results for input(s): INR, PROTIME in the last 168 hours. Cardiac Enzymes: No results for input(s): CKTOTAL, CKMB, CKMBINDEX, TROPONINI in the last 168 hours. BNP (last 3 results) No results for input(s): PROBNP in the last 8760 hours. HbA1C: No results for input(s): HGBA1C in the last 72  hours. CBG: Recent Labs  Lab 10/13/20 1723 10/13/20 2138 10/14/20 0739 10/14/20 1140  GLUCAP 186* 202* 182* 215*   Lipid Profile: No results for input(s): CHOL, HDL, LDLCALC, TRIG, CHOLHDL, LDLDIRECT in the last 72 hours. Thyroid Function Tests: Recent Labs    10/13/20 2015  TSH 3.080   Anemia Panel: No results for input(s): VITAMINB12, FOLATE, FERRITIN, TIBC, IRON, RETICCTPCT in the last 72 hours. Sepsis Labs: Recent Labs  Lab 10/13/20 1338 10/13/20 2015  LATICACIDVEN 2.1* 2.2*    Recent Results (from the past 240 hour(s))  Resp Panel by RT-PCR (Flu A&B, Covid) Nasopharyngeal Swab     Status: None   Collection Time: 10/13/20 12:38 PM   Specimen: Nasopharyngeal Swab; Nasopharyngeal(NP) swabs in vial transport medium  Result Value Ref Range Status   SARS Coronavirus 2 by RT PCR NEGATIVE NEGATIVE Final    Comment: (NOTE) SARS-CoV-2 target nucleic acids are NOT DETECTED.  The SARS-CoV-2 RNA is generally detectable in upper respiratory specimens during the acute phase of infection. The lowest concentration of SARS-CoV-2 viral copies this assay can detect is 138 copies/mL. A negative result does not preclude SARS-Cov-2 infection and should not be used as the sole basis for treatment or other patient management decisions. A negative result may occur with  improper specimen collection/handling, submission of specimen other than nasopharyngeal swab, presence of viral mutation(s) within the areas targeted by this assay, and inadequate number of viral copies(<138 copies/mL). A negative result must be combined with clinical observations, patient history, and epidemiological information. The expected result is Negative.  Fact Sheet for Patients:  BloggerCourse.com  Fact Sheet for Healthcare Providers:  SeriousBroker.it  This test is no t yet approved or cleared by the [place] FDA and  has been authorized for detection  and/or diagnosis of SARS-CoV-2 by FDA under an Emergency Use Authorization (EUA). This EUA will remain  in effect (meaning this test can be used) for the duration of the COVID-19 declaration under Section 564(b)(1) of the Act, 21 U.S.C.section 360bbb-3(b)(1), unless the authorization is terminated  or revoked sooner.       Influenza A by PCR NEGATIVE NEGATIVE Final   Influenza B by PCR NEGATIVE NEGATIVE Final    Comment: (NOTE) The Xpert Xpress SARS-CoV-2/FLU/RSV plus assay is intended as an aid in the diagnosis of influenza from Nasopharyngeal swab specimens and should not be used as a sole basis for treatment. Nasal washings and aspirates are unacceptable for Xpert Xpress SARS-CoV-2/FLU/RSV testing.  Fact Sheet for Patients: BloggerCourse.com  Fact Sheet for Healthcare Providers: SeriousBroker.it  This test is not yet approved or cleared by the [place] FDA and has been authorized for detection and/or diagnosis of SARS-CoV-2 by FDA under an Emergency Use Authorization (EUA). This EUA will remain in effect (meaning this test can be used) for the duration of the COVID-19 declaration under Section 564(b)(1) of the Act, 21  U.S.C. section 360bbb-3(b)(1), unless the authorization is terminated or revoked.  Performed at [facility], [address] [place], [place] [address]       Radiology Studies: CT CHEST WO CONTRAST  Result Date: 10/13/2020 CLINICAL DATA:  Shortness of breath, pneumonia, history of pneumonectomy EXAM: CT CHEST WITHOUT CONTRAST TECHNIQUE: Multidetector CT imaging of the chest was performed following the standard protocol without IV contrast. COMPARISON:  09/17/2017 FINDINGS: Cardiovascular: Aortic atherosclerosis. Normal heart size. Left coronary artery calcifications. No pericardial effusion. Enlargement of the main pulmonary artery measuring up to 3.7 cm in caliber. Mediastinum/Nodes: Leftward shift  of the mediastinal contents. No enlarged mediastinal, hilar, or axillary lymph nodes. Thyroid gland, trachea, and esophagus demonstrate no significant findings. Lungs/Pleura: Redemonstrated postoperative findings of left lower lobectomy. There is acutely superimposed heterogeneous and ground-glass airspace disease throughout the left upper lobe and scattered in the dependent right lower lobe (series 3, image 44, 97). No pleural effusion or pneumothorax. Upper Abdomen: No acute abnormality.  Hepatic steatosis. Musculoskeletal: No chest wall mass or suspicious bone lesions identified. Disc degenerative disease and ankylosis of the thoracic spine. IMPRESSION: 1. Redemonstrated postoperative findings of left lower lobectomy. 2. There is acutely superimposed heterogeneous and ground-glass airspace disease throughout the left upper lobe and scattered in the dependent right lower lobe. Findings are consistent with multifocal infection. 3. Enlargement of the main pulmonary artery measuring up to 3.7 cm in caliber, as can be seen in pulmonary hypertension. 4. Coronary artery disease. 5. Hepatic steatosis. Aortic Atherosclerosis (ICD10-I70.0). Electronically Signed   By: Lauralyn PrimesAlex  Bibbey M.D.   On: 10/13/2020 15:53   DG Chest Port 1 View  Result Date: 10/13/2020 CLINICAL DATA:  Shortness of breath for several weeks. EXAM: PORTABLE CHEST 1 VIEW COMPARISON:  August 29, 2020. FINDINGS: Chronic opacity at the left lung base with elevated left hemidiaphragm, volume loss, and left apical scarring. New superimposed airspace opacities in the left lung. Clear right lung. Similar shift of the mediastinum and heart to the left. Cardiomediastinal silhouette is partially obscured with visual portion similar to prior. Calcific atherosclerosis of the aorta. IMPRESSION: 1. New airspace opacities in the left lung, concerning for pneumonia. Recommend imaging follow-up to resolution. 2. Similar additional chronic changes, as detailed above.  Electronically Signed   By: Feliberto HartsFrederick S Jones MD   On: 10/13/2020 12:52      Scheduled Meds: . aspirin EC  81 mg Oral q morning  . enoxaparin (LOVENOX) injection  40 mg Subcutaneous Q24H  . ezetimibe  10 mg Oral Daily  . fluticasone furoate-vilanterol  1 puff Inhalation Daily  . guaiFENesin  1,200 mg Oral BID  . insulin aspart  0-15 Units Subcutaneous TID WC  . ipratropium-albuterol  3 mL Nebulization Q6H  . memantine  5 mg Oral BID  . metoprolol tartrate  50 mg Oral BID  . nicotine  14 mg Transdermal Daily  . pravastatin  40 mg Oral q1800  . traZODone  100 mg Oral QHS   Continuous Infusions: . cefTRIAXone (ROCEPHIN)  IV 1 g (10/14/20 1042)  . doxycycline (VIBRAMYCIN) IV 100 mg (10/14/20 1120)   Assessment and plan:   1.  Multifocal pneumonia: WBC 13 k.  Afebrile.  CT chest as mentioned above shows heterogeneous/groundglass airspace disease along left upper lobe and dependent right lower lobe with pulmonary artery dilatation/pulmonary hypertension.  Fortunately saturating well on room air, at least at rest.  Continue Rocephin/doxycycline and monitor response.  Viral panel/COVID-19 negative.  Prior lobectomy history-low baseline lung  reserve.  Monitor closely.  Will obtain sputum culture if able to provide sample.  Neb treatments as needed.  Check BNP to rule out fluid component.  2.  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  Episodes are not very frequent according to wife at bedside.  Likely exacerbated currently by problem #1.  Bedside monitor showing narrow complex tachycardia on my evaluation today.  Additional metoprolol 2.5 mg IV given and ordered EKG once heart rate improved to rule out underlying A. fib/other arrhythmias.  Last echo in 2021 showed grade 2 diastolic dysfunction with EF 60%.  Will repeat echo.  Check TSH although not very reliable in acute illness.  Magnesium 1.8 on presentation and he did receive 1 dose of IV mag-will  repeat level.    3.  Mild hyponatremia: Sodium 131 on presentation.  Felt likely secondary to poor oral intake.  Received IV hydration overnight.  Encourage oral fluid intake repeat BMP.  4.  Diabetes mellitus: Holding metformin in the setting of acute illness and variable oral intake.Sliding scale for now.   5.  Tobacco use: Apparently chews tobacco, on nicotine patch.  Will need to avoid in the setting of problem #2.  Risks of oral cancer to be discussed prior to discharge. DVT prophylaxis: Lovenox Code Status: Full code Family / Patient Communication: Discussed with wife and daughter at bedside Disposition Plan:   Status is: Inpatient  Remains inpatient appropriate because:Hemodynamically unstable-recurrent supraventricular arrhythmia in the setting of multifocal pneumonia.   Dispo: The patient is from: Home              Anticipated d/c is to: Home              Patient currently is not medically stable to d/c.   Difficult to place patient No   Time spent: 35 mins   >50% time spent in discussions with care team and coordination of care.    Alessandra Bevels, MD [facility] Pager in [place]  If 7PM-7AM, please contact night-coverage [email or web address] 10/14/2020, 11:58 AM

## 2020-10-15 ENCOUNTER — Inpatient Hospital Stay (HOSPITAL_COMMUNITY): Payer: Medicare Other

## 2020-10-15 ENCOUNTER — Other Ambulatory Visit (HOSPITAL_COMMUNITY): Payer: Medicare Other

## 2020-10-15 DIAGNOSIS — I5031 Acute diastolic (congestive) heart failure: Secondary | ICD-10-CM

## 2020-10-15 LAB — CBC
HCT: 40.7 % (ref 39.0–52.0)
Hemoglobin: 14 g/dL (ref 13.0–17.0)
MCH: 31 pg (ref 26.0–34.0)
MCHC: 34.4 g/dL (ref 30.0–36.0)
MCV: 90.2 fL (ref 80.0–100.0)
Platelets: 209 10*3/uL (ref 150–400)
RBC: 4.51 MIL/uL (ref 4.22–5.81)
RDW: 12 % (ref 11.5–15.5)
WBC: 6 10*3/uL (ref 4.0–10.5)
nRBC: 0 % (ref 0.0–0.2)

## 2020-10-15 LAB — BASIC METABOLIC PANEL
Anion gap: 8 (ref 5–15)
BUN: 12 mg/dL (ref 8–23)
CO2: 24 mmol/L (ref 22–32)
Calcium: 8.6 mg/dL — ABNORMAL LOW (ref 8.9–10.3)
Chloride: 100 mmol/L (ref 98–111)
Creatinine, Ser: 0.88 mg/dL (ref 0.61–1.24)
GFR, Estimated: >60 mL/min (ref >60)
Glucose, Bld: 160 mg/dL — ABNORMAL HIGH (ref 70–99)
Potassium: 4.2 mmol/L (ref 3.5–5.1)
Sodium: 132 mmol/L — ABNORMAL LOW (ref 135–145)

## 2020-10-15 LAB — GLUCOSE, CAPILLARY
Glucose-Capillary: 176 mg/dL — ABNORMAL HIGH (ref 70–99)
Glucose-Capillary: 198 mg/dL — ABNORMAL HIGH (ref 70–99)
Glucose-Capillary: 270 mg/dL — ABNORMAL HIGH (ref 70–99)
Glucose-Capillary: 170 mg/dL — ABNORMAL HIGH (ref 70–99)

## 2020-10-15 LAB — BRAIN NATRIURETIC PEPTIDE: B Natriuretic Peptide: 253.4 pg/mL — ABNORMAL HIGH (ref 0.0–100.0)

## 2020-10-15 LAB — TSH: TSH: 2.336 u[IU]/mL (ref 0.350–4.500)

## 2020-10-15 LAB — C-REACTIVE PROTEIN: CRP: 17.4 mg/dL — ABNORMAL HIGH (ref <1.0)

## 2020-10-15 LAB — ECHOCARDIOGRAM COMPLETE
Area-P 1/2: 3.99 cm2
Height: 70 in
S' Lateral: 3.9 cm
Weight: 3040 oz

## 2020-10-15 LAB — PROCALCITONIN: Procalcitonin: <0.1 ng/mL

## 2020-10-15 LAB — MAGNESIUM: Magnesium: 1.8 mg/dL (ref 1.7–2.4)

## 2020-10-15 MED ORDER — FUROSEMIDE 40 MG PO TABS
40.0000 mg | ORAL_TABLET | Freq: Once | ORAL | Status: AC
  Administered 2020-10-15: 40 mg via ORAL
  Filled 2020-10-15: qty 1

## 2020-10-15 NOTE — Progress Notes (Signed)
  Echocardiogram 2D Echocardiogram has been performed.  Delcie Roch 10/15/2020, 3:52 PM

## 2020-10-15 NOTE — Progress Notes (Signed)
PROGRESS NOTE                                                                                                                                                                                                             Patient Demographics:    Herbert Evans, is a 75 y.o. male, DOB - [birth year], QPR:[id number]  Outpatient Primary MD for the patient is Dema Severin, NP    LOS - 2  Admit date - 10/13/2020    Chief Complaint  Patient presents with  . Palpitations       Brief Narrative (HPI from H&P) 75 y.o.malewith medical history significant ofs/p left lunglobectomy stimulator for severe pneumonia,HTN, IIDM,HLD,, CAD status post partial colectomy, mild dementia, history of paroxysmal ventricular tachycardia on beta-blocker, presented with persistent cough for few weeks, new onset of fever and shortness of breath along with fast heartbeat, he was diagnosed with multifocal pneumonia along with SVT and admitted to the hospital.   Subjective:    Herbert Evans today has, No headache, No chest pain, No abdominal pain - No Nausea, No new weakness tingling or numbness, mild productive cough but no shortness of breath.   Assessment  & Plan :     1. Acute Hypoxic Resp. Failure due to multifocal pneumonia - he does have a productive cough and has been placed on IV Rocephin and doxycycline, clinically improving, also BNP slightly elevated hence a dose of Lasix will be provided on 10/15/2020.  Obtain echocardiogram along with sputum gram stain and culture, trend procalcitonin.  Note he has history of left-sided lobectomy and follows with Dr. Delton Coombes requested to follow with Dr. Delton Coombes 1 to 2 weeks after discharge.  Encouraged the patient to sit up in chair in the daytime use I-S and flutter valve for pulmonary toiletry.  Will advance activity and titrate down oxygen as possible.  2.  SVT.  Currently stable on beta-blocker which will be  continued, TSH is stable echocardiogram pending.  Outpatient cardiology follow-up post discharge.  3.  History of chewing tobacco.  Counseled to quit.  4.  Dyslipidemia.  On statin continue.  5.  DM type II.  On sliding scale.  No results found for: HGBA1C CBG (last 3)  Recent Labs    10/14/20 1655 10/14/20 2007 10/15/20 0805  GLUCAP 180* 168* 176*  Condition - Guarded  Family Communication  : Son and wife bedside  Code Status :  Full  Consults  :  None  PUD Prophylaxis :    Procedures  :     TTE      Disposition Plan  :    Status is: Inpatient  Remains inpatient appropriate because:IV treatments appropriate due to intensity of illness or inability to take PO   Dispo: The patient is from: Home              Anticipated d/c is to: Home              Patient currently is not medically stable to d/c.   Difficult to place patient No   DVT Prophylaxis  :    enoxaparin (LOVENOX) injection 40 mg Start: 10/13/20 1730     Lab Results  Component Value Date   PLT 209 10/15/2020    Diet :  Diet Order            Diet Carb Modified Fluid consistency: Thin; Room service appropriate? Yes  Diet effective now                  Inpatient Medications  Scheduled Meds: . aspirin EC  81 mg Oral q morning  . doxycycline  100 mg Oral Q12H  . enoxaparin (LOVENOX) injection  40 mg Subcutaneous Q24H  . ezetimibe  10 mg Oral Daily  . fluticasone furoate-vilanterol  1 puff Inhalation Daily  . guaiFENesin  1,200 mg Oral BID  . insulin aspart  0-15 Units Subcutaneous TID WC  . memantine  5 mg Oral BID  . metoprolol tartrate  50 mg Oral BID  . nicotine  7 mg Transdermal Daily  . pravastatin  40 mg Oral q1800  . traZODone  100 mg Oral QHS   Continuous Infusions: . cefTRIAXone (ROCEPHIN)  IV 1 g (10/14/20 1042)   PRN Meds:.ipratropium-albuterol, metoprolol tartrate  Antibiotics  :    Anti-infectives (From admission, onward)   Start     Dose/Rate Route  Frequency Ordered Stop   10/14/20 2200  doxycycline (VIBRA-TABS) tablet 100 mg        100 mg Oral Every 12 hours 10/14/20 1213     10/14/20 1200  cefTRIAXone (ROCEPHIN) 1 g in sodium chloride 0.9 % 100 mL IVPB        1 g 200 mL/hr over 30 Minutes Intravenous Every 24 hours 10/13/20 1522     10/13/20 2300  doxycycline (VIBRAMYCIN) 100 mg in sodium chloride 0.9 % 250 mL IVPB  Status:  Discontinued        100 mg 125 mL/hr over 120 Minutes Intravenous Every 12 hours 10/13/20 1522 10/14/20 1213   10/13/20 1330  cefTRIAXone (ROCEPHIN) 1 g in sodium chloride 0.9 % 100 mL IVPB        1 g 200 mL/hr over 30 Minutes Intravenous  Once 10/13/20 1316 10/13/20 1524   10/13/20 1330  doxycycline (VIBRA-TABS) tablet 100 mg        100 mg Oral  Once 10/13/20 1316 10/13/20 1418       Time Spent in minutes  30   Susa RaringPrashant Louann Hopson M.D on 10/15/2020 at 11:17 AM  To page go to [email or web address]   [facility] -  Office  [phone]   See all Orders from today for further details    Objective:   Vitals:   10/14/20 1658 10/14/20 2009 10/15/20 0325 10/15/20 0922  BP: (!) 104/53 138/62 Marland Kitchen(!)  115/57   Pulse: 75 78 (!) 57   Resp: (!) 33  20 18  Temp: 98.5 F (36.9 C) 98 F (36.7 C) 99.3 F (37.4 C)   TempSrc: Oral Oral Oral   SpO2: 97% 96% 98% 97%  Weight:      Height:        Wt Readings from Last 3 Encounters:  10/13/20 86.2 kg  01/26/20 86.5 kg  01/10/20 88.4 kg    No intake or output data in the 24 hours ending 10/15/20 1117   Physical Exam  Awake Alert, No new F.N deficits, Normal affect [place].AT,PERRAL Supple Neck,No JVD, No cervical lymphadenopathy appriciated.  Symmetrical Chest wall movement, Good air movement bilaterally, few rales RRR,No Gallops,Rubs or new Murmurs, No Parasternal Heave +ve B.Sounds, Abd Soft, No tenderness, No organomegaly appriciated, No rebound - guarding or rigidity. No Cyanosis, Clubbing or edema, No new Rash or bruise       Data Review:    CBC Recent  Labs  Lab 10/13/20 1206 10/15/20 0819  WBC 13.1* 6.0  HGB 16.0 14.0  HCT 46.7 40.7  PLT 237 209  MCV 91.9 90.2  MCH 31.5 31.0  MCHC 34.3 34.4  RDW 12.2 12.0  LYMPHSABS 1.4  --   MONOABS 1.0  --   EOSABS 0.0  --   BASOSABS 0.1  --     Recent Labs  Lab 10/13/20 1206 10/13/20 1223 10/13/20 1338 10/13/20 2015 10/14/20 1435 10/15/20 0211 10/15/20 0819  NA 131*  --   --   --   --  132*  --   K 4.1  --   --   --   --  4.2  --   CL 96*  --   --   --   --  100  --   CO2 21*  --   --   --   --  24  --   GLUCOSE 201*  --   --   --   --  160*  --   BUN 13  --   --   --   --  12  --   CREATININE 1.12  --   --   --   --  0.88  --   CALCIUM 9.2  --   --   --   --  8.6*  --   AST 14*  --   --   --   --   --   --   ALT 13  --   --   --   --   --   --   ALKPHOS 61  --   --   --   --   --   --   BILITOT 2.1*  --   --   --   --   --   --   ALBUMIN 3.5  --   --   --   --   --   --   MG 1.8  --   --   --  1.8  --  1.8  CRP  --   --   --   --   --   --  17.4*  LATICACIDVEN  --   --  2.1* 2.2*  --   --   --   TSH  --   --   --  3.080 2.742  --  2.336  BNP  --  174.3*  --   --  184.0*  --  253.4*    ------------------------------------------------------------------------------------------------------------------ No results for input(s): CHOL, HDL, LDLCALC, TRIG, CHOLHDL, LDLDIRECT in the last 72 hours.  No results found for: HGBA1C ------------------------------------------------------------------------------------------------------------------ Recent Labs    10/15/20 0819  TSH 2.336    Cardiac Enzymes No results for input(s): CKMB, TROPONINI, MYOGLOBIN in the last 168 hours.  Invalid input(s): CK ------------------------------------------------------------------------------------------------------------------    Component Value Date/Time   BNP 253.4 (H) 10/15/2020 6578    Micro Results Recent Results (from the past 240 hour(s))  Resp Panel by RT-PCR (Flu A&B, Covid)  Nasopharyngeal Swab     Status: None   Collection Time: 10/13/20 12:38 PM   Specimen: Nasopharyngeal Swab; Nasopharyngeal(NP) swabs in vial transport medium  Result Value Ref Range Status   SARS Coronavirus 2 by RT PCR NEGATIVE NEGATIVE Final    Comment: (NOTE) SARS-CoV-2 target nucleic acids are NOT DETECTED.  The SARS-CoV-2 RNA is generally detectable in upper respiratory specimens during the acute phase of infection. The lowest concentration of SARS-CoV-2 viral copies this assay can detect is 138 copies/mL. A negative result does not preclude SARS-Cov-2 infection and should not be used as the sole basis for treatment or other patient management decisions. A negative result may occur with  improper specimen collection/handling, submission of specimen other than nasopharyngeal swab, presence of viral mutation(s) within the areas targeted by this assay, and inadequate number of viral copies(<138 copies/mL). A negative result must be combined with clinical observations, patient history, and epidemiological information. The expected result is Negative.  Fact Sheet for Patients:  BloggerCourse.com  Fact Sheet for Healthcare Providers:  SeriousBroker.it  This test is no t yet approved or cleared by the [place] FDA and  has been authorized for detection and/or diagnosis of SARS-CoV-2 by FDA under an Emergency Use Authorization (EUA). This EUA will remain  in effect (meaning this test can be used) for the duration of the COVID-19 declaration under Section 564(b)(1) of the Act, 21 U.S.C.section 360bbb-3(b)(1), unless the authorization is terminated  or revoked sooner.       Influenza A by PCR NEGATIVE NEGATIVE Final   Influenza B by PCR NEGATIVE NEGATIVE Final    Comment: (NOTE) The Xpert Xpress SARS-CoV-2/FLU/RSV plus assay is intended as an aid in the diagnosis of influenza from Nasopharyngeal swab specimens and should not be  used as a sole basis for treatment. Nasal washings and aspirates are unacceptable for Xpert Xpress SARS-CoV-2/FLU/RSV testing.  Fact Sheet for Patients: BloggerCourse.com  Fact Sheet for Healthcare Providers: SeriousBroker.it  This test is not yet approved or cleared by the [place] FDA and has been authorized for detection and/or diagnosis of SARS-CoV-2 by FDA under an Emergency Use Authorization (EUA). This EUA will remain in effect (meaning this test can be used) for the duration of the COVID-19 declaration under Section 564(b)(1) of the Act, 21 U.S.C. section 360bbb-3(b)(1), unless the authorization is terminated or revoked.  Performed at [facility], [address], [place], [place] [address]   Culture, blood (routine x 2)     Status: None (Preliminary result)   Collection Time: 10/13/20  1:38 PM   Specimen: BLOOD  Result Value Ref Range Status   Specimen Description BLOOD RIGHT ANTECUBITAL  Final   Special Requests   Final    BOTTLES DRAWN AEROBIC AND ANAEROBIC Blood Culture adequate volume   Culture   Final    NO GROWTH 1 DAY Performed at [facility], [address], [place], [place] [address]    Report  Status PENDING  Incomplete  Culture, blood (Routine X 2) w Reflex to ID Panel     Status: None (Preliminary result)   Collection Time: 10/13/20  8:18 PM   Specimen: BLOOD  Result Value Ref Range Status   Specimen Description BLOOD LEFT WRIST  Final   Special Requests   Final    BOTTLES DRAWN AEROBIC AND ANAEROBIC Blood Culture adequate volume   Culture   Final    NO GROWTH < 24 HOURS Performed at [facility], [address], [place], [place] [address]    Report Status PENDING  Incomplete    Radiology Reports CT CHEST WO CONTRAST  Result Date: 10/13/2020 CLINICAL DATA:  Shortness of breath, pneumonia, history of pneumonectomy EXAM: CT CHEST WITHOUT CONTRAST TECHNIQUE: Multidetector CT  imaging of the chest was performed following the standard protocol without IV contrast. COMPARISON:  09/17/2017 FINDINGS: Cardiovascular: Aortic atherosclerosis. Normal heart size. Left coronary artery calcifications. No pericardial effusion. Enlargement of the main pulmonary artery measuring up to 3.7 cm in caliber. Mediastinum/Nodes: Leftward shift of the mediastinal contents. No enlarged mediastinal, hilar, or axillary lymph nodes. Thyroid gland, trachea, and esophagus demonstrate no significant findings. Lungs/Pleura: Redemonstrated postoperative findings of left lower lobectomy. There is acutely superimposed heterogeneous and ground-glass airspace disease throughout the left upper lobe and scattered in the dependent right lower lobe (series 3, image 44, 97). No pleural effusion or pneumothorax. Upper Abdomen: No acute abnormality.  Hepatic steatosis. Musculoskeletal: No chest wall mass or suspicious bone lesions identified. Disc degenerative disease and ankylosis of the thoracic spine. IMPRESSION: 1. Redemonstrated postoperative findings of left lower lobectomy. 2. There is acutely superimposed heterogeneous and ground-glass airspace disease throughout the left upper lobe and scattered in the dependent right lower lobe. Findings are consistent with multifocal infection. 3. Enlargement of the main pulmonary artery measuring up to 3.7 cm in caliber, as can be seen in pulmonary hypertension. 4. Coronary artery disease. 5. Hepatic steatosis. Aortic Atherosclerosis (ICD10-I70.0). Electronically Signed   By: Lauralyn Primes M.D.   On: 10/13/2020 15:53   DG Chest Port 1 View  Result Date: 10/15/2020 CLINICAL DATA:  75 year old male with shortness of breath and tachycardia. History of left lower lobectomy. EXAM: PORTABLE CHEST 1 VIEW COMPARISON:  Chest CT 10/13/2020 and earlier. FINDINGS: Worsening volume loss and opacity in the left hemithorax since 10/13/2020. 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  No definite pleural effusion. No pneumothorax. Stable visible mediastinal contours. Right lung is hyperinflated and negative. No acute osseous abnormality identified. IMPRESSION: 1. Worsening volume loss and opacification of the left hemithorax since 10/13/2020 compatible with progressive pneumonia superimposed on previous lower lobectomy. Mucous plugging felt less likely. 2. Hyperinflated and clear right lung. Electronically Signed   By: Odessa Fleming M.D.   On: 10/15/2020 07:36   DG Chest Port 1 View  Result Date: 10/13/2020 CLINICAL DATA:  Shortness of breath for several weeks. EXAM: PORTABLE CHEST 1 VIEW COMPARISON:  August 29, 2020. FINDINGS: Chronic opacity at the left lung base with elevated left hemidiaphragm, volume loss, and left apical scarring. New superimposed airspace opacities in the left lung. Clear right lung. Similar shift of the mediastinum and heart to the left. Cardiomediastinal silhouette is partially obscured with visual portion similar to prior. Calcific atherosclerosis of the aorta. IMPRESSION: 1. New airspace opacities in the left lung, concerning for pneumonia. Recommend imaging follow-up to resolution. 2. Similar additional chronic changes, as detailed above. Electronically Signed   By: Feliberto Harts MD  On: 10/13/2020 12:52

## 2020-10-15 NOTE — Progress Notes (Signed)
SATURATION QUALIFICATIONS: (This note is used to comply with regulatory documentation for home oxygen)  Patient Saturations on Room Air at Rest = 97%  Patient Saturations on Room Air while Ambulating = 95%  Pt walked in hall for 10 min before getting tired. Tolerated very well.

## 2020-10-16 LAB — CBC WITH DIFFERENTIAL/PLATELET
Abs Immature Granulocytes: 0.05 10*3/uL (ref 0.00–0.07)
Basophils Absolute: 0.1 10*3/uL (ref 0.0–0.1)
Basophils Relative: 1 %
Eosinophils Absolute: 0.1 10*3/uL (ref 0.0–0.5)
Eosinophils Relative: 2 %
HCT: 38.9 % — ABNORMAL LOW (ref 39.0–52.0)
Hemoglobin: 13.7 g/dL (ref 13.0–17.0)
Immature Granulocytes: 1 %
Lymphocytes Relative: 29 %
Lymphs Abs: 1.9 10*3/uL (ref 0.7–4.0)
MCH: 31.1 pg (ref 26.0–34.0)
MCHC: 35.2 g/dL (ref 30.0–36.0)
MCV: 88.4 fL (ref 80.0–100.0)
Monocytes Absolute: 0.8 10*3/uL (ref 0.1–1.0)
Monocytes Relative: 12 %
Neutro Abs: 3.7 10*3/uL (ref 1.7–7.7)
Neutrophils Relative %: 55 %
Platelets: 224 10*3/uL (ref 150–400)
RBC: 4.4 MIL/uL (ref 4.22–5.81)
RDW: 11.9 % (ref 11.5–15.5)
WBC: 6.5 10*3/uL (ref 4.0–10.5)
nRBC: 0 % (ref 0.0–0.2)

## 2020-10-16 LAB — COMPREHENSIVE METABOLIC PANEL
ALT: 44 U/L (ref 0–44)
AST: 38 U/L (ref 15–41)
Albumin: 2.6 g/dL — ABNORMAL LOW (ref 3.5–5.0)
Alkaline Phosphatase: 56 U/L (ref 38–126)
Anion gap: 9 (ref 5–15)
BUN: 11 mg/dL (ref 8–23)
CO2: 25 mmol/L (ref 22–32)
Calcium: 8.6 mg/dL — ABNORMAL LOW (ref 8.9–10.3)
Chloride: 99 mmol/L (ref 98–111)
Creatinine, Ser: 0.85 mg/dL (ref 0.61–1.24)
GFR, Estimated: >60 mL/min (ref >60)
Glucose, Bld: 172 mg/dL — ABNORMAL HIGH (ref 70–99)
Potassium: 3.8 mmol/L (ref 3.5–5.1)
Sodium: 133 mmol/L — ABNORMAL LOW (ref 135–145)
Total Bilirubin: 0.9 mg/dL (ref 0.3–1.2)
Total Protein: 6 g/dL — ABNORMAL LOW (ref 6.5–8.1)

## 2020-10-16 LAB — HEMOGLOBIN A1C
Hgb A1c MFr Bld: 8.1 % — ABNORMAL HIGH (ref 4.8–5.6)
Hgb A1c MFr Bld: 8.3 % — ABNORMAL HIGH (ref 4.8–5.6)
Mean Plasma Glucose: 186 mg/dL
Mean Plasma Glucose: 192 mg/dL

## 2020-10-16 LAB — GLUCOSE, CAPILLARY: Glucose-Capillary: 222 mg/dL — ABNORMAL HIGH (ref 70–99)

## 2020-10-16 LAB — MAGNESIUM: Magnesium: 1.6 mg/dL — ABNORMAL LOW (ref 1.7–2.4)

## 2020-10-16 LAB — C-REACTIVE PROTEIN: CRP: 11.6 mg/dL — ABNORMAL HIGH (ref <1.0)

## 2020-10-16 LAB — PROCALCITONIN: Procalcitonin: <0.1 ng/mL

## 2020-10-16 LAB — BRAIN NATRIURETIC PEPTIDE: B Natriuretic Peptide: 163.9 pg/mL — ABNORMAL HIGH (ref 0.0–100.0)

## 2020-10-16 MED ORDER — DOXYCYCLINE HYCLATE 100 MG PO TABS: 100.0000 mg | ORAL_TABLET | Freq: Two times a day (BID) | ORAL | 0 refills | Status: DC

## 2020-10-16 MED ORDER — CEPHALEXIN 500 MG PO CAPS: 500.0000 mg | ORAL_CAPSULE | Freq: Three times a day (TID) | ORAL | 0 refills | Status: AC

## 2020-10-16 MED ORDER — METOPROLOL TARTRATE 25 MG PO TABS
25.0000 mg | ORAL_TABLET | Freq: Two times a day (BID) | ORAL | Status: DC
  Administered 2020-10-16: 25 mg via ORAL

## 2020-10-16 MED ORDER — MAGNESIUM SULFATE 2 GM/50ML IV SOLN
2.0000 g | Freq: Once | INTRAVENOUS | Status: AC
  Administered 2020-10-16: 2 g via INTRAVENOUS
  Filled 2020-10-16: qty 50

## 2020-10-16 NOTE — Discharge Instructions (Signed)
Follow with Primary MD Dema Severin, NP in 7 days   Get CBC, CMP, 2 view Chest X ray -  checked next visit within 1 week by Primary MD, also please follow-up with your cardiologist and pulmonologist within 7 to 10 days of discharge.  Activity: As tolerated with Full fall precautions use walker/cane & assistance as needed  Disposition Home    Diet: Heart Healthy Low Carb   Special Instructions: If you have smoked or chewed Tobacco  in the last 2 yrs please stop smoking, stop any regular Alcohol  and or any Recreational drug use.  On your next visit with your primary care physician please Get Medicines reviewed and adjusted.  Please request your Prim.MD to go over all Hospital Tests and Procedure/Radiological results at the follow up, please get all Hospital records sent to your Prim MD by signing hospital release before you go home.  If you experience worsening of your admission symptoms, develop shortness of breath, life threatening emergency, suicidal or homicidal thoughts you must seek medical attention immediately by calling 911 or calling your MD immediately  if symptoms less severe.  You Must read complete instructions/literature along with all the possible adverse reactions/side effects for all the Medicines you take and that have been prescribed to you. Take any new Medicines after you have completely understood and accpet all the possible adverse reactions/side effects.

## 2020-10-16 NOTE — Evaluation (Signed)
Occupational Therapy Evaluation Patient Details Name: Herbert Evans MRN: [id number] DOB: 22-Jan-1946 Today's Date: 10/16/2020    History of Present Illness Dayan Desa was admitted on 10/13/20 for cough and fever.  In the ED his HR 168, suspect SVT on EKG, L lung PNA on x-ray.  Pt with significant PMH of DM2, RBBB, Paroxysmal ventricular tachycardia, HTN, DOE, L lower lobectomy, sigmoid colon resection, mild dementia, CAD.   Clinical Impression   PTA patient was living with his spouse in a private residence and was grossly I with ADLs/IADLs without AD. Patient currently functioning at baseline demonstrating observed ADLs including LB dressing and grooming standing at sink level with Mod I to I. Patient self-reports function is at baseline but endorses continued mild cough. Patient does not require continued acute occupational therapy services at this time with OT to sign off.     Follow Up Recommendations  No OT follow up    Equipment Recommendations  None recommended by OT    Recommendations for Other Services       Precautions / Restrictions Precautions Precautions: Fall      Mobility Bed Mobility Overal bed mobility: Modified Independent                  Transfers Overall transfer level: Modified independent                    Balance Overall balance assessment: Mild deficits observed, not formally tested                                         ADL either performed or assessed with clinical judgement   ADL Overall ADL's : Modified independent                                             Vision   Vision Assessment?: No apparent visual deficits     Perception     Praxis      Pertinent Vitals/Pain Pain Assessment: No/denies pain     Hand Dominance Right   Extremity/Trunk Assessment Upper Extremity Assessment Upper Extremity Assessment: Overall WFL for tasks assessed   Lower Extremity Assessment Lower  Extremity Assessment: Overall WFL for tasks assessed   Cervical / Trunk Assessment Cervical / Trunk Assessment: Normal   Communication Communication Communication: No difficulties   Cognition Arousal/Alertness: Awake/alert Behavior During Therapy: WFL for tasks assessed/performed Overall Cognitive Status: Within Functional Limits for tasks assessed                                     General Comments  Patient self-reports function is at baseline. VSS on RA.    Exercises     Shoulder Instructions      Home Living Family/patient expects to be discharged to:: Private residence Living Arrangements: Spouse/significant other Available Help at Discharge: Family;Available 24 hours/day;Other (Comment) (wife can only provide supervision, granddaughter to stay with them this summer to help.) Type of Home: House Home Access: Level entry     Home Layout: One level     Bathroom Shower/Tub: Tub/shower unit;Walk-in shower   Bathroom Toilet: Standard  Prior Functioning/Environment Level of Independence: Independent        Comments: walks without assistive device, still drives, retired Financial planner. Cares for his wife.        OT Problem List:        OT Treatment/Interventions:      OT Goals(Current goals can be found in the care plan section) Acute Rehab OT Goals Patient Stated Goal: To return home today. OT Goal Formulation: With patient  OT Frequency:     Barriers to D/C:            Co-evaluation              AM-PAC OT "6 Clicks" Daily Activity     Outcome Measure Help from another person eating meals?: None Help from another person taking care of personal grooming?: None Help from another person toileting, which includes using toliet, bedpan, or urinal?: None Help from another person bathing (including washing, rinsing, drying)?: None Help from another person to put on and taking off regular upper body clothing?:  None Help from another person to put on and taking off regular lower body clothing?: None 6 Click Score: 24   End of Session Equipment Utilized During Treatment: Gait belt  Activity Tolerance: Patient tolerated treatment well Patient left:    OT Visit Diagnosis: Muscle weakness (generalized) (M62.81)                Time: 4098-1191 OT Time Calculation (min): 12 min Charges:  OT General Charges $OT Visit: 1 Visit OT Evaluation $OT Eval Low Complexity: 1 Low  Oprah Camarena H. OTR/L Supplemental OT, Department of rehab services [phone]  Allice Garro R H. 10/16/2020, 10:01 AM

## 2020-10-16 NOTE — Plan of Care (Signed)

## 2020-10-16 NOTE — Care Management Important Message (Signed)
Important Message  Patient Details  Name: BREVYN RING MRN: [id number] Date of Birth: 12-31-1945   Medicare Important Message Given:  Yes  Patient left prior to IM delivery will mail to the patient home address.   Journiee Feldkamp 10/16/2020, 3:15 PM

## 2020-10-16 NOTE — Discharge Summary (Signed)
Herbert Evans WUJ:[id number] DOB: [birth year] DOA: 10/13/2020  PCP: Dema Severin, NP  Admit date: 10/13/2020  Discharge date: 10/16/2020  Admitted From: Home   Disposition:  Home   Recommendations for Outpatient Follow-up:   Follow up with PCP in 1-2 weeks  PCP Please obtain BMP/CBC, 2 view CXR in 1week,  (see Discharge instructions)   PCP Please follow up on the following pending results:    Home Health: None   Equipment/Devices: None  Consultations: None  Discharge Condition: Stable    CODE STATUS: Full    Diet Recommendation: Heart Healthy Low Carb  Diet Order            Diet Carb Modified Fluid consistency: Thin; Room service appropriate? Yes  Diet effective now                  Chief Complaint  Patient presents with  . Palpitations     Brief history of present illness from the day of admission and additional interim summary    75 y.o.malewith medical history significant ofs/p left lunglobectomy stimulator for severe pneumonia,HTN, IIDM,HLD,, CAD status post partial colectomy, mild dementia, history of paroxysmal ventricular tachycardia on beta-blocker, presented with persistent coughfor few weeks, new onset of fever and shortness of breath along with fast heartbeat, he was diagnosed with multifocal pneumonia along with SVT and admitted to the hospital.                                                                 Hospital Course       1. Acute Hypoxic Resp. Failure due to multifocal pneumonia - he does have a productive cough and was been placed on IV Rocephin and doxycycline, clinically improving, also BNP slightly elevated hence a dose of Lasix will be provided on 10/15/2020.  Echocardiogram essentially stable with mild chronic diastolic dysfunction and EF 60%, he might have had a mild  acute on chronic diastolic CHF as well, with antibiotic and 1 dose of Lasix patient is symptom-free on room air ambulating in the hallway without any distress, will get 3 more days of oral antibiotics and will be discharged home with close outpatient follow-up with PCP, primary pulmonologist and his primary cardiologist within 7 to 10 days of discharge..   Recent Labs  Lab 10/13/20 1206 10/13/20 1223 10/13/20 1238 10/13/20 1338 10/13/20 2015 10/14/20 1435 10/15/20 0819 10/15/20 1119 10/16/20 0330  WBC 13.1*  --   --   --   --   --  6.0  --  6.5  CRP  --   --   --   --   --   --  17.4*  --  11.6*  BNP  --  174.3*  --   --   --  184.0* 253.4*  --  163.9*  PROCALCITON  --   --   --   --   --   --   --  <0.10 <0.10  LATICACIDVEN  --   --   --  2.1* 2.2*  --   --   --   --   AST 14*  --   --   --   --   --   --   --  38  ALT 13  --   --   --   --   --   --   --  44  ALKPHOS 61  --   --   --   --   --   --   --  56  BILITOT 2.1*  --   --   --   --   --   --   --  0.9  ALBUMIN 3.5  --   --   --   --   --   --   --  2.6*  SARSCOV2NAA  --   --  NEGATIVE  --   --   --   --   --   --        2.  SVT.  Currently stable on beta-blocker which will be continued, TSH & echocardiogram stable.  Outpatient cardiology follow-up post discharge.  3.  History of chewing tobacco.  Counseled to quit.  4.  Dyslipidemia.  On statin continue.  5.  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   Discharge diagnosis     Active Problems:   Community acquired pneumonia   PNA (pneumonia)    Discharge instructions    Discharge Instructions    Discharge instructions   Complete by: As directed    Follow with Primary MD Dema Severin, NP in 7 days   Get CBC, CMP, 2 view Chest X ray -  checked next visit within 1 week by Primary MD, also please follow-up with your cardiologist and pulmonologist within 7 to 10 days of discharge.  Activity: As tolerated with Full fall precautions use walker/cane & assistance as  needed  Disposition Home    Diet: Heart Healthy Low Carb   Special Instructions: If you have smoked or chewed Tobacco  in the last 2 yrs please stop smoking, stop any regular Alcohol  and or any Recreational drug use.  On your next visit with your primary care physician please Get Medicines reviewed and adjusted.  Please request your Prim.MD to go over all Hospital Tests and Procedure/Radiological results at the follow up, please get all Hospital records sent to your Prim MD by signing hospital release before you go home.  If you experience worsening of your admission symptoms, develop shortness of breath, life threatening emergency, suicidal or homicidal thoughts you must seek medical attention immediately by calling 911 or calling your MD immediately  if symptoms less severe.  You Must read complete instructions/literature along with all the possible adverse reactions/side effects for all the Medicines you take and that have been prescribed to you. Take any new Medicines after you have completely understood and accpet all the possible adverse reactions/side effects.   Increase activity slowly   Complete by: As directed       Discharge Medications   Allergies as of 10/16/2020      Reactions   Atorvastatin Other (See Comments)   myalgias   Rosuvastatin Other (See Comments)   myalgias      Medication List  TAKE these medications   albuterol 0.63 MG/3ML nebulizer solution Commonly known as: ACCUNEB Take 1 ampule by nebulization every 6 (six) hours as needed for wheezing.   aspirin EC 81 MG tablet Take 81 mg by mouth every morning.   Breztri Aerosphere 160-9-4.8 MCG/ACT Aero Generic drug: Budeson-Glycopyrrol-Formoterol Inhale 2 puffs into the lungs daily.   budesonide-formoterol 80-4.5 MCG/ACT inhaler Commonly known as: Symbicort Inhale 2 puffs into the lungs in the morning and at bedtime. With spacer.   cephALEXin 500 MG capsule Commonly known as: KEFLEX Take 1 capsule  (500 mg total) by mouth 3 (three) times daily for 3 days.   doxycycline 100 MG tablet Commonly known as: VIBRA-TABS Take 1 tablet (100 mg total) by mouth every 12 (twelve) hours.   ezetimibe 10 MG tablet Commonly known as: ZETIA Take 1 tablet (10 mg total) by mouth daily.   fluticasone 50 MCG/ACT nasal spray Commonly known as: FLONASE Place 1 spray into both nostrils daily as needed for allergies or rhinitis.   Livalo 2 MG Tabs Generic drug: Pitavastatin Calcium Take 1 tablet (2 mg total) by mouth daily.   memantine 5 MG tablet Commonly known as: NAMENDA Take 5 mg by mouth 2 (two) times daily.   metFORMIN 1000 MG tablet Commonly known as: GLUCOPHAGE Take 1,000 mg by mouth 2 (two) times daily with a meal.   metoprolol succinate 100 MG 24 hr tablet Commonly known as: TOPROL-XL Take 25 mg by mouth daily. Take with or immediately following a meal.   traZODone 100 MG tablet Commonly known as: DESYREL Take 100 mg by mouth at bedtime.        Follow-up Information    Dema Severin, NP. Schedule an appointment as soon as possible for a visit in 1 week(s).   Contact information: [address] [place] [address] [phone]        Tobb, Lavona Mound, DO. Schedule an appointment as soon as possible for a visit in 1 week(s).   Specialty: Cardiology Contact information: [address] [place] [address] [phone]        Leslye Peer, MD. Schedule an appointment as soon as possible for a visit in 1 week(s).   Specialty: Pulmonary Disease Contact information: [address] [place] [address] [phone]               Major procedures and Radiology Reports - PLEASE review detailed and final reports thoroughly  -       CT CHEST WO CONTRAST  Result Date: 10/13/2020 CLINICAL DATA:  Shortness of breath, pneumonia, history of pneumonectomy EXAM: CT CHEST WITHOUT CONTRAST TECHNIQUE: Multidetector CT imaging of the chest was performed following  the standard protocol without IV contrast. COMPARISON:  09/17/2017 FINDINGS: Cardiovascular: Aortic atherosclerosis. Normal heart size. Left coronary artery calcifications. No pericardial effusion. Enlargement of the main pulmonary artery measuring up to 3.7 cm in caliber. Mediastinum/Nodes: Leftward shift of the mediastinal contents. No enlarged mediastinal, hilar, or axillary lymph nodes. Thyroid gland, trachea, and esophagus demonstrate no significant findings. Lungs/Pleura: Redemonstrated postoperative findings of left lower lobectomy. There is acutely superimposed heterogeneous and ground-glass airspace disease throughout the left upper lobe and scattered in the dependent right lower lobe (series 3, image 44, 97). No pleural effusion or pneumothorax. Upper Abdomen: No acute abnormality.  Hepatic steatosis. Musculoskeletal: No chest wall mass or suspicious bone lesions identified. Disc degenerative disease and ankylosis of the thoracic spine. IMPRESSION: 1. Redemonstrated postoperative findings of left lower lobectomy.  2. There is acutely superimposed heterogeneous and ground-glass airspace disease throughout the left upper lobe and scattered in the dependent right lower lobe. Findings are consistent with multifocal infection. 3. Enlargement of the main pulmonary artery measuring up to 3.7 cm in caliber, as can be seen in pulmonary hypertension. 4. Coronary artery disease. 5. Hepatic steatosis. Aortic Atherosclerosis (ICD10-I70.0). Electronically Signed   By: Lauralyn Primes M.D.   On: 10/13/2020 15:53   DG Chest Port 1 View  Result Date: 10/15/2020 CLINICAL DATA:  75 year old male with shortness of breath and tachycardia. History of left lower lobectomy. EXAM: PORTABLE CHEST 1 VIEW COMPARISON:  Chest CT 10/13/2020 and earlier. FINDINGS: Worsening volume loss and opacity in the left hemithorax since 10/13/2020. 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 No definite pleural effusion. No pneumothorax.  Stable visible mediastinal contours. Right lung is hyperinflated and negative. No acute osseous abnormality identified. IMPRESSION: 1. Worsening volume loss and opacification of the left hemithorax since 10/13/2020 compatible with progressive pneumonia superimposed on previous lower lobectomy. Mucous plugging felt less likely. 2. Hyperinflated and clear right lung. Electronically Signed   By: Odessa Fleming M.D.   On: 10/15/2020 07:36   DG Chest Port 1 View  Result Date: 10/13/2020 CLINICAL DATA:  Shortness of breath for several weeks. EXAM: PORTABLE CHEST 1 VIEW COMPARISON:  August 29, 2020. FINDINGS: Chronic opacity at the left lung base with elevated left hemidiaphragm, volume loss, and left apical scarring. New superimposed airspace opacities in the left lung. Clear right lung. Similar shift of the mediastinum and heart to the left. Cardiomediastinal silhouette is partially obscured with visual portion similar to prior. Calcific atherosclerosis of the aorta. IMPRESSION: 1. New airspace opacities in the left lung, concerning for pneumonia. Recommend imaging follow-up to resolution. 2. Similar additional chronic changes, as detailed above. Electronically Signed   By: Feliberto Harts MD   On: 10/13/2020 12:52   ECHOCARDIOGRAM COMPLETE  Result Date: 10/15/2020    ECHOCARDIOGRAM REPORT   Patient Name:   Herbert Evans Date of Exam: 10/15/2020 Medical Rec #:  [id number]      Height:       70.0 in Accession #:    [id number]     Weight:       190.0 lb Date of Birth:  [birth year]      BSA:          2.042 m Patient Age:    75 years       BP:           115/57 mmHg Patient Gender: M              HR:           91 bpm. Exam Location:  Inpatient Procedure: 2D Echo Indications:    acute diastolic CHF  History:        Patient has prior history of Echocardiogram examinations, most                 recent 12/16/2019. CAD, pneumonia.; Risk Factors:Hypertension,                 Dyslipidemia and Diabetes.  Sonographer:    Delcie Roch  Referring Phys: Heide Scales [facility]  Sonographer Comments: Left lung lobectomy and Image acquisition challenging due to respiratory motion. IMPRESSIONS  1. Left ventricular ejection fraction, by estimation, is 55 to 60%. The left ventricle has normal function. The left ventricle has no regional wall motion abnormalities. Left ventricular diastolic parameters are consistent  with Grade I diastolic dysfunction (impaired relaxation).  2. Right ventricular systolic function is normal. The right ventricular size is mildly enlarged. There is mildly elevated pulmonary artery systolic pressure. The estimated right ventricular systolic pressure is 37.3 mmHg.  3. Left atrial size was mildly dilated.  4. The mitral valve is grossly normal. Mild to possibly moderate, eccentric mitral valve regurgitation.  5. The aortic valve is tricuspid. There is mild calcification of the aortic valve. Aortic valve regurgitation is trivial.  6. The inferior vena cava is normal in size with greater than 50% respiratory variability, suggesting right atrial pressure of 3 mmHg. FINDINGS  Left Ventricle: Left ventricular ejection fraction, by estimation, is 55 to 60%. The left ventricle has normal function. The left ventricle has no regional wall motion abnormalities. The left ventricular internal cavity size was normal in size. There is  no left ventricular hypertrophy. Left ventricular diastolic parameters are consistent with Grade I diastolic dysfunction (impaired relaxation). Right Ventricle: The right ventricular size is mildly enlarged. No increase in right ventricular wall thickness. Right ventricular systolic function is normal. There is mildly elevated pulmonary artery systolic pressure. The tricuspid regurgitant velocity is 2.93 m/s, and with an assumed right atrial pressure of 3 mmHg, the estimated right ventricular systolic pressure is 37.3 mmHg. Left Atrium: Left atrial size was mildly dilated. Right Atrium: Right atrial size was  normal in size. Pericardium: There is no evidence of pericardial effusion. Mitral Valve: The mitral valve is grossly normal. Mild mitral valve regurgitation. Tricuspid Valve: The tricuspid valve is grossly normal. Tricuspid valve regurgitation is trivial. Aortic Valve: The aortic valve is tricuspid. There is mild calcification of the aortic valve. There is moderate aortic valve annular calcification. Aortic valve regurgitation is trivial. Pulmonic Valve: The pulmonic valve was grossly normal. Pulmonic valve regurgitation is trivial. Aorta: The aortic root is normal in size and structure. Venous: The inferior vena cava is normal in size with greater than 50% respiratory variability, suggesting right atrial pressure of 3 mmHg. IAS/Shunts: No atrial level shunt detected by color flow Doppler.  LEFT VENTRICLE PLAX 2D LVIDd:         5.30 cm  Diastology LVIDs:         3.90 cm  LV e' medial:    6.64 cm/s LV PW:         0.90 cm  LV E/e' medial:  18.2 LV IVS:        0.90 cm  LV e' lateral:   11.90 cm/s LVOT diam:     2.00 cm  LV E/e' lateral: 10.2 LV SV:         68 LV SV Index:   33 LVOT Area:     3.14 cm  RIGHT VENTRICLE             IVC RV S prime:     14.60 cm/s  IVC diam: 1.80 cm TAPSE (M-mode): 1.7 cm LEFT ATRIUM           Index       RIGHT ATRIUM           Index LA diam:      5.10 cm 2.50 cm/m  RA Area:     14.20 cm LA Vol (A2C): 59.2 ml 28.99 ml/m RA Volume:   39.20 ml  19.19 ml/m LA Vol (A4C): 86.2 ml 42.21 ml/m  AORTIC VALVE LVOT Vmax:   110.00 cm/s LVOT Vmean:  72.000 cm/s LVOT VTI:    0.217 m  AORTA Ao  Root diam: 3.10 cm Ao Asc diam:  3.50 cm MITRAL VALVE                TRICUSPID VALVE MV Area (PHT): 3.99 cm     TR Peak grad:   34.3 mmHg MV Decel Time: 190 msec     TR Vmax:        293.00 cm/s MV E velocity: 121.00 cm/s MV A velocity: 61.20 cm/s   SHUNTS MV E/A ratio:  1.98         Systemic VTI:  0.22 m                             Systemic Diam: 2.00 cm Nona DellSamuel Mcdowell MD Electronically signed by Nona DellSamuel  Mcdowell MD Signature Date/Time: 10/15/2020/4:01:32 PM    Final     Micro Results     Recent Results (from the past 240 hour(s))  Resp Panel by RT-PCR (Flu A&B, Covid) Nasopharyngeal Swab     Status: None   Collection Time: 10/13/20 12:38 PM   Specimen: Nasopharyngeal Swab; Nasopharyngeal(NP) swabs in vial transport medium  Result Value Ref Range Status   SARS Coronavirus 2 by RT PCR NEGATIVE NEGATIVE Final    Comment: (NOTE) SARS-CoV-2 target nucleic acids are NOT DETECTED.  The SARS-CoV-2 RNA is generally detectable in upper respiratory specimens during the acute phase of infection. The lowest concentration of SARS-CoV-2 viral copies this assay can detect is 138 copies/mL. A negative result does not preclude SARS-Cov-2 infection and should not be used as the sole basis for treatment or other patient management decisions. A negative result may occur with  improper specimen collection/handling, submission of specimen other than nasopharyngeal swab, presence of viral mutation(s) within the areas targeted by this assay, and inadequate number of viral copies(<138 copies/mL). A negative result must be combined with clinical observations, patient history, and epidemiological information. The expected result is Negative.  Fact Sheet for Patients:  BloggerCourse.comhttps://www.fda.gov/media/152166/download  Fact Sheet for Healthcare Providers:  SeriousBroker.ithttps://www.fda.gov/media/152162/download  This test is no t yet approved or cleared by the [place] FDA and  has been authorized for detection and/or diagnosis of SARS-CoV-2 by FDA under an Emergency Use Authorization (EUA). This EUA will remain  in effect (meaning this test can be used) for the duration of the COVID-19 declaration under Section 564(b)(1) of the Act, 21 U.S.C.section 360bbb-3(b)(1), unless the authorization is terminated  or revoked sooner.       Influenza A by PCR NEGATIVE NEGATIVE Final   Influenza B by PCR NEGATIVE NEGATIVE Final     Comment: (NOTE) The Xpert Xpress SARS-CoV-2/FLU/RSV plus assay is intended as an aid in the diagnosis of influenza from Nasopharyngeal swab specimens and should not be used as a sole basis for treatment. Nasal washings and aspirates are unacceptable for Xpert Xpress SARS-CoV-2/FLU/RSV testing.  Fact Sheet for Patients: BloggerCourse.comhttps://www.fda.gov/media/152166/download  Fact Sheet for Healthcare Providers: SeriousBroker.ithttps://www.fda.gov/media/152162/download  This test is not yet approved or cleared by the [place] FDA and has been authorized for detection and/or diagnosis of SARS-CoV-2 by FDA under an Emergency Use Authorization (EUA). This EUA will remain in effect (meaning this test can be used) for the duration of the COVID-19 declaration under Section 564(b)(1) of the Act, 21 U.S.C. section 360bbb-3(b)(1), unless the authorization is terminated or revoked.  Performed at [facility], [address] [place], [place] [id number]   Culture, blood (routine x 2)     Status: None (  Preliminary result)   Collection Time: 10/13/20  1:38 PM   Specimen: BLOOD  Result Value Ref Range Status   Specimen Description BLOOD RIGHT ANTECUBITAL  Final   Special Requests   Final    BOTTLES DRAWN AEROBIC AND ANAEROBIC Blood Culture adequate volume   Culture   Final    NO GROWTH 3 DAYS Performed at [facility], [address] [place], [place] [address]    Report Status PENDING  Incomplete  Culture, blood (Routine X 2) w Reflex to ID Panel     Status: None (Preliminary result)   Collection Time: 10/13/20  8:18 PM   Specimen: BLOOD  Result Value Ref Range Status   Specimen Description BLOOD LEFT WRIST  Final   Special Requests   Final    BOTTLES DRAWN AEROBIC AND ANAEROBIC Blood Culture adequate volume   Culture   Final    NO GROWTH 3 DAYS Performed at [facility], [address], [place], [place] [address]    Report Status PENDING  Incomplete    Today   Subjective     Herbert Evans today has no headache,no chest abdominal pain,no new weakness tingling or numbness, feels much better wants to go home today.     Objective   Blood pressure (!) 125/56, pulse 79, temperature 98.5 F (36.9 C), temperature source Oral, resp. rate 19, height 5\' 10"  (1.778 m), weight 86.2 kg, SpO2 95 %.   Intake/Output Summary (Last 24 hours) at 10/16/2020 0859 Last data filed at 10/16/2020 0841 Gross per 24 hour  Intake 240 ml  Output --  Net 240 ml    Exam  Awake Alert, No new F.N deficits, Normal affect [place].AT,PERRAL Supple Neck,No JVD, No cervical lymphadenopathy appriciated.  Symmetrical Chest wall movement, Good air movement bilaterally, CTAB RRR,No Gallops,Rubs or new Murmurs, No Parasternal Heave +ve B.Sounds, Abd Soft, Non tender, No organomegaly appriciated, No rebound -guarding or rigidity. No Cyanosis, Clubbing or edema, No new Rash or bruise   Data Review   CBC w Diff:  Lab Results  Component Value Date   WBC 6.5 10/16/2020   HGB 13.7 10/16/2020   HCT 38.9 (L) 10/16/2020   PLT 224 10/16/2020   LYMPHOPCT 29 10/16/2020   MONOPCT 12 10/16/2020   EOSPCT 2 10/16/2020   BASOPCT 1 10/16/2020    CMP:  Lab Results  Component Value Date   NA 133 (L) 10/16/2020   K 3.8 10/16/2020   CL 99 10/16/2020   CO2 25 10/16/2020   BUN 11 10/16/2020   CREATININE 0.85 10/16/2020   PROT 6.0 (L) 10/16/2020   PROT 7.5 08/30/2019   ALBUMIN 2.6 (L) 10/16/2020   ALBUMIN 4.6 08/30/2019   BILITOT 0.9 10/16/2020   BILITOT 0.4 08/30/2019   ALKPHOS 56 10/16/2020   AST 38 10/16/2020   ALT 44 10/16/2020  .   Total Time in preparing paper work, data evaluation and todays exam - 35 minutes  12/16/2020 M.D on 10/16/2020 at 8:59 AM  [facility]

## 2020-10-16 NOTE — Progress Notes (Signed)
Patient discharged in stable condition via car with wife. Discharged instructions given and explained in detail. Patient verbalized understanding of all paperwork. Patient denies any questions or concerns at this time.

## 2020-10-16 NOTE — Plan of Care (Signed)
  Problem: Education: Goal: Knowledge of General Education information will improve Description: Including pain rating scale, medication(s)/side effects and non-pharmacologic comfort measures 10/16/2020 1030 by Ross Ludwig, LPN Outcome: Adequate for Discharge 10/16/2020 1029 by Ross Ludwig, LPN Outcome: Progressing   Problem: Health Behavior/Discharge Planning: Goal: Ability to manage health-related needs will improve 10/16/2020 1030 by Ross Ludwig, LPN Outcome: Adequate for Discharge 10/16/2020 1029 by Ross Ludwig, LPN Outcome: Progressing   Problem: Clinical Measurements: Goal: Ability to maintain clinical measurements within normal limits will improve 10/16/2020 1030 by Ross Ludwig, LPN Outcome: Adequate for Discharge 10/16/2020 1029 by Ross Ludwig, LPN Outcome: Progressing Goal: Will remain free from infection 10/16/2020 1030 by Ross Ludwig, LPN Outcome: Adequate for Discharge 10/16/2020 1029 by Ross Ludwig, LPN Outcome: Progressing Goal: Diagnostic test results will improve 10/16/2020 1030 by Ross Ludwig, LPN Outcome: Adequate for Discharge 10/16/2020 1029 by Ross Ludwig, LPN Outcome: Progressing Goal: Respiratory complications will improve 10/16/2020 1030 by Ross Ludwig, LPN Outcome: Adequate for Discharge 10/16/2020 1029 by Ross Ludwig, LPN Outcome: Progressing Goal: Cardiovascular complication will be avoided 10/16/2020 1030 by Ross Ludwig, LPN Outcome: Adequate for Discharge 10/16/2020 1029 by Ross Ludwig, LPN Outcome: Progressing   Problem: Activity: Goal: Risk for activity intolerance will decrease 10/16/2020 1030 by Ross Ludwig, LPN Outcome: Adequate for Discharge 10/16/2020 1029 by Ross Ludwig, LPN Outcome: Progressing   Problem: Nutrition: Goal: Adequate nutrition will be maintained 10/16/2020 1030 by Ross Ludwig, LPN Outcome: Adequate for Discharge 10/16/2020 1029 by Ross Ludwig, LPN Outcome:  Progressing   Problem: Elimination: Goal: Will not experience complications related to bowel motility 10/16/2020 1030 by Ross Ludwig, LPN Outcome: Adequate for Discharge 10/16/2020 1029 by Ross Ludwig, LPN Outcome: Progressing Goal: Will not experience complications related to urinary retention 10/16/2020 1030 by Ross Ludwig, LPN Outcome: Adequate for Discharge 10/16/2020 1029 by Ross Ludwig, LPN Outcome: Progressing   Problem: Coping: Goal: Level of anxiety will decrease 10/16/2020 1030 by Ross Ludwig, LPN Outcome: Adequate for Discharge 10/16/2020 1029 by Ross Ludwig, LPN Outcome: Progressing   Problem: Pain Managment: Goal: General experience of comfort will improve 10/16/2020 1030 by Ross Ludwig, LPN Outcome: Adequate for Discharge 10/16/2020 1029 by Ross Ludwig, LPN Outcome: Progressing   Problem: Safety: Goal: Ability to remain free from injury will improve 10/16/2020 1030 by Ross Ludwig, LPN Outcome: Adequate for Discharge 10/16/2020 1029 by Ross Ludwig, LPN Outcome: Progressing   Problem: Skin Integrity: Goal: Risk for impaired skin integrity will decrease 10/16/2020 1030 by Ross Ludwig, LPN Outcome: Adequate for Discharge 10/16/2020 1029 by Ross Ludwig, LPN Outcome: Progressing

## 2020-10-17 LAB — MYCOPLASMA PNEUMONIAE ANTIBODY, IGM: Mycoplasma pneumo IgM: <770 U/mL (ref 0–769)

## 2020-10-18 LAB — CULTURE, BLOOD (ROUTINE X 2)
Culture: NO GROWTH
Culture: NO GROWTH
Special Requests: ADEQUATE
Special Requests: ADEQUATE

## 2020-11-08 DIAGNOSIS — I4510 Unspecified right bundle-branch block: Secondary | ICD-10-CM | POA: Insufficient documentation

## 2020-11-08 DIAGNOSIS — Z87442 Personal history of urinary calculi: Secondary | ICD-10-CM | POA: Insufficient documentation

## 2020-11-08 DIAGNOSIS — Z8619 Personal history of other infectious and parasitic diseases: Secondary | ICD-10-CM | POA: Insufficient documentation

## 2020-11-08 DIAGNOSIS — I4729 Other ventricular tachycardia: Secondary | ICD-10-CM | POA: Insufficient documentation

## 2020-11-08 DIAGNOSIS — Z9889 Other specified postprocedural states: Secondary | ICD-10-CM | POA: Insufficient documentation

## 2020-11-08 DIAGNOSIS — Z972 Presence of dental prosthetic device (complete) (partial): Secondary | ICD-10-CM | POA: Insufficient documentation

## 2020-11-08 DIAGNOSIS — N201 Calculus of ureter: Secondary | ICD-10-CM | POA: Insufficient documentation

## 2020-11-08 DIAGNOSIS — I10 Essential (primary) hypertension: Secondary | ICD-10-CM | POA: Insufficient documentation

## 2020-11-08 DIAGNOSIS — Z85038 Personal history of other malignant neoplasm of large intestine: Secondary | ICD-10-CM | POA: Insufficient documentation

## 2020-11-08 DIAGNOSIS — M1990 Unspecified osteoarthritis, unspecified site: Secondary | ICD-10-CM | POA: Insufficient documentation

## 2020-11-08 DIAGNOSIS — R053 Chronic cough: Secondary | ICD-10-CM | POA: Insufficient documentation

## 2020-11-08 DIAGNOSIS — R0609 Other forms of dyspnea: Secondary | ICD-10-CM | POA: Insufficient documentation

## 2020-11-11 DIAGNOSIS — R079 Chest pain, unspecified: Secondary | ICD-10-CM

## 2020-11-15 ENCOUNTER — Encounter: Payer: Self-pay | Admitting: Cardiology

## 2020-11-15 ENCOUNTER — Ambulatory Visit: Payer: Medicare Other | Admitting: Cardiology

## 2020-11-15 ENCOUNTER — Other Ambulatory Visit: Payer: Self-pay

## 2020-11-15 ENCOUNTER — Ambulatory Visit (INDEPENDENT_AMBULATORY_CARE_PROVIDER_SITE_OTHER): Payer: Medicare Other

## 2020-11-15 VITALS — BP 114/68 | HR 67 | Ht 70.0 in | Wt 187.0 lb

## 2020-11-15 DIAGNOSIS — R002 Palpitations: Secondary | ICD-10-CM

## 2020-11-15 DIAGNOSIS — E118 Type 2 diabetes mellitus with unspecified complications: Secondary | ICD-10-CM

## 2020-11-15 DIAGNOSIS — I10 Essential (primary) hypertension: Secondary | ICD-10-CM

## 2020-11-15 DIAGNOSIS — E785 Hyperlipidemia, unspecified: Secondary | ICD-10-CM

## 2020-11-15 NOTE — Patient Instructions (Addendum)
Medication Instructions:  Your physician recommends that you continue on your current medications as directed. Please refer to the Current Medication list given to you today.  *If you need a refill on your cardiac medications before your next appointment, please call your pharmacy*   Lab Work: None If you have labs (blood work) drawn today and your tests are completely normal, you will receive your results only by: MyChart Message (if you have MyChart) OR A paper copy in the mail If you have any lab test that is abnormal or we need to change your treatment, we will call you to review the results.   Testing/Procedures: A zio monitor was ordered today. It will remain on for 14 days. You will then return monitor and event diary in provided box. It takes 1-2 weeks for report to be downloaded and returned to [place]. We will call you with the results. If monitor falls off or has orange flashing light, please call Zio for further instructions.     Follow-Up: At [facility], you and your health needs are our priority.  As part of our continuing mission to provide you with exceptional heart care, we have created designated Provider Care Teams.  These Care Teams include your primary Cardiologist (physician) and Advanced Practice Providers (APPs -  Physician Assistants and Nurse Practitioners) who all work together to provide you with the care you need, when you need it.  We recommend signing up for the patient portal called "MyChart".  Sign up information is provided on this After Visit Summary.  MyChart is used to connect with patients for Virtual Visits (Telemedicine).  Patients are able to view lab/test results, encounter notes, upcoming appointments, etc.  Non-urgent messages can be sent to your provider as well.   To learn more about what you can do with MyChart, go to [email or web address].    Your next appointment:   6 month(s)  The format for your next appointment:   In  Person     Other Instructions ZIO  WHY IS MY DOCTOR PRESCRIBING ZIO? The Zio system is proven and trusted by physicians to detect and diagnose irregular heart rhythms -- and has been prescribed to hundreds of thousands of patients.  The FDA has cleared the Zio system to monitor for many different kinds of irregular heart rhythms. In a study, physicians were able to reach a diagnosis 90% of the time with the Zio system1.  You can wear the Zio monitor -- a small, discreet, comfortable patch -- during your normal day-to-day activity, including while you sleep, shower, and exercise, while it records every single heartbeat for analysis.  1Barrett, P., et al. Comparison of 24 Hour Holter Monitoring Versus 14 Day Novel Adhesive Patch Electrocardiographic Monitoring. American Journal of Medicine, 2014.  ZIO VS. HOLTER MONITORING The Zio monitor can be comfortably worn for up to 14 days. Holter monitors can be worn for 24 to 48 hours, limiting the time to record any irregular heart rhythms you may have. Zio is able to capture data for the 51% of patients who have their first symptom-triggered arrhythmia after 48 hours.1  LIVE WITHOUT RESTRICTIONS The Zio ambulatory cardiac monitor is a small, unobtrusive, and water-resistant patch--you might even forget you're wearing it. The Zio monitor records and stores every beat of your heart, whether you're sleeping, working out, or showering. Remove 14 days  after putting on.   [email or web address] [email or web address]        FDA-cleared, clinical grade mobile EKG monitor: Lourena Simmonds is the most  clinically-validated mobile EKG used by the world's leading cardiac care medical professionals With Basic service, know instantly if your heart rhythm is normal or if atrial fibrillation is detected, and email the last single EKG recording to yourself or your doctor Premium service, available for purchase through the Kardia app for $9.99 per  month or $99 per year, includes unlimited history and storage of your EKG recordings, a monthly EKG summary report to share with your doctor, along with the ability to track your blood pressure, activity and weight Includes one KardiaMobile phone clip FREE SHIPPING: Standard delivery 1-3 business days. Orders placed by 11:00am PST will ship that afternoon. Otherwise, will ship next business day. All orders ship via [employer] from [place], [place]

## 2020-11-15 NOTE — Progress Notes (Signed)
Cardiology Office Note:    Date:  11/15/2020   ID:  TAJ NEVINS, DOB 1945-12-03, MRN [id number]  PCP:  Dema Severin, NP  Cardiologist:  Thomasene Ripple, DO  Electrophysiologist:  None   Referring MD: Dema Severin, NP   " I am doing well"  History of Present Illness:    TURRELL SEVERT is a 75 y.o. male with a hx of hyperlipidemia statin intolerant history of ventricular tachycardia on beta-blocker, Type 2 Diabetes.   I saw the patient in April 2021 at that time we discussed medication choices given the fact that he was intolerant to statin but could not afford PCSK9 inhibitors.  I started the patient on Pitvastatin and zetia.  At his visit in July 2021 the patient experiences some shortness of breath I ordered an echocardiogram to reassess his LV function as well as review his cardiac catheterization from 2018 which was normal.  We discussed a follow-up for 3 months unfortunately the patient did not follow-up.  In the interim he was admitted to [facility] as well as [facility] where both times he was treated for pneumonia.  At [facility] he tells me he had a nuclear stress test there and this was reported to be normal. He is here today with his wife he notes that he has been experiencing intermittent palpitations.  He describes this as an abrupt onset of fast heartbeat which last for minutes at a time prior to resolution.  Sometimes this makes him short of breath.  He has not had any dizziness or lightheadedness.  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    DOE (dyspnea on exertion)    History of colon cancer    03-01-2005   s/p  sigmoid colectomy w/ negative nodes  (07-27-2019  per pt last colonoscopy 2019 , no recurrence)   History of Helicobacter pylori infection    09/ 2006   History of kidney stones    History of  pneumonectomy    1967  left lower lobe   Hypertension    Mixed hyperlipidemia    Paroxysmal ventricular tachycardia [facility])    cardiologist--- dr Desma Maxim    (07-27-2019  per pt no issues or s&s since 2018)  takes BB   Pneumonia    RBBB (right bundle branch block)    Right ureteral stone    Type 2 diabetes mellitus (HCC)    followed by pcp --- (07-27-2019  checks blood sugar three times per week in am,  fasting sugar-- 125-160)   Wears dentures    upper    Past Surgical History:  Procedure Laterality Date   CARDIAC CATHETERIZATION  02-09-2002   dr Sharyn Lull  @[facility]    ef 50-55% w/ 10-15% LAD   CARDIAC CATHETERIZATION  06-28-2016   dr 06-30-2016 @[facility]    for arrhythmia)--- normal coronaries, mild focal wall motion abnormallity of distal inferapical wall,  severe right innominate / subclavin tortuosity   CATARACT EXTRACTION W/ INTRAOCULAR LENS  IMPLANT, BILATERAL  2018   COLON RESECTION SIGMOID  03-01-2005   @[facility]    CYSTOSCOPY WITH RETROGRADE PYELOGRAM, URETEROSCOPY AND STENT PLACEMENT Right 08/03/2019   Procedure: CYSTOSCOPY WITH RIGHT  RETROGRADE PYELOGRAM, URETEROSCOPY HOLMIUMM LASER AND STENT PLACEMENT;  Surgeon: 03-03-2005, MD;  Location: [facility] ;  Service: Urology;  Laterality: Right;   CYSTOSCOPY/RETROGRADE/URETEROSCOPY/STONE EXTRACTION WITH BASKET  1970s   LUNG REMOVAL, PARTIAL Left 1967   lower lobe for pneumonia per pt   TONSILLECTOMY AND ADENOIDECTOMY  child    Current Medications: Current Meds  Medication Sig   albuterol (ACCUNEB) 0.63 MG/3ML nebulizer solution Take 1 ampule by nebulization every 6 (six) hours as needed for wheezing.   aspirin EC 81 MG tablet Take 81 mg by mouth every morning.   Budeson-Glycopyrrol-Formoterol (BREZTRI AEROSPHERE) 160-9-4.8 MCG/ACT AERO Inhale 2 puffs into the lungs daily.   budesonide-formoterol (SYMBICORT) 80-4.5 MCG/ACT inhaler Inhale 2 puffs into the lungs in the morning and at bedtime. With spacer.   doxycycline (VIBRA-TABS) 100  MG tablet Take 1 tablet (100 mg total) by mouth every 12 (twelve) hours.   ezetimibe (ZETIA) 10 MG tablet Take 1 tablet (10 mg total) by mouth daily.   fluticasone (FLONASE) 50 MCG/ACT nasal spray Place 1 spray into both nostrils daily as needed for allergies or rhinitis.   memantine (NAMENDA) 5 MG tablet Take 5 mg by mouth 2 (two) times daily.   metFORMIN (GLUCOPHAGE) 1000 MG tablet Take 1,000 mg by mouth 2 (two) times daily with a meal.    metoprolol succinate (TOPROL-XL) 100 MG 24 hr tablet Take 25 mg by mouth daily. Take with or immediately following a meal.   Pitavastatin Calcium (LIVALO) 2 MG TABS Take 1 tablet (2 mg total) by mouth daily.   traZODone (DESYREL) 100 MG tablet Take 100 mg by mouth at bedtime.     Allergies:   Atorvastatin and Rosuvastatin   Social History   Socioeconomic History   Marital status: Married    Spouse name: Not on file   Number of children: Not on file   Years of education: Not on file   Highest education level: Not on file  Occupational History   Not on file  Tobacco Use   Smoking status: Former    Pack years: 0.00    Types: Cigars   Smokeless tobacco: Current    Types: Snuff, Chew   Tobacco comments:    01/26/20 dips still last cigar was in 1976  Substance and Sexual Activity   Alcohol use: No   Drug use: Never   Sexual activity: Not on file  Other Topics Concern   Not on file  Social History Narrative   Not on file   Social Determinants of Health   Financial Resource Strain: Not on file  Food Insecurity: Not on file  Transportation Needs: Not on file  Physical Activity: Not on file  Stress: Not on file  Social Connections: Not on file     Family History: The patient's family history includes Asthma in his mother; Heart attack in his mother; Lung cancer in his sister, sister, and sister; Stroke in his father.  ROS:   Review of Systems  Constitution: Negative for decreased appetite, fever and weight gain.  HENT: Negative for  congestion, ear discharge, hoarse voice and sore throat.   Eyes: Negative for discharge, redness, vision loss in right eye and visual halos.  Cardiovascular: Negative for chest pain, dyspnea on exertion, leg swelling, orthopnea and palpitations.  Respiratory: Negative for cough, hemoptysis, shortness of breath and snoring.   Endocrine: Negative for heat intolerance and polyphagia.  Hematologic/Lymphatic: Negative for bleeding problem. Does not bruise/bleed easily.  Skin: Negative for flushing, nail changes, rash and suspicious lesions.  Musculoskeletal: Negative for arthritis, joint pain, muscle cramps, myalgias, neck pain and stiffness.  Gastrointestinal: Negative  for abdominal pain, bowel incontinence, diarrhea and excessive appetite.  Genitourinary: Negative for decreased libido, genital sores and incomplete emptying.  Neurological: Negative for brief paralysis, focal weakness, headaches and loss of balance.  Psychiatric/Behavioral: Negative for altered mental status, depression and suicidal ideas.  Allergic/Immunologic: Negative for HIV exposure and persistent infections.    EKGs/Labs/Other Studies Reviewed:    The following studies were reviewed today:   EKG:  The ekg ordered today demonstrates sinus rhythm, heart rate 67 bpm  Transthoracic echocardiogram October 15, 2020 IMPRESSIONS     1. Left ventricular ejection fraction, by estimation, is 55 to 60%. The  left ventricle has normal function. The left ventricle has no regional  wall motion abnormalities. Left ventricular diastolic parameters are  consistent with Grade I diastolic  dysfunction (impaired relaxation).   2. Right ventricular systolic function is normal. The right ventricular  size is mildly enlarged. There is mildly elevated pulmonary artery  systolic pressure. The estimated right ventricular systolic pressure is  37.3 mmHg.   3. Left atrial size was mildly dilated.   4. The mitral valve is grossly normal. Mild to  possibly moderate,  eccentric mitral valve regurgitation.   5. The aortic valve is tricuspid. There is mild calcification of the  aortic valve. Aortic valve regurgitation is trivial.   6. The inferior vena cava is normal in size with greater than 50%  respiratory variability, suggesting right atrial pressure of 3 mmHg.   FINDINGS   Left Ventricle: Left ventricular ejection fraction, by estimation, is 55 to 60%. The left ventricle has normal function. The left ventricle has no regional wall motion abnormalities. The left ventricular internal cavity size was normal in size. There is   no left ventricular hypertrophy. Left ventricular diastolic parameters are consistent with Grade I diastolic dysfunction (impaired relaxation).   Right Ventricle: The right ventricular size is mildly enlarged. No increase in right ventricular wall thickness. Right ventricular systolic function is normal. There is mildly elevated pulmonary artery systolic pressure. The tricuspid regurgitant  velocity is 2.93 m/s, and with an assumed right atrial pressure of 3 mmHg, the estimated right ventricular systolic pressure is 37.3 mmHg.   Left Atrium: Left atrial size was mildly dilated.   Right Atrium: Right atrial size was normal in size.   Pericardium: There is no evidence of pericardial effusion.   Mitral Valve: The mitral valve is grossly normal. Mild mitral valve  regurgitation.   Tricuspid Valve: The tricuspid valve is grossly normal. Tricuspid valve  regurgitation is trivial.   Aortic Valve: The aortic valve is tricuspid. There is mild calcification  of the aortic valve. There is moderate aortic valve annular calcification.  Aortic valve regurgitation is trivial.   Pulmonic Valve: The pulmonic valve was grossly normal. Pulmonic valve  regurgitation is trivial.   Aorta: The aortic root is normal in size and structure.   Venous: The inferior vena cava is normal in size with greater than 50%  respiratory  variability, suggesting right atrial pressure of 3 mmHg.   IAS/Shunts: No atrial level shunt detected by color flow Doppler.       Recent Labs: 10/15/2020: TSH 2.336 10/16/2020: ALT 44; B Natriuretic Peptide 163.9; BUN 11; Creatinine, Ser 0.85; Hemoglobin 13.7; Magnesium 1.6; Platelets 224; Potassium 3.8; Sodium 133  Recent Lipid Panel    Component Value Date/Time   CHOL 234 (H) 11/25/2019 1343   TRIG 122 11/25/2019 1343   HDL 48 11/25/2019 1343   CHOLHDL 4.9 11/25/2019 1343   LDLCALC  164 (H) 11/25/2019 1343    Physical Exam:    VS:  BP 114/68 (BP Location: Right Arm, Patient Position: Sitting, Cuff Size: Normal)   Pulse 67   Ht 5\' 10"  (1.778 m)   Wt 187 lb (84.8 kg)   SpO2 97%   BMI 26.83 kg/m     Wt Readings from Last 3 Encounters:  11/15/20 187 lb (84.8 kg)  10/13/20 190 lb (86.2 kg)  01/26/20 190 lb 9.6 oz (86.5 kg)     GEN: Well nourished, well developed in no acute distress HEENT: Normal NECK: No JVD; No carotid bruits LYMPHATICS: No lymphadenopathy CARDIAC: S1S2 noted,RRR, no murmurs, rubs, gallops RESPIRATORY:  Clear to auscultation without rales, wheezing or rhonchi  ABDOMEN: Soft, non-tender, non-distended, +bowel sounds, no guarding. EXTREMITIES: No edema, No cyanosis, no clubbing MUSCULOSKELETAL:  No deformity  SKIN: Warm and dry NEUROLOGIC:  Alert and oriented x 3, non-focal PSYCHIATRIC:  Normal affect, good insight  ASSESSMENT:    1. Palpitations   2. Hypertension, unspecified type   3. Hyperlipidemia LDL goal <100   4. Type 2 diabetes mellitus with complication, without long-term current use of insulin (HCC)    PLAN:     I would like to rule out a cardiovascular etiology of this palpitation, therefore at this time I would like to placed a zio patch for 14  days.   Reviewed [facility] his EF appears to be normal EF grade 1 diastolic dysfunction no significant valvular abnormalities.  He  has had a nuclear stress test which was negative during his [facility].  Diabetes-this is being managed by his primary care doctor.  No adjustments for antidiabetic medications were made today.  The patient is in agreement with the above plan. The patient left the office in stable condition.  The patient will follow up in 6 months or sooner if needed   Medication Adjustments/Labs and Tests Ordered: Current medicines are reviewed at length with the patient today.  Concerns regarding medicines are outlined above.  Orders Placed This Encounter  Procedures   LONG TERM MONITOR (3-14 DAYS)   EKG 12-Lead   No orders of the defined types were placed in this encounter.   Patient Instructions  Medication Instructions:  Your physician recommends that you continue on your current medications as directed. Please refer to the Current Medication list given to you today.  *If you need a refill on your cardiac medications before your next appointment, please call your pharmacy*   Lab Work: None If you have labs (blood work) drawn today and your tests are completely normal, you will receive your results only by: MyChart Message (if you have MyChart) OR A paper copy in the mail If you have any lab test that is abnormal or we need to change your treatment, we will call you to review the results.   Testing/Procedures: A zio monitor was ordered today. It will remain on for 14 days. You will then return monitor and event diary in provided box. It takes 1-2 weeks for report to be downloaded and returned to us. We will call you with the results. If monitor falls off or has orange flashing light, please call Zio for further instructions.     Follow-Up: At [facility], you and your health needs are our priority.  As part of our continuing mission to provide you with exceptional heart care, we have created designated Provider Care Teams.  These Care Teams include your primary  Cardiologist (physician) and Advanced Practice Providers (APPs -  Physician Assistants and Nurse Practitioners) who all work together to provide you with the care you need, when you need it.  We recommend signing up for the patient portal called "MyChart".  Sign up information is provided on this After Visit Summary.  MyChart is used to connect with patients for Virtual Visits (Telemedicine).  Patients are able to view lab/test results, encounter notes, upcoming appointments, etc.  Non-urgent messages can be sent to your provider as well.   To learn more about what you can do with MyChart, go to [email or web address].    Your next appointment:   6 month(s)  The format for your next appointment:   In Person     Other Instructions ZIO  WHY IS MY DOCTOR PRESCRIBING ZIO? The Zio system is proven and trusted by physicians to detect and diagnose irregular heart rhythms -- and has been prescribed to hundreds of thousands of patients.  The FDA has cleared the Zio system to monitor for many different kinds of irregular heart rhythms. In a study, physicians were able to reach a diagnosis 90% of the time with the Zio system1.  You can wear the Zio monitor -- a small, discreet, comfortable patch -- during your normal day-to-day activity, including while you sleep, shower, and exercise, while it records every single heartbeat for analysis.  1Barrett, P., et al. Comparison of 24 Hour Holter Monitoring Versus 14 Day Novel Adhesive Patch Electrocardiographic Monitoring. American Journal of Medicine, 2014.  ZIO VS. HOLTER MONITORING The Zio monitor can be comfortably worn for up to 14 days. Holter monitors can be worn for 24 to 48 hours, limiting the time to record any irregular heart rhythms you may have. Zio is able to capture data for the 51% of patients who have their first symptom-triggered arrhythmia after 48 hours.1  LIVE WITHOUT RESTRICTIONS The Zio ambulatory cardiac monitor is a small,  unobtrusive, and water-resistant patch--you might even forget you're wearing it. The Zio monitor records and stores every beat of your heart, whether you're sleeping, working out, or showering. Remove 14 days  after putting on.   KardiaMobile [email or web address]        FDA-cleared, clinical grade mobile EKG monitor: Lourena Simmonds is the most clinically-validated mobile EKG used by the world's leading cardiac care medical professionals With Basic service, know instantly if your heart rhythm is normal or if atrial fibrillation is detected, and email the last single EKG recording to yourself or your doctor Premium service, available for purchase through the Kardia app for $9.99 per month or $99 per year, includes unlimited history and storage of your EKG recordings, a monthly EKG summary report to share with your doctor, along with the ability to track your blood pressure, activity and weight Includes one KardiaMobile phone clip FREE SHIPPING: Standard delivery 1-3 business days. Orders placed by 11:00am PST will ship that afternoon. Otherwise, will ship next business day. All orders ship via [employer] from [place], [place]        Adopting a Healthy Lifestyle.  Know what a healthy weight is for you (roughly BMI <25) and aim to maintain this   Aim for 7+ servings of fruits and vegetables daily   65-80+ fluid ounces of water or unsweet tea for healthy kidneys   Limit to max 1 drink of alcohol per day; avoid smoking/tobacco   Limit animal fats in diet for cholesterol and heart health - choose grass  fed whenever available   Avoid highly processed foods, and foods high in saturated/trans fats   Aim for low stress - take time to unwind and care for your mental health   Aim for 150 min of moderate intensity exercise weekly for heart health, and weights twice weekly for bone health   Aim for 7-9 hours of sleep daily   When it comes to diets, agreement about the  perfect plan isnt easy to find, even among the experts. Experts at the [facility] [employer] developed an idea known as the Healthy Eating Plate. Just imagine a plate divided into logical, healthy portions.   The emphasis is on diet quality:   Load up on vegetables and fruits - one-half of your plate: Aim for color and variety, and remember that potatoes dont count.   Go for whole grains - one-quarter of your plate: Whole wheat, barley, wheat berries, quinoa, oats, brown rice, and foods made with them. If you want pasta, go with whole wheat pasta.   Protein power - one-quarter of your plate: Fish, chicken, beans, and nuts are all healthy, versatile protein sources. Limit red meat.   The diet, however, does go beyond the plate, offering a few other suggestions.   Use healthy plant oils, such as olive, canola, soy, corn, sunflower and peanut. Check the labels, and avoid partially hydrogenated oil, which have unhealthy trans fats.   If youre thirsty, drink water. Coffee and tea are good in moderation, but skip sugary drinks and limit milk and dairy products to one or two daily servings.   The type of carbohydrate in the diet is more important than the amount. Some sources of carbohydrates, such as vegetables, fruits, whole grains, and beans-are healthier than others.   Finally, stay active  Signed, Thomasene Ripple, DO  11/15/2020 9:04 PM    [facility]

## 2020-11-17 ENCOUNTER — Telehealth: Payer: Self-pay

## 2020-11-17 ENCOUNTER — Telehealth: Payer: Self-pay | Admitting: Cardiology

## 2020-11-17 NOTE — Telephone Encounter (Signed)
Melissa from [facility] calling to speak with a nurse. She states she would like to let them know there is a message from their provider Rene Kocher, that they are working on getting the patient's medications including zetia through assistance. Phone: [phone]

## 2020-11-17 NOTE — Telephone Encounter (Signed)
Returned [employer] call. The office is closed, it went to voicemail. Left a message for her to return my call.

## 2020-11-17 NOTE — Telephone Encounter (Signed)
Spoke with Herbert Evans, she said they are helping the patient get his Zetia. She just wanted [place] to be aware.

## 2020-11-20 DIAGNOSIS — R002 Palpitations: Secondary | ICD-10-CM

## 2020-11-24 ENCOUNTER — Other Ambulatory Visit: Payer: Self-pay

## 2020-11-24 ENCOUNTER — Telehealth: Payer: Self-pay

## 2020-11-24 DIAGNOSIS — E785 Hyperlipidemia, unspecified: Secondary | ICD-10-CM

## 2020-11-24 DIAGNOSIS — E782 Mixed hyperlipidemia: Secondary | ICD-10-CM

## 2020-11-24 NOTE — Progress Notes (Signed)
Referral made 

## 2020-11-24 NOTE — Telephone Encounter (Signed)
Called patient to let him know we placed the referral was placed for the lipid clinic. He verbalized understanding.

## 2020-12-04 ENCOUNTER — Ambulatory Visit: Payer: Medicare Other

## 2020-12-05 ENCOUNTER — Telehealth: Payer: Self-pay

## 2020-12-05 DIAGNOSIS — E785 Hyperlipidemia, unspecified: Secondary | ICD-10-CM

## 2020-12-05 NOTE — Telephone Encounter (Signed)
LMOM FOR PT TO COMPLETE LABS PRIOR TO APPT

## 2020-12-07 ENCOUNTER — Other Ambulatory Visit: Payer: Self-pay

## 2020-12-07 ENCOUNTER — Telehealth: Payer: Self-pay | Admitting: Cardiology

## 2020-12-07 ENCOUNTER — Encounter (HOSPITAL_BASED_OUTPATIENT_CLINIC_OR_DEPARTMENT_OTHER): Payer: Self-pay

## 2020-12-07 ENCOUNTER — Emergency Department (HOSPITAL_BASED_OUTPATIENT_CLINIC_OR_DEPARTMENT_OTHER): Payer: Medicare Other

## 2020-12-07 ENCOUNTER — Emergency Department (HOSPITAL_BASED_OUTPATIENT_CLINIC_OR_DEPARTMENT_OTHER)
Admission: EM | Admit: 2020-12-07 | Discharge: 2020-12-07 | Disposition: A | Discharge status: Home / Self Care | Payer: Medicare Other | Attending: Emergency Medicine | Admitting: Emergency Medicine

## 2020-12-07 DIAGNOSIS — Z7984 Long term (current) use of oral hypoglycemic drugs: Secondary | ICD-10-CM | POA: Insufficient documentation

## 2020-12-07 DIAGNOSIS — I10 Essential (primary) hypertension: Secondary | ICD-10-CM | POA: Insufficient documentation

## 2020-12-07 DIAGNOSIS — Z7982 Long term (current) use of aspirin: Secondary | ICD-10-CM | POA: Insufficient documentation

## 2020-12-07 DIAGNOSIS — E119 Type 2 diabetes mellitus without complications: Secondary | ICD-10-CM | POA: Insufficient documentation

## 2020-12-07 DIAGNOSIS — R0602 Shortness of breath: Secondary | ICD-10-CM | POA: Insufficient documentation

## 2020-12-07 DIAGNOSIS — R002 Palpitations: Secondary | ICD-10-CM | POA: Diagnosis not present

## 2020-12-07 DIAGNOSIS — R42 Dizziness and giddiness: Secondary | ICD-10-CM | POA: Diagnosis not present

## 2020-12-07 DIAGNOSIS — Z87891 Personal history of nicotine dependence: Secondary | ICD-10-CM | POA: Insufficient documentation

## 2020-12-07 DIAGNOSIS — Z79899 Other long term (current) drug therapy: Secondary | ICD-10-CM | POA: Diagnosis not present

## 2020-12-07 DIAGNOSIS — Z85038 Personal history of other malignant neoplasm of large intestine: Secondary | ICD-10-CM | POA: Insufficient documentation

## 2020-12-07 LAB — COMPREHENSIVE METABOLIC PANEL
ALT: 23 U/L (ref 0–44)
AST: 21 U/L (ref 15–41)
Albumin: 4 g/dL (ref 3.5–5.0)
Alkaline Phosphatase: 64 U/L (ref 38–126)
Anion gap: 9 (ref 5–15)
BUN: 23 mg/dL (ref 8–23)
CO2: 25 mmol/L (ref 22–32)
Calcium: 9.5 mg/dL (ref 8.9–10.3)
Chloride: 97 mmol/L — ABNORMAL LOW (ref 98–111)
Creatinine, Ser: 0.99 mg/dL (ref 0.61–1.24)
GFR, Estimated: >60 mL/min (ref >60)
Glucose, Bld: 316 mg/dL — ABNORMAL HIGH (ref 70–99)
Potassium: 4.4 mmol/L (ref 3.5–5.1)
Sodium: 131 mmol/L — ABNORMAL LOW (ref 135–145)
Total Bilirubin: 0.5 mg/dL (ref 0.3–1.2)
Total Protein: 7.5 g/dL (ref 6.5–8.1)

## 2020-12-07 LAB — CBC WITH DIFFERENTIAL/PLATELET
Abs Immature Granulocytes: 0.13 10*3/uL — ABNORMAL HIGH (ref 0.00–0.07)
Basophils Absolute: 0.1 10*3/uL (ref 0.0–0.1)
Basophils Relative: 1 %
Eosinophils Absolute: 0.1 10*3/uL (ref 0.0–0.5)
Eosinophils Relative: 1 %
HCT: 47 % (ref 39.0–52.0)
Hemoglobin: 15.9 g/dL (ref 13.0–17.0)
Immature Granulocytes: 1 %
Lymphocytes Relative: 18 %
Lymphs Abs: 2.1 10*3/uL (ref 0.7–4.0)
MCH: 30.2 pg (ref 26.0–34.0)
MCHC: 33.8 g/dL (ref 30.0–36.0)
MCV: 89.2 fL (ref 80.0–100.0)
Monocytes Absolute: 1.1 10*3/uL — ABNORMAL HIGH (ref 0.1–1.0)
Monocytes Relative: 10 %
Neutro Abs: 8.2 10*3/uL — ABNORMAL HIGH (ref 1.7–7.7)
Neutrophils Relative %: 69 %
Platelets: 188 10*3/uL (ref 150–400)
RBC: 5.27 MIL/uL (ref 4.22–5.81)
RDW: 13.1 % (ref 11.5–15.5)
WBC: 11.7 10*3/uL — ABNORMAL HIGH (ref 4.0–10.5)
nRBC: 0 % (ref 0.0–0.2)

## 2020-12-07 LAB — TROPONIN I (HIGH SENSITIVITY): Troponin I (High Sensitivity): 15 ng/L (ref <18)

## 2020-12-07 LAB — BRAIN NATRIURETIC PEPTIDE: B Natriuretic Peptide: 240.1 pg/mL — ABNORMAL HIGH (ref 0.0–100.0)

## 2020-12-07 MED ORDER — INSULIN ASPART 100 UNIT/ML IJ SOLN
3.0000 [IU] | Freq: Once | INTRAMUSCULAR | Status: AC
  Administered 2020-12-07: 3 [IU] via SUBCUTANEOUS

## 2020-12-07 MED ORDER — METOPROLOL SUCCINATE ER 50 MG PO TB24: 50.0000 mg | ORAL_TABLET | Freq: Every day | ORAL | 0 refills | Status: DC

## 2020-12-07 NOTE — Telephone Encounter (Signed)
Received a call from patient's wife she stated husband has been having irregular heart beat all morning.Stated heart rate at present 163 to 83.He is sob and very light headed.Advised he needs to go to [facility] ED to be evaluated.I will make Dr.Tobb aware.

## 2020-12-07 NOTE — ED Triage Notes (Signed)
Pt states he has felt like his heart has been racing since this morning. Pt states this has happened in the past, has been seen at 3 hospitals but not given dx. Pt endorses some new SHOB.

## 2020-12-07 NOTE — ED Provider Notes (Addendum)
[facility] EMERGENCY DEPARTMENT Provider Note   CSN: [id number] Arrival date & time: 12/07/20  1437     History Chief Complaint  Patient presents with   Palpitations    Herbert Evans is a 75 y.o. male with PMHx HTN, HLD, RBBB, Diabetes, paroxysmal ventricular tachycardia, and hx of partial pneumonectomy who presents to the ED today with complaint of intermittent heart palpitations that began this morning.  Patient reports history of same for the past several years intermittently however states it has gotten worse recently.  He states that he has been to 3 different hospitals and no one has given him an answer however he was recently admitted to our facility in early June for community-acquired pneumonia as well as SVT.  It appears that he converted himself while in the ED.  He is on metoprolol daily and they thought that this was sufficient while he is in the hospital.  He subsequently has also seen cardiology for same recently, placed on a ZIO monitor.  He states he took it off at the beginning of this week and has not heard back about his results.  He felt fine yesterday however this morning woke up with similar symptoms.  Called his cardiologist this morning who told him to come to the ED for further evaluation.  Patient states that he was checking his heart rate at home with his pulse oximeter as well as his blood pressure cuff.  He states that it was anywhere from 50s to 170s back down to 80s and then up again to 160s.  He states that when this happens he will feel short of breath as well as dizzy.  Denies any syncope.  He denies any chest pain with same.  No other complaints at this time.  The history is provided by the patient and medical records.      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    DOE (dyspnea on exertion)    History of colon  cancer    03-01-2005   s/p  sigmoid colectomy w/ negative nodes  (07-27-2019  per pt last colonoscopy 2019 , no recurrence)   History of Helicobacter pylori infection    09/ 2006   History of kidney stones    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   Hypertension    Mixed hyperlipidemia    Paroxysmal ventricular tachycardia [facility])    cardiologist--- dr Desma Maxim    (07-27-2019  per pt no issues or s&s since 2018)  takes BB   Pneumonia    RBBB (right bundle branch block)    Right ureteral stone    Type 2 diabetes mellitus (HCC)    followed by pcp --- (07-27-2019  checks blood sugar three times per week in am,  fasting sugar-- 125-160)   Wears dentures    upper    Patient Active Problem List   Diagnosis Date Noted   Palpitations 11/15/2020   Arthritis 11/08/2020   Chronic cough 11/08/2020   DOE (dyspnea on exertion) 11/08/2020   History of colon cancer 11/08/2020   History of Helicobacter pylori infection 11/08/2020   History of kidney stones 11/08/2020   History of pneumonectomy 11/08/2020   Hypertension 11/08/2020   Paroxysmal ventricular tachycardia (HCC) 11/08/2020   RBBB (right bundle branch block)  11/08/2020   Right ureteral stone 11/08/2020   Wears dentures 11/08/2020   Community acquired pneumonia 10/13/2020   PNA (pneumonia) 10/13/2020   Hyperlipidemia LDL goal <100 01/10/2020   Medication management 08/30/2019   Mixed hyperlipidemia 08/30/2019   Type 2 diabetes mellitus with complication, without long-term current use of insulin (HCC) 08/30/2019    Past Surgical History:  Procedure Laterality Date   CARDIAC CATHETERIZATION  02-09-2002   dr Sharyn Lullharwani  @[facility]    ef 50-55% w/ 10-15% LAD   CARDIAC CATHETERIZATION  06-28-2016   dr Beverely Pacecheek @[facility]    for arrhythmia)--- normal coronaries, mild focal wall motion abnormallity of distal inferapical wall,  severe right innominate / subclavin tortuosity   CATARACT EXTRACTION W/ INTRAOCULAR LENS  IMPLANT, BILATERAL  2018    COLON RESECTION SIGMOID  03-01-2005   @[facility]    CYSTOSCOPY WITH RETROGRADE PYELOGRAM, URETEROSCOPY AND STENT PLACEMENT Right 08/03/2019   Procedure: CYSTOSCOPY WITH RIGHT  RETROGRADE PYELOGRAM, URETEROSCOPY HOLMIUMM LASER AND STENT PLACEMENT;  Surgeon: Bjorn PippinWrenn, John, MD;  Location: [facility] [place];  Service: Urology;  Laterality: Right;   CYSTOSCOPY/RETROGRADE/URETEROSCOPY/STONE EXTRACTION WITH BASKET  1970s   LUNG REMOVAL, PARTIAL Left 1967   lower lobe for pneumonia per pt   TONSILLECTOMY AND ADENOIDECTOMY  child       Family History  Problem Relation Age of Onset   Asthma Mother    Heart attack Mother    Stroke Father    Lung cancer Sister    Lung cancer Sister    Lung cancer Sister     Social History   Tobacco Use   Smoking status: Former    Types: Cigars   Smokeless tobacco: Current    Types: Snuff, Chew   Tobacco comments:    01/26/20 dips still last cigar was in 1976  Substance Use Topics   Alcohol use: No   Drug use: Never    Home Medications Prior to Admission medications   Medication Sig Start Date End Date Taking? Authorizing Provider  metoprolol succinate (TOPROL-XL) 50 MG 24 hr tablet Take 1 tablet (50 mg total) by mouth daily. 12/07/20 01/06/21 Yes Shianna Bally, PA-C  albuterol (ACCUNEB) 0.63 MG/3ML nebulizer solution Take 1 ampule by nebulization every 6 (six) hours as needed for wheezing.    [provider]  aspirin EC 81 MG tablet Take 81 mg by mouth every morning.    [provider]  Budeson-Glycopyrrol-Formoterol (BREZTRI AEROSPHERE) 160-9-4.8 MCG/ACT AERO Inhale 2 puffs into the lungs daily.    [provider]  budesonide-formoterol (SYMBICORT) 80-4.5 MCG/ACT inhaler Inhale 2 puffs into the lungs in the morning and at bedtime. With spacer. 01/26/20   Hunsucker, Lesia SagoMatthew R, MD  doxycycline (VIBRA-TABS) 100 MG tablet Take 1 tablet (100 mg total) by mouth every 12 (twelve) hours. 10/16/20   Leroy SeaSingh, Prashant K, MD  ezetimibe  (ZETIA) 10 MG tablet Take 1 tablet (10 mg total) by mouth daily. 01/10/20   Tobb, Kardie, DO  fluticasone (FLONASE) 50 MCG/ACT nasal spray Place 1 spray into both nostrils daily as needed for allergies or rhinitis.    [provider]  memantine (NAMENDA) 5 MG tablet Take 5 mg by mouth 2 (two) times daily. 11/02/19   [provider]  metFORMIN (GLUCOPHAGE) 1000 MG tablet Take 1,000 mg by mouth 2 (two) times daily with a meal.     [provider]  Pitavastatin Calcium (LIVALO) 2 MG TABS Take 1 tablet (2 mg total) by mouth daily. 01/10/20   Tobb, Lavona MoundKardie, DO  traZODone (DESYREL) 100 MG tablet Take 100 mg by mouth at bedtime. 08/02/19   [provider]    Allergies    Atorvastatin and Rosuvastatin  Review of Systems   Review of Systems  Constitutional:  Negative for chills, diaphoresis and fever.  Respiratory:  Positive for shortness of breath. Negative for cough.   Cardiovascular:  Positive for palpitations. Negative for chest pain and leg swelling.  Gastrointestinal:  Negative for nausea and vomiting.  Neurological:  Positive for dizziness and light-headedness. Negative for headaches.  All other systems reviewed and are negative.  Physical Exam Updated Vital Signs BP (!) 124/53   Pulse 64   Temp 98.6 F (37 C) (Oral)   Resp 16   Ht 5\' 10"  (1.778 m)   Wt 84.8 kg   SpO2 95%   BMI 26.83 kg/m   Physical Exam Vitals and nursing note reviewed.  Constitutional:      Appearance: He is not ill-appearing or diaphoretic.  HENT:     Head: Normocephalic and atraumatic.  Eyes:     Conjunctiva/sclera: Conjunctivae normal.  Cardiovascular:     Rate and Rhythm: Normal rate and regular rhythm.     Pulses: Normal pulses.     Heart sounds: Normal heart sounds.  Pulmonary:     Breath sounds: No wheezing, rhonchi or rales.     Comments: Large surgical incision to left posterior back consistent with previous partial pneumonectomy Speaking in full sentences  without difficulty. Satting 97% on RA. Diminished breath sounds on left.  Abdominal:     Palpations: Abdomen is soft.     Tenderness: There is no abdominal tenderness.  Musculoskeletal:     Cervical back: Neck supple.     Right lower leg: No edema.     Left lower leg: No edema.  Skin:    General: Skin is warm and dry.  Neurological:     Mental Status: He is alert.    ED Results / Procedures / Treatments   Labs (all labs ordered are listed, but only abnormal results are displayed) Labs Reviewed  COMPREHENSIVE METABOLIC PANEL - Abnormal; Notable for the following components:      Result Value   Sodium 131 (*)    Chloride 97 (*)    Glucose, Bld 316 (*)    All other components within normal limits  CBC WITH DIFFERENTIAL/PLATELET - Abnormal; Notable for the following components:   WBC 11.7 (*)    Neutro Abs 8.2 (*)    Monocytes Absolute 1.1 (*)    Abs Immature Granulocytes 0.13 (*)    All other components within normal limits  BRAIN NATRIURETIC PEPTIDE - Abnormal; Notable for the following components:   B Natriuretic Peptide 240.1 (*)    All other components within normal limits  URINALYSIS, ROUTINE W REFLEX MICROSCOPIC  CBG MONITORING, ED  TROPONIN I (HIGH SENSITIVITY)    EKG EKG Interpretation  Date/Time:  Thursday December 07 2020 16:08:23 EDT Ventricular Rate:  69 PR Interval:  187 QRS Duration: 117 QT Interval:  384 QTC Calculation: 412 R Axis:   99 Text Interpretation: Sinus rhythm Multiple ventricular premature complexes IRBBB and LPFB Low voltage, precordial leads Consider inferior infarct Baseline wander in lead(s) V3 PVC new since previous Confirmed by 06-19-2006 [id number]) on 12/07/2020 4:11:52 PM  Radiology DG Chest 2 View  Result Date: 12/07/2020 CLINICAL DATA:  Palpitations EXAM: CHEST - 2 VIEW COMPARISON:  11/10/2020 FINDINGS: There is hyperinflation of the lungs compatible with COPD. Postoperative changes  on the left with volume loss. Left lower lobe  atelectasis or infiltrate, similar to prior study. Right lung clear. Heart is normal size. Aortic atherosclerosis. IMPRESSION: Postoperative changes and volume loss on the left. Left lower lobe atelectasis or infiltrate. No significant change since prior study. Electronically Signed   By: Charlett Nose M.D.   On: 12/07/2020 15:31    Procedures Procedures   Medications Ordered in ED Medications  insulin aspart (novoLOG) injection 3 Units (3 Units Subcutaneous Given 12/07/20 1609)    ED Course  I have reviewed the triage vital signs and the nursing notes.  Pertinent labs & imaging results that were available during my care of the patient were reviewed by me and considered in my medical decision making (see chart for details).     MDM Rules/Calculators/A&P                           75 year old male who presents to the ED today with complaint heart palpitations that began earlier this morning with history of same in the past.  Recent admission for community-acquired pneumonia and SVT last month.  On metoprolol daily and has not missed any doses.  Pending Zio monitor results.  On arrival to the ED today patient is afebrile, nontachycardic, nontachypneic.  Resting comfortably in the room and appears to be in no acute distress.  Reports he is not having any heart palpitations at this time.  EKG obtained, normal sinus rhythm with heart rate in the 80s.  On my exam patient has diminished breath sounds on left fields, history of partial pneumonectomy in the past.  States that he will feel short of breath with a heart palpitations began however denies any shortness of breath at rest.  No fevers or chills.  We will plan for work-up for palpitations at this time with EKG, chest x-ray, lab work including troponin and BNP.  If no acute findings patient will need to follow-up with cardiology for same.  Suspect he may be going in and out of SVT.   CBC with mild elevation in WBC at 11,700.  CXR unchanged from  previous CMP with glucose 316, bicarb WNL and no gap. Sodium and chloride slightly decreased at 131 and 97 however similar to previous Troponin 15 BNP 240.1. 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 Received IV lasix while in the hospital last month but was not discharged home with same. CXR without pulmonary vascular congestion at this time.   Orthostatics: BP- Lying: 122/72 Pulse- Lying: 86 BP- Sitting: 124/67 Pulse- Sitting: 90 BP- Standing at 0 minutes: 114/73 Pulse- Standing at 0 minutes: 93 BP- Standing at 3 minutes: 134/67 Pulse- Standing at 3 minutes: 89  Workup overall reassuring at this time. Pt continues to be in NSR throughout ED visit. Will plan to discuss with pt's cardiologist for further recommendations/to see if they have results of Zio monitor.   Unable to reach pt's cardiologist Dr. Servando Salina as she is out of the office currently. Office does not have zio monitor results. Discussed case with cardiologist on call Dr. Anne Fu who recommends increasing his metoprolol from 25 mg to 50 mg daily at this time. Pt updated on plan; he is in agreement. He needs to follow up with his cardiologist next week for further eval. Stable for discharge.   This note was prepared using Dragon voice recognition software and may include unintentional dictation errors due to the  inherent limitations of voice recognition software.   Final Clinical Impression(s) / ED Diagnoses Final diagnoses:  Heart palpitations    Rx / DC Orders ED Discharge Orders          Ordered    metoprolol succinate (TOPROL-XL) 50 MG 24 hr tablet  Daily        12/07/20 1659             Discharge Instructions      We have increased your metoprolol from 25 mg to 50 mg daily. Please pick up new prescription and take as prescribed.   Follow up with Dr. Servando Salina on Monday to see if your Zio monitor results have returned and to discuss your palpitations  Return to the ED IMMEDIATELY for  any new/worsening symptoms          Tanda Rockers, PA-C 12/07/20 1802    Charlynne Pander, MD 12/07/20 [phone]

## 2020-12-07 NOTE — Discharge Instructions (Addendum)
We have increased your metoprolol from 25 mg to 50 mg daily. Please pick up new prescription and take as prescribed.   Follow up with Dr. Servando Salina on Monday to see if your Zio monitor results have returned and to discuss your palpitations  Return to the ED IMMEDIATELY for any new/worsening symptoms

## 2020-12-07 NOTE — Telephone Encounter (Signed)
STAT if HR is under 50 or over 120 (normal HR is 60-100 beats per minute)  What is your heart rate? 56, 166, 172, 162, 172  Do you have a log of your heart rate readings (document readings)? yes  Do you have any other symptoms? Weak, lightheadedness, sob

## 2020-12-07 NOTE — Telephone Encounter (Signed)
I have Margo, PA from [facility] calling in regards to pt being in the ED, wants to know what's the next steps moving forward

## 2020-12-08 NOTE — Telephone Encounter (Signed)
Pt ws seen in the ER

## 2020-12-11 ENCOUNTER — Telehealth: Payer: Self-pay | Admitting: Cardiology

## 2020-12-11 ENCOUNTER — Telehealth: Payer: Self-pay | Admitting: *Deleted

## 2020-12-11 LAB — HEPATIC FUNCTION PANEL
ALT: 27 IU/L (ref 0–44)
AST: 24 IU/L (ref 0–40)
Albumin: 4.6 g/dL (ref 3.7–4.7)
Alkaline Phosphatase: 86 IU/L (ref 44–121)
Bilirubin Total: 0.6 mg/dL (ref 0.0–1.2)
Bilirubin, Direct: 0.2 mg/dL (ref 0.00–0.40)
Total Protein: 8 g/dL (ref 6.0–8.5)

## 2020-12-11 LAB — LIPID PANEL
Chol/HDL Ratio: 3.5 ratio (ref 0.0–5.0)
Cholesterol, Total: 232 mg/dL — ABNORMAL HIGH (ref 100–199)
HDL: 66 mg/dL (ref >39)
LDL Chol Calc (NIH): 146 mg/dL — ABNORMAL HIGH (ref 0–99)
Triglycerides: 116 mg/dL (ref 0–149)
VLDL Cholesterol Cal: 20 mg/dL (ref 5–40)

## 2020-12-11 NOTE — Telephone Encounter (Signed)
Left message on patients voicemail to please return our call.   

## 2020-12-11 NOTE — Telephone Encounter (Signed)
Herbert Evans is returning call.

## 2020-12-11 NOTE — Telephone Encounter (Signed)
Pt c/o medication issue:  1. Name of Medication: metoprolol succinate (TOPROL-XL) 50 MG 24 hr tablet  2. How are you currently taking this medication (dosage and times per day)? Take 1 tablet (50 mg total) by mouth daily.  3. Are you having a reaction (difficulty breathing--STAT)? no  4. What is your medication issue?  States that his heart rate is jumping around and has gotten as low as 38 and is dizzy and off balance    STAT if HR is under 50 or over 120 (normal HR is 60-100 beats per minute)  What is your heart rate? 38  Do you have a log of your heart rate readings (document readings)? 51, 37, 45, 38, 39  Do you have any other symptoms? dizzy and off balance

## 2020-12-11 NOTE — Telephone Encounter (Signed)
Left message for pt to stop Metoprolol 50 mg and make appt to see Dr. Servando Salina in the next 1-2 weeks for follow up. Got a message that patient had called to say HR was 38 but unable to speak to patient at that time, was stocking rooms at end of day.

## 2020-12-12 ENCOUNTER — Other Ambulatory Visit: Payer: Self-pay

## 2020-12-12 MED ORDER — AMIODARONE HCL 200 MG PO TABS: ORAL_TABLET | ORAL | 3 refills | Status: DC

## 2020-12-12 NOTE — Telephone Encounter (Signed)
Touched base with pt today after he called yesterday about HR of 38. He is feeling tired and states his HR is 160 today and has been going up and down frequently. Pt stated he had worn heart monitor and that DR. Tobb was waiting on the results of that to talk to pt about possible treatment. Let pt know that I will reach out to Dr. Servando Salina and have her look at results and get back to him. He voiced understanding.

## 2020-12-12 NOTE — Progress Notes (Signed)
Prescription sent to pharmacy.

## 2020-12-12 NOTE — Telephone Encounter (Signed)
Called patient, he is made aware of Dr. Mallory Shirk recommendations. Prescription sent to pharmacy. Patient added to be seen tomorrow in [facility]

## 2020-12-12 NOTE — Telephone Encounter (Signed)
Called patient and asked what was his last heart rate. Currently it is 58. Heart rate jumped to 167 while on the phone. Dizzy and off balance when this is occurring, and tired. Toprol-XL was increased to 50 mg once daily and it has not helped. He started taking the increased dose on Friday 7/29. Patient wants to know what to do now. He has been seen at several emergency rooms. Will relay message to Dr. Servando Salina and call patient back.

## 2020-12-13 ENCOUNTER — Other Ambulatory Visit: Payer: Self-pay

## 2020-12-13 ENCOUNTER — Ambulatory Visit: Payer: Medicare Other | Admitting: Cardiology

## 2020-12-13 VITALS — BP 106/66 | HR 54 | Ht 70.0 in | Wt 189.1 lb

## 2020-12-13 DIAGNOSIS — I340 Nonrheumatic mitral (valve) insufficiency: Secondary | ICD-10-CM | POA: Diagnosis not present

## 2020-12-13 DIAGNOSIS — I10 Essential (primary) hypertension: Secondary | ICD-10-CM | POA: Insufficient documentation

## 2020-12-13 DIAGNOSIS — I493 Ventricular premature depolarization: Secondary | ICD-10-CM | POA: Diagnosis not present

## 2020-12-13 DIAGNOSIS — I472 Ventricular tachycardia: Secondary | ICD-10-CM | POA: Diagnosis not present

## 2020-12-13 DIAGNOSIS — E782 Mixed hyperlipidemia: Secondary | ICD-10-CM

## 2020-12-13 DIAGNOSIS — I4729 Other ventricular tachycardia: Secondary | ICD-10-CM

## 2020-12-13 MED ORDER — METOPROLOL SUCCINATE ER 25 MG PO TB24: 12.5000 mg | ORAL_TABLET | Freq: Every day | ORAL | 3 refills | Status: DC

## 2020-12-13 NOTE — Progress Notes (Signed)
Cardiology Office Note:    Date:  12/14/2020   ID:  Herbert Evans, DOB 1945-09-24, MRN [id number]  PCP:  Dema Severin, NP  Cardiologist:  Thomasene Ripple, DO  Electrophysiologist:  None   Referring MD: Dema Severin, NP    History of Present Illness:    Herbert Evans is a 75 y.o. male with a hx of  hyperlipidemia statin intolerant history of ventricular tachycardia on beta-blocker, Type 2 Diabetes.   I saw the patient in April 2021 at that time we discussed medication choices given the fact that he was intolerant to statin but could not afford PCSK9 inhibitors.  I started the patient on Pitvastatin and zetia.   I saw the patient on July 2021 the patient experiences some shortness of breath I ordered an echocardiogram to reassess his LV function as well as review his cardiac catheterization from 2018 which was normal.  We discussed a follow-up for 3 months unfortunately the patient did not follow-up.  I saw the patient on November 15, 2020 at that time he reported he had been experiencing some palpitation shortness of breath the office to monitor the patient when he did wear in the interim which showed episodes of NSVT, occasional PVCs as well as paroxysmal atrial tachycardia.  First we increase his beta-blocker but the patient called reporting that he has been still experiencing increasing heart rate.  In the interim I started the patient on amiodarone for 400 mg 7 days a day and then to convert to 200 mg daily.  In addition during his last visit we also reviewed his nuclear stress test which was done at [facility] showing normal nuclear stress test-with no evidence of ischemia.  He is here today and tells me that he has been doing better with his heart rate and palpitations since he started amiodarone.  He does know when we increase his metoprolol to 50 mg daily he felt as if he got rash but at the same time he started a multivitamin.  He has since stopped the multivitamin.   Past Medical  History:  Diagnosis Date   Arthritis    back, hands   Chronic cough    07-27-2019  per pt has had dry chronic cough since 1967 due to partial left lung removal    DOE (dyspnea on exertion)    History of colon cancer    03-01-2005   s/p  sigmoid colectomy w/ negative nodes  (07-27-2019  per pt last colonoscopy 2019 , no recurrence)   History of Helicobacter pylori infection    09/ 2006   History of kidney stones    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   Hypertension    Mixed hyperlipidemia    Paroxysmal ventricular tachycardia [facility])    cardiologist--- dr Desma Maxim    (07-27-2019  per pt no issues or s&s since 2018)  takes BB   Pneumonia    RBBB (right bundle branch block)    Right ureteral stone    Type 2 diabetes mellitus (HCC)    followed by pcp --- (07-27-2019  checks blood sugar three times per week in am,  fasting sugar-- 125-160)   Wears dentures    upper    Past Surgical History:  Procedure Laterality Date   CARDIAC CATHETERIZATION  02-09-2002   dr Sharyn Lull  @[facility]    ef 50-55% w/ 10-15% LAD   CARDIAC CATHETERIZATION  06-28-2016   dr 06-30-2016 @[facility]    for  arrhythmia)--- normal coronaries, mild focal wall motion abnormallity of distal inferapical wall,  severe right innominate / subclavin tortuosity   CATARACT EXTRACTION W/ INTRAOCULAR LENS  IMPLANT, BILATERAL  2018   COLON RESECTION SIGMOID  03-01-2005   @[facility]    CYSTOSCOPY WITH RETROGRADE PYELOGRAM, URETEROSCOPY AND STENT PLACEMENT Right 08/03/2019   Procedure: CYSTOSCOPY WITH RIGHT  RETROGRADE PYELOGRAM, URETEROSCOPY HOLMIUMM LASER AND STENT PLACEMENT;  Surgeon: 08/05/2019, MD;  [facility];  Service: Urology;  Laterality: Right;   CYSTOSCOPY/RETROGRADE/URETEROSCOPY/STONE EXTRACTION WITH BASKET  1970s   LUNG REMOVAL, PARTIAL Left 1967   lower lobe for pneumonia per pt   TONSILLECTOMY AND ADENOIDECTOMY  child    Current Medications: Current Meds  Medication Sig   albuterol (ACCUNEB) 0.63  MG/3ML nebulizer solution Take 1 ampule by nebulization every 6 (six) hours as needed for wheezing.   amiodarone (PACERONE) 200 MG tablet Take 400 mg (two tablets)  twice daily for 7 days then 200 mg (one tablet) once daily.   aspirin EC 81 MG tablet Take 81 mg by mouth every morning.   Budeson-Glycopyrrol-Formoterol (BREZTRI AEROSPHERE) 160-9-4.8 MCG/ACT AERO Inhale 2 puffs into the lungs daily.   budesonide-formoterol (SYMBICORT) 80-4.5 MCG/ACT inhaler Inhale 2 puffs into the lungs in the morning and at bedtime. With spacer.   doxycycline (VIBRA-TABS) 100 MG tablet Take 1 tablet (100 mg total) by mouth every 12 (twelve) hours.   ezetimibe (ZETIA) 10 MG tablet Take 1 tablet (10 mg total) by mouth daily.   fluticasone (FLONASE) 50 MCG/ACT nasal spray Place 1 spray into both nostrils daily as needed for allergies or rhinitis.   memantine (NAMENDA) 5 MG tablet Take 5 mg by mouth 2 (two) times daily.   metFORMIN (GLUCOPHAGE) 1000 MG tablet Take 1,000 mg by mouth 2 (two) times daily with a meal.    metoprolol succinate (TOPROL XL) 25 MG 24 hr tablet Take 0.5 tablets (12.5 mg total) by mouth daily.   Pitavastatin Calcium (LIVALO) 2 MG TABS Take 1 tablet (2 mg total) by mouth daily.   traZODone (DESYREL) 100 MG tablet Take 100 mg by mouth at bedtime.   [DISCONTINUED] metoprolol succinate (TOPROL-XL) 50 MG 24 hr tablet Take 1 tablet (50 mg total) by mouth daily.     Allergies:   Atorvastatin and Rosuvastatin   Social History   Socioeconomic History   Marital status: Married    Spouse name: Not on file   Number of children: Not on file   Years of education: Not on file   Highest education level: Not on file  Occupational History   Not on file  Tobacco Use   Smoking status: Former    Types: Cigars   Smokeless tobacco: Current    Types: Snuff, Chew   Tobacco comments:    01/26/20 dips still last cigar was in 1976  Substance and Sexual Activity   Alcohol use: No   Drug use: Never   Sexual  activity: Not on file  Other Topics Concern   Not on file  Social History Narrative   Not on file   Social Determinants of Health   Financial Resource Strain: Not on file  Food Insecurity: Not on file  Transportation Needs: Not on file  Physical Activity: Not on file  Stress: Not on file  Social Connections: Not on file     Family History: The patient's family history includes Asthma in his mother; Heart attack in his mother; Lung cancer in his sister, sister, and sister; Stroke  in his father.  ROS:   Review of Systems  Constitution: Negative for decreased appetite, fever and weight gain.  HENT: Negative for congestion, ear discharge, hoarse voice and sore throat.   Eyes: Negative for discharge, redness, vision loss in right eye and visual halos.  Cardiovascular: Negative for chest pain, dyspnea on exertion, leg swelling, orthopnea and palpitations.  Respiratory: Negative for cough, hemoptysis, shortness of breath and snoring.   Endocrine: Negative for heat intolerance and polyphagia.  Hematologic/Lymphatic: Negative for bleeding problem. Does not bruise/bleed easily.  Skin: Negative for flushing, nail changes, rash and suspicious lesions.  Musculoskeletal: Negative for arthritis, joint pain, muscle cramps, myalgias, neck pain and stiffness.  Gastrointestinal: Negative for abdominal pain, bowel incontinence, diarrhea and excessive appetite.  Genitourinary: Negative for decreased libido, genital sores and incomplete emptying.  Neurological: Negative for brief paralysis, focal weakness, headaches and loss of balance.  Psychiatric/Behavioral: Negative for altered mental status, depression and suicidal ideas.  Allergic/Immunologic: Negative for HIV exposure and persistent infections.    EKGs/Labs/Other Studies Reviewed:    The following studies were reviewed today:   EKG:  The ekg ordered today demonstrates sinus bradycardia HR 54,   Zio monitor Patch Wear Time:  13 days and  23 hours November 20, 2020 Indication: Palpitations   patient had a min HR of 49 bpm, max HR of 200 bpm, and avg HR of 67 bpm. Predominant underlying rhythm was Sinus Rhythm.   21 Ventricular Tachycardia runs occurred, the run with the fastest interval lasting 5 beats with a max rate of 179 bpm, the longest lasting 6 beats with an avg rate of 145 bpm.   62 Supraventricular Tachycardia runs occurred, the run with the fastest interval lasting 1 min 33 secs with a max rate of 200 bpm, the longest lasting 2 hours 38 mins with an avg rate of 154 bpm.     Premature atrial complexes were rare (<1.0%). Premature ventricular complexes were occasional (4.7%, 55601). Ventricular Bigeminy and Trigeminy were present.   Symptoms associated with this supraventricular tachycardia, and occasional premature ventricular complexes.   Conclusion: This study is remarkable for                                              1.  Several episodes of nonsustained ventricular tachycardia                                             2.  Paroxysmal supraventricular tachycardia which is likely atrial tachycardia with variable block                                             3.  Occasional premature ventricular complex      TTE 10/15/2020 IMPRESSIONS     1. Left ventricular ejection fraction, by estimation, is 55 to 60%. The  left ventricle has normal function. The left ventricle has no regional  wall motion abnormalities. Left ventricular diastolic parameters are  consistent with Grade I diastolic  dysfunction (impaired relaxation).   2. Right ventricular systolic function is normal. The right ventricular  size is mildly enlarged. There is mildly elevated  pulmonary artery  systolic pressure. The estimated right ventricular systolic pressure is  37.3 mmHg.   3. Left atrial size was mildly dilated.   4. The mitral valve is grossly normal. Mild to possibly moderate, eccentric mitral valve regurgitation.   5. The aortic  valve is tricuspid. There is mild calcification of the  aortic valve. Aortic valve regurgitation is trivial.   6. The inferior vena cava is normal in size with greater than 50%  respiratory variability, suggesting right atrial pressure of 3 mmHg.   FINDINGS   Left Ventricle: Left ventricular ejection fraction, by estimation, is 55 to 60%. The left ventricle has normal function. The left ventricle has no regional wall motion abnormalities. The left ventricular internal cavity size was normal in size. There is  no left ventricular hypertrophy. Left ventricular diastolic parameters are consistent with Grade I diastolic dysfunction (impaired relaxation).   Right Ventricle: The right ventricular size is mildly enlarged. No increase in right ventricular wall thickness. Right ventricular systolic function is normal. There is mildly elevated pulmonary artery systolic pressure. The tricuspid regurgitant velocity is 2.93 m/s, and with an assumed right atrial pressure of 3 mmHg, the estimated right ventricular systolic pressure is 37.3 mmHg.   Left Atrium: Left atrial size was mildly dilated.   Right Atrium: Right atrial size was normal in size.   Pericardium: There is no evidence of pericardial effusion.   Mitral Valve: The mitral valve is grossly normal. Mild mitral valve regurgitation.   Tricuspid Valve: The tricuspid valve is grossly normal. Tricuspid valve regurgitation is trivial.   Aortic Valve: The aortic valve is tricuspid. There is mild calcification  of the aortic valve. There is moderate aortic valve annular calcification. Aortic valve regurgitation is trivial.   Pulmonic Valve: The pulmonic valve was grossly normal. Pulmonic valve regurgitation is trivial.   Aorta: The aortic root is normal in size and structure.   Venous: The inferior vena cava is normal in size with greater than 50% respiratory variability, suggesting right atrial pressure of 3 mmHg.   IAS/Shunts: No atrial level  shunt detected by color flow Doppler.      Recent Labs: 10/15/2020: TSH 2.336 10/16/2020: Magnesium 1.6 12/07/2020: B Natriuretic Peptide 240.1; BUN 23; Creatinine, Ser 0.99; Hemoglobin 15.9; Platelets 188; Potassium 4.4; Sodium 131 12/11/2020: ALT 27  Recent Lipid Panel    Component Value Date/Time   CHOL 232 (H) 12/11/2020 0929   TRIG 116 12/11/2020 0929   HDL 66 12/11/2020 0929   CHOLHDL 3.5 12/11/2020 0929   LDLCALC 146 (H) 12/11/2020 0929    Physical Exam:    VS:  BP 106/66 (BP Location: Right Arm, Patient Position: Sitting, Cuff Size: Normal)   Pulse (!) 54   Ht  (1.778 m)   Wt 189 lb 1.3 oz (85.8 kg)   SpO2 97%   BMI 27.13 kg/m     Wt Readings from Last 3 Encounters:  12/13/20 189 lb 1.3 oz (85.8 kg)  12/07/20 187 lb (84.8 kg)  11/15/20 187 lb (84.8 kg)     GEN: Well nourished, well developed in no acute distress HEENT: Normal NECK: No JVD; No carotid bruits LYMPHATICS: No lymphadenopathy CARDIAC: S1S2 noted,RRR, no murmurs, rubs, gallops RESPIRATORY:  Clear to auscultation without rales, wheezing or rhonchi  ABDOMEN: Soft, non-tender, non-distended, +bowel sounds, no guarding. EXTREMITIES: No edema, No cyanosis, no clubbing MUSCULOSKELETAL:  No deformity  SKIN: Warm and dry NEUROLOGIC:  Alert and oriented x 3, non-focal PSYCHIATRIC:  Normal affect, good  insight  ASSESSMENT:    1. Nonrheumatic mitral valve regurgitation   2. NSVT (nonsustained ventricular tachycardia) (HCC)   3. Occasional PVCs   4. Primary hypertension   5. Mixed hyperlipidemia    PLAN:     He has responded favorably to the amiodarone we will keep the patient on it for now. Will call back on his metoprolol to 25 mg daily.  Is have some diffuse rash on his knee which is unclear if it is from the increased dose of metoprolol or from the multivitamin he was using.  He stopped the multivitamin and would then go back on his previous dose of metoprolol.  In the meantime I did offer the  patient the use of over-the-counter hydrocortisone and also advised him that if this does not improve he should call his PCP.  Hyperlipidemia - continue with current statin medication.  The patient is in agreement with the above plan. The patient left the office in stable condition.  The patient will follow up in   Medication Adjustments/Labs and Tests Ordered: Current medicines are reviewed at length with the patient today.  Concerns regarding medicines are outlined above.  Orders Placed This Encounter  Procedures   EKG 12-Lead   Meds ordered this encounter  Medications   metoprolol succinate (TOPROL XL) 25 MG 24 hr tablet    Sig: Take 0.5 tablets (12.5 mg total) by mouth daily.    Dispense:  45 tablet    Refill:  3    Patient Instructions  Medication Instructions:  Your physician has recommended you make the following change in your medication:  DECREASE: Metoprolol succinate 25 mg once daily *If you need a refill on your cardiac medications before your next appointment, please call your pharmacy*   Lab Work: None If you have labs (blood work) drawn today and your tests are completely normal, you will receive your results only by: MyChart Message (if you have MyChart) OR A paper copy in the mail If you have any lab test that is abnormal or we need to change your treatment, we will call you to review the results.   Testing/Procedures: None   Follow-Up: At [facility], you and your health needs are our priority.  As part of our continuing mission to provide you with exceptional heart care, we have created designated Provider Care Teams.  These Care Teams include your primary Cardiologist (physician) and Advanced Practice Providers (APPs -  Physician Assistants and Nurse Practitioners) who all work together to provide you with the care you need, when you need it.  We recommend signing up for the patient portal called "MyChart".  Sign up information is provided on this  After Visit Summary.  MyChart is used to connect with patients for Virtual Visits (Telemedicine).  Patients are able to view lab/test results, encounter notes, upcoming appointments, etc.  Non-urgent messages can be sent to your provider as well.   To learn more about what you can do with MyChart, go to [email or web address].    Your next appointment:   4 month(s)  The format for your next appointment:   In Person  Provider:   [address] - Thomasene RippleKardie Janki Dike, DO    Other Instructions   Adopting a Healthy Lifestyle.  Know what a healthy weight is for you (roughly BMI <25) and aim to maintain this   Aim for 7+ servings of fruits and vegetables daily   65-80+ fluid ounces of water or unsweet tea for healthy kidneys   Limit  to max 1 drink of alcohol per day; avoid smoking/tobacco   Limit animal fats in diet for cholesterol and heart health - choose grass fed whenever available   Avoid highly processed foods, and foods high in saturated/trans fats   Aim for low stress - take time to unwind and care for your mental health   Aim for 150 min of moderate intensity exercise weekly for heart health, and weights twice weekly for bone health   Aim for 7-9 hours of sleep daily   When it comes to diets, agreement about the perfect plan isnt easy to find, even among the experts. Experts at the [facility] of [employer] developed an idea known as the Healthy Eating Plate. Just imagine a plate divided into logical, healthy portions.   The emphasis is on diet quality:   Load up on vegetables and fruits - one-half of your plate: Aim for color and variety, and remember that potatoes dont count.   Go for whole grains - one-quarter of your plate: Whole wheat, barley, wheat berries, quinoa, oats, brown rice, and foods made with them. If you want pasta, go with whole wheat pasta.   Protein power - one-quarter of your plate: Fish, chicken, beans, and nuts are all healthy, versatile protein  sources. Limit red meat.   The diet, however, does go beyond the plate, offering a few other suggestions.   Use healthy plant oils, such as olive, canola, soy, corn, sunflower and peanut. Check the labels, and avoid partially hydrogenated oil, which have unhealthy trans fats.   If youre thirsty, drink water. Coffee and tea are good in moderation, but skip sugary drinks and limit milk and dairy products to one or two daily servings.   The type of carbohydrate in the diet is more important than the amount. Some sources of carbohydrates, such as vegetables, fruits, whole grains, and beans-are healthier than others.   Finally, stay active  Signed, Thomasene Ripple, DO  12/14/2020 8:23 PM    [facility]

## 2020-12-13 NOTE — Patient Instructions (Addendum)
Medication Instructions:  Your physician has recommended you make the following change in your medication:  DECREASE: Metoprolol succinate 25 mg once daily *If you need a refill on your cardiac medications before your next appointment, please call your pharmacy*   Lab Work: None If you have labs (blood work) drawn today and your tests are completely normal, you will receive your results only by: MyChart Message (if you have MyChart) OR A paper copy in the mail If you have any lab test that is abnormal or we need to change your treatment, we will call you to review the results.   Testing/Procedures: None   Follow-Up: At [facility], you and your health needs are our priority.  As part of our continuing mission to provide you with exceptional heart care, we have created designated Provider Care Teams.  These Care Teams include your primary Cardiologist (physician) and Advanced Practice Providers (APPs -  Physician Assistants and Nurse Practitioners) who all work together to provide you with the care you need, when you need it.  We recommend signing up for the patient portal called "MyChart".  Sign up information is provided on this After Visit Summary.  MyChart is used to connect with patients for Virtual Visits (Telemedicine).  Patients are able to view lab/test results, encounter notes, upcoming appointments, etc.  Non-urgent messages can be sent to your provider as well.   To learn more about what you can do with MyChart, go to [email or web address].    Your next appointment:   4 month(s)  The format for your next appointment:   In Person  Provider:   [address] - Thomasene Ripple, DO    Other Instructions

## 2020-12-19 ENCOUNTER — Ambulatory Visit: Payer: Medicare Other

## 2020-12-19 NOTE — Progress Notes (Deleted)
12/19/2020 Herbert Evans [birth year] [id number]   HPI:  Herbert Evans is a 75 y.o. male patient of Dr Servando Salina, who presents today for a lipid clinic evaluation.  See pertinent past medical history below.  Past Medical History: hypertension   NSVT   DM2             Current Medications:  Cholesterol Goals:   Intolerant/previously tried:  Family history:   Diet:   Exercise:    Labs: 8/22:  TC 232, TG 116, HDL 66, LDL 146   Current Outpatient Medications  Medication Sig Dispense Refill   albuterol (ACCUNEB) 0.63 MG/3ML nebulizer solution Take 1 ampule by nebulization every 6 (six) hours as needed for wheezing.     amiodarone (PACERONE) 200 MG tablet Take 400 mg (two tablets)  twice daily for 7 days then 200 mg (one tablet) once daily. 90 tablet 3   aspirin EC 81 MG tablet Take 81 mg by mouth every morning.     Budeson-Glycopyrrol-Formoterol (BREZTRI AEROSPHERE) 160-9-4.8 MCG/ACT AERO Inhale 2 puffs into the lungs daily.     budesonide-formoterol (SYMBICORT) 80-4.5 MCG/ACT inhaler Inhale 2 puffs into the lungs in the morning and at bedtime. With spacer. 1 each 12   doxycycline (VIBRA-TABS) 100 MG tablet Take 1 tablet (100 mg total) by mouth every 12 (twelve) hours. 6 tablet 0   ezetimibe (ZETIA) 10 MG tablet Take 1 tablet (10 mg total) by mouth daily. 90 tablet 3   fluticasone (FLONASE) 50 MCG/ACT nasal spray Place 1 spray into both nostrils daily as needed for allergies or rhinitis.     memantine (NAMENDA) 5 MG tablet Take 5 mg by mouth 2 (two) times daily.     metFORMIN (GLUCOPHAGE) 1000 MG tablet Take 1,000 mg by mouth 2 (two) times daily with a meal.      metoprolol succinate (TOPROL XL) 25 MG 24 hr tablet Take 0.5 tablets (12.5 mg total) by mouth daily. 45 tablet 3   Pitavastatin Calcium (LIVALO) 2 MG TABS Take 1 tablet (2 mg total) by mouth daily. 90 tablet 3   traZODone (DESYREL) 100 MG tablet Take 100 mg by mouth at bedtime.     No current facility-administered  medications for this visit.    Allergies  Allergen Reactions   Atorvastatin Other (See Comments)    myalgias   Rosuvastatin Other (See Comments)    myalgias    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    DOE (dyspnea on exertion)    History of colon cancer    03-01-2005   s/p  sigmoid colectomy w/ negative nodes  (07-27-2019  per pt last colonoscopy 2019 , no recurrence)   History of Helicobacter pylori infection    09/ 2006   History of kidney stones    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   Hypertension    Mixed hyperlipidemia    Paroxysmal ventricular tachycardia [facility])    cardiologist--- dr Desma Maxim    (07-27-2019  per pt no issues or s&s since 2018)  takes BB   Pneumonia    RBBB (right bundle branch block)    Right ureteral stone    Type 2 diabetes mellitus (HCC)    followed by pcp --- (07-27-2019  checks blood sugar three times per week in  am,  fasting sugar-- 125-160)   Wears dentures    upper    There were no vitals taken for this visit.   No problem-specific Assessment & Plan notes found for this encounter.   Phillips Hay PharmD CPP [facility] [address] [place], [place] [address] [phone]

## 2020-12-25 ENCOUNTER — Encounter: Payer: Self-pay | Admitting: *Deleted

## 2021-01-02 ENCOUNTER — Other Ambulatory Visit: Payer: Self-pay | Admitting: Cardiology

## 2021-01-25 ENCOUNTER — Telehealth: Payer: Self-pay

## 2021-01-25 DIAGNOSIS — E785 Hyperlipidemia, unspecified: Secondary | ICD-10-CM

## 2021-01-25 NOTE — Telephone Encounter (Signed)
Called to let pt know his lipids were still elevated per the labs we received from his PCP. Dr. Servando Salina would like them repeated in November. Verbalize understanding.

## 2021-02-15 ENCOUNTER — Other Ambulatory Visit: Payer: Self-pay

## 2021-02-15 ENCOUNTER — Telehealth: Payer: Self-pay | Admitting: Cardiology

## 2021-02-15 MED ORDER — METOPROLOL SUCCINATE ER 25 MG PO TB24: 12.5000 mg | ORAL_TABLET | Freq: Every day | ORAL | 3 refills | Status: DC

## 2021-02-15 NOTE — Telephone Encounter (Signed)
*  STAT* If patient is at the pharmacy, call can be transferred to refill team.   1. Which medications need to be refilled? (please list name of each medication and dose if known) metoprolol succinate (TOPROL XL) 25 MG 24 hr tablet  2. Which pharmacy/location (including street and city if local pharmacy) is medication to be sent to? [id number] - [place], [place] - [address]  3. Do they need a 30 day or 90 day supply? 30 ds

## 2021-04-09 ENCOUNTER — Ambulatory Visit: Payer: Medicare Other | Admitting: Cardiology

## 2021-04-09 ENCOUNTER — Encounter: Payer: Self-pay | Admitting: Cardiology

## 2021-04-09 ENCOUNTER — Other Ambulatory Visit: Payer: Self-pay

## 2021-04-09 VITALS — BP 128/62 | HR 68 | Resp 20 | Ht 70.0 in | Wt 205.2 lb

## 2021-04-09 DIAGNOSIS — E782 Mixed hyperlipidemia: Secondary | ICD-10-CM

## 2021-04-09 DIAGNOSIS — I10 Essential (primary) hypertension: Secondary | ICD-10-CM | POA: Diagnosis not present

## 2021-04-09 DIAGNOSIS — I493 Ventricular premature depolarization: Secondary | ICD-10-CM

## 2021-04-09 DIAGNOSIS — E118 Type 2 diabetes mellitus with unspecified complications: Secondary | ICD-10-CM

## 2021-04-09 DIAGNOSIS — I4729 Other ventricular tachycardia: Secondary | ICD-10-CM

## 2021-04-09 DIAGNOSIS — Z79899 Other long term (current) drug therapy: Secondary | ICD-10-CM | POA: Diagnosis not present

## 2021-04-09 NOTE — Patient Instructions (Signed)
Medication Instructions:  Your physician recommends that you continue on your current medications as directed. Please refer to the Current Medication list given to you today.  *If you need a refill on your cardiac medications before your next appointment, please call your pharmacy*   Lab Work: Your physician recommends that you return for lab work in:  TODAY: BMET, [place] If you have labs (blood work) drawn today and your tests are completely normal, you will receive your results only by: MyChart Message (if you have MyChart) OR A paper copy in the mail If you have any lab test that is abnormal or we need to change your treatment, we will call you to review the results.   Testing/Procedures: None   Follow-Up: At [facility], you and your health needs are our priority.  As part of our continuing mission to provide you with exceptional heart care, we have created designated Provider Care Teams.  These Care Teams include your primary Cardiologist (physician) and Advanced Practice Providers (APPs -  Physician Assistants and Nurse Practitioners) who all work together to provide you with the care you need, when you need it.  We recommend signing up for the patient portal called "MyChart".  Sign up information is provided on this After Visit Summary.  MyChart is used to connect with patients for Virtual Visits (Telemedicine).  Patients are able to view lab/test results, encounter notes, upcoming appointments, etc.  Non-urgent messages can be sent to your provider as well.   To learn more about what you can do with MyChart, go to [email or web address].    Your next appointment:   6 month(s)  The format for your next appointment:   In Person  Provider:   [name], DO     Other Instructions   

## 2021-04-09 NOTE — Progress Notes (Signed)
Cardiology Office Note:    Date:  04/10/2021   ID:  Herbert Evans, DOB [birth year], MRN [id number]  PCP:  Herbert Riches, NP  Cardiologist:  Herbert Salines, DO  Electrophysiologist:  None   Referring MD: Herbert Riches, NP   " I am doing ok"  History of Present Illness:    Herbert Evans is a 75 y.o. male with a hx of hyperlipidemia statin intolerant history of ventricular tachycardia on beta-blocker, Type 2 Diabetes.   I saw the patient in April 2021 at that time we discussed medication choices given the fact that he was intolerant to statin but could not afford PCSK9 inhibitors.  I started the patient on Pitvastatin and zetia.   I saw the patient on July 2021 the patient experiences some shortness of breath I ordered an echocardiogram to reassess his LV function as well as review his cardiac catheterization from 2018 which was normal.  We discussed a follow-up for 3 months unfortunately the patient did not follow-up.   I saw the patient on November 15, 2020 at that time he reported he had been experiencing some palpitation shortness of breath the office to monitor the patient when he did wear in the interim which showed episodes of NSVT, occasional PVCs as well as paroxysmal atrial tachycardia.  First we increase his beta-blocker but the patient called reporting that he has been still experiencing increasing heart rate.  In the interim I started the patient on amiodarone for 400 mg 7 days a day and then to convert to 200 mg daily.  In addition during his last visit we also reviewed his nuclear stress test which was done at [facility] showing normal nuclear stress test-with no evidence of ischemia.  He was seen on December 13, 2020 at that time we will continue his medication regimen which include amiodarone 200 mg daily with the metoprolol.  Also did have a rash and advised the patient to use over-the-counter hydrocortisone. Since I saw the patient he has been doing well from a cardiovascular  standpoint.  He has had some shortness of breath which he contributes to his lung disease.  He has recently been diagnosed with sleep apnea and is pending his CPAP.   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    DOE (dyspnea on exertion)    History of colon cancer    03-01-2005   s/p  sigmoid colectomy w/ negative nodes  (07-27-2019  per pt last colonoscopy 2019 , no recurrence)   History of Helicobacter pylori infection    09/ 2006   History of kidney stones    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   Hypertension    Mixed hyperlipidemia    Paroxysmal ventricular tachycardia    cardiologist--- dr Herbert Evans    (07-27-2019  per pt no issues or s&s since 2018)  takes BB   Pneumonia    RBBB (right bundle branch block)    Right ureteral stone    Type 2 diabetes mellitus (Herbert Evans)    followed by pcp --- (07-27-2019  checks blood sugar three times per week in am,  fasting sugar-- 125-160)   Wears dentures    upper    Past Surgical History:  Procedure Laterality Date   CARDIAC CATHETERIZATION  02-09-2002  dr Herbert Evans  @[facility]    ef 50-55% w/ 10-15% LAD   CARDIAC CATHETERIZATION  06-28-2016   dr 06-30-2016 @[facility]    for arrhythmia)--- normal coronaries, mild focal wall motion abnormallity of distal inferapical wall,  severe right innominate / subclavin tortuosity   CATARACT EXTRACTION W/ INTRAOCULAR LENS  IMPLANT, BILATERAL  2018   COLON RESECTION SIGMOID  03-01-2005   @[facility]    CYSTOSCOPY WITH RETROGRADE PYELOGRAM, URETEROSCOPY AND STENT PLACEMENT Right 08/03/2019   Procedure: CYSTOSCOPY WITH RIGHT  RETROGRADE PYELOGRAM, URETEROSCOPY HOLMIUMM LASER AND STENT PLACEMENT;  Surgeon: 03-03-2005, MD;  Location: [facility];  Service: Urology;  Laterality: Right;   CYSTOSCOPY/RETROGRADE/URETEROSCOPY/STONE EXTRACTION WITH BASKET  1970s   LUNG REMOVAL,  PARTIAL Left 1967   lower lobe for pneumonia per pt   TONSILLECTOMY AND ADENOIDECTOMY  child    Current Medications: Current Meds  Medication Sig   albuterol (ACCUNEB) 0.63 MG/3ML nebulizer solution Take 1 ampule by nebulization every 6 (six) hours as needed for wheezing.   amiodarone (PACERONE) 200 MG tablet Take 400 mg (two tablets)  twice daily for 7 days then 200 mg (one tablet) once daily.   aspirin EC 81 MG tablet Take 81 mg by mouth every morning.   budesonide-formoterol (SYMBICORT) 80-4.5 MCG/ACT inhaler Inhale 2 puffs into the lungs in the morning and at bedtime. With spacer.   ezetimibe (ZETIA) 10 MG tablet Take 1 tablet by mouth once daily   fluticasone (FLONASE) 50 MCG/ACT nasal spray Place 1 spray into both nostrils daily as needed for allergies or rhinitis.   memantine (NAMENDA) 5 MG tablet Take 5 mg by mouth 2 (two) times daily.   metFORMIN (GLUCOPHAGE) 1000 MG tablet Take 1,000 mg by mouth 2 (two) times daily with a meal.    metoprolol succinate (TOPROL XL) 25 MG 24 hr tablet Take 0.5 tablets (12.5 mg total) by mouth daily.   Pitavastatin Calcium (LIVALO) 2 MG TABS Take 1 tablet (2 mg total) by mouth daily.   traZODone (DESYREL) 100 MG tablet Take 100 mg by mouth at bedtime.     Allergies:   Atorvastatin and Rosuvastatin   Social History   Socioeconomic History   Marital status: Married    Spouse name: Not on file   Number of children: Not on file   Years of education: Not on file   Highest education level: Not on file  Occupational History   Not on file  Tobacco Use   Smoking status: Former    Types: Cigars   Smokeless tobacco: Current    Types: Snuff, Chew   Tobacco comments:    01/26/20 dips still last cigar was in 1976  Substance and Sexual Activity   Alcohol use: No   Drug use: Never   Sexual activity: Not on file  Other Topics Concern   Not on file  Social History Narrative   Not on file   Social Determinants of Health   Financial Resource  Strain: Not on file  Food Insecurity: Not on file  Transportation Needs: Not on file  Physical Activity: Not on file  Stress: Not on file  Social Connections: Not on file     Family History: The patient's family history includes Asthma in his mother; Heart attack in his mother; Lung cancer in his sister, sister, and sister; Stroke in his father.  ROS:   Review of Systems  Constitution: Negative for decreased appetite, fever and weight gain.  HENT: Negative for congestion, ear discharge, hoarse voice and sore  throat.   Eyes: Negative for discharge, redness, vision loss in right eye and visual halos.  Cardiovascular: Negative for chest pain, dyspnea on exertion, leg swelling, orthopnea and palpitations.  Respiratory: Negative for cough, hemoptysis, shortness of breath and snoring.   Endocrine: Negative for heat intolerance and polyphagia.  Hematologic/Lymphatic: Negative for bleeding problem. Does not bruise/bleed easily.  Skin: Negative for flushing, nail changes, rash and suspicious lesions.  Musculoskeletal: Negative for arthritis, joint pain, muscle cramps, myalgias, neck pain and stiffness.  Gastrointestinal: Negative for abdominal pain, bowel incontinence, diarrhea and excessive appetite.  Genitourinary: Negative for decreased libido, genital sores and incomplete emptying.  Neurological: Negative for brief paralysis, focal weakness, headaches and loss of balance.  Psychiatric/Behavioral: Negative for altered mental status, depression and suicidal ideas.  Allergic/Immunologic: Negative for HIV exposure and persistent infections.    EKGs/Labs/Other Studies Reviewed:    The following studies were reviewed today:   EKG:  None today  Zio monitor Patch Wear Time:  13 days and 23 hours November 20, 2020 Indication: Palpitations   patient had a min HR of 49 bpm, max HR of 200 bpm, and avg HR of 67 bpm. Predominant underlying rhythm was Sinus Rhythm.   21 Ventricular Tachycardia  runs occurred, the run with the fastest interval lasting 5 beats with a max rate of 179 bpm, the longest lasting 6 beats with an avg rate of 145 bpm.   62 Supraventricular Tachycardia runs occurred, the run with the fastest interval lasting 1 min 33 secs with a max rate of 200 bpm, the longest lasting 2 hours 38 mins with an avg rate of 154 bpm.     Premature atrial complexes were rare (<1.0%). Premature ventricular complexes were occasional (4.7%, 55601). Ventricular Bigeminy and Trigeminy were present.   Symptoms associated with this supraventricular tachycardia, and occasional premature ventricular complexes.   Conclusion: This study is remarkable for                                              1.  Several episodes of nonsustained ventricular tachycardia                                             2.  Paroxysmal supraventricular tachycardia which is likely atrial tachycardia with variable block                                             3.  Occasional premature ventricular complex       TTE 10/15/2020 IMPRESSIONS   1. Left ventricular ejection fraction, by estimation, is 55 to 60%. The  left ventricle has normal function. The left ventricle has no regional  wall motion abnormalities. Left ventricular diastolic parameters are  consistent with Grade I diastolic  dysfunction (impaired relaxation).   2. Right ventricular systolic function is normal. The right ventricular size is mildly enlarged. There is mildly elevated pulmonary artery  systolic pressure. The estimated right ventricular systolic pressure is 123XX123 mmHg.   3. Left atrial size was mildly dilated.   4. The mitral valve is grossly normal. Mild to possibly moderate, eccentric mitral valve regurgitation.  5. The aortic valve is tricuspid. There is mild calcification of the aortic valve. Aortic valve regurgitation is trivial.   6. The inferior vena cava is normal in size with greater than 50% respiratory variability, suggesting  right atrial pressure of 3 mmHg.   FINDINGS   Left Ventricle: Left ventricular ejection fraction, by estimation, is 55 to 60%. The left ventricle has normal function. The left ventricle has no regional wall motion abnormalities. The left ventricular internal cavity size was normal in size. There is  no left ventricular hypertrophy. Left ventricular diastolic parameters are consistent with Grade I diastolic dysfunction (impaired relaxation).   Right Ventricle: The right ventricular size is mildly enlarged. No increase in right ventricular wall thickness. Right ventricular systolic function is normal. There is mildly elevated pulmonary artery systolic pressure. The tricuspid regurgitant velocity is 2.93 m/s, and with an assumed right atrial pressure of 3 mmHg, the estimated right ventricular systolic pressure is 123XX123 mmHg.   Left Atrium: Left atrial size was mildly dilated.   Right Atrium: Right atrial size was normal in size.   Pericardium: There is no evidence of pericardial effusion.   Mitral Valve: The mitral valve is grossly normal. Mild mitral valve regurgitation.   Tricuspid Valve: The tricuspid valve is grossly normal. Tricuspid valve regurgitation is trivial.   Aortic Valve: The aortic valve is tricuspid. There is mild calcification  of the aortic valve. There is moderate aortic valve annular calcification. Aortic valve regurgitation is trivial.   Pulmonic Valve: The pulmonic valve was grossly normal. Pulmonic valve regurgitation is trivial.   Aorta: The aortic root is normal in size and structure.   Venous: The inferior vena cava is normal in size with greater than 50% respiratory variability, suggesting right atrial pressure of 3 mmHg.   IAS/Shunts: No atrial level shunt detected by color flow Doppler.   Recent Labs: 10/15/2020: TSH 2.336 12/07/2020: B Natriuretic Peptide 240.1; Hemoglobin 15.9; Platelets 188 12/11/2020: ALT 27 04/09/2021: BUN 17; Creatinine, Ser 1.08; Magnesium  2.1; Potassium 4.7; Sodium 138  Recent Lipid Panel    Component Value Date/Time   CHOL 232 (H) 12/11/2020 0929   TRIG 116 12/11/2020 0929   HDL 66 12/11/2020 0929   CHOLHDL 3.5 12/11/2020 0929   LDLCALC 146 (H) 12/11/2020 0929    Physical Exam:    VS:  BP 128/62 (BP Location: Left Arm, Patient Position: Sitting, Cuff Size: Normal)   Pulse 68   Resp 20   Ht 5\' 10"  (1.778 m)   Wt 205 lb 3.2 oz (93.1 kg)   SpO2 96%   BMI 29.44 kg/m     Wt Readings from Last 3 Encounters:  04/09/21 205 lb 3.2 oz (93.1 kg)  12/13/20 189 lb 1.3 oz (85.8 kg)  12/07/20 187 lb (84.8 kg)     GEN: Well nourished, well developed in no acute distress HEENT: Normal NECK: No JVD; No carotid bruits LYMPHATICS: No lymphadenopathy CARDIAC: S1S2 noted,RRR, no murmurs, rubs, gallops RESPIRATORY:  Clear to auscultation without rales, wheezing or rhonchi  ABDOMEN: Soft, non-tender, non-distended, +bowel sounds, no guarding. EXTREMITIES: No edema, No cyanosis, no clubbing MUSCULOSKELETAL:  No deformity  SKIN: Warm and dry NEUROLOGIC:  Alert and oriented x 3, non-focal PSYCHIATRIC:  Normal affect, good insight  ASSESSMENT:    1. NSVT (nonsustained ventricular tachycardia)   2. Medication management   3. Primary hypertension   4. Occasional PVCs   5. Type 2 diabetes mellitus with complication, without long-term current use of insulin ([place])  6. Mixed hyperlipidemia    PLAN:    We will continue patient on the amiodarone and metoprolol for now.  He has not had any palpitations and his heart rate has been controlled since being on these medication. Blood pressure is acceptable, continue with current antihypertensive regimen. Hyperlipidemia - continue with current statin medication. Diabetes-this is being managed by his primary care doctor.  No adjustments for antidiabetic medications were made today.    The patient is in agreement with the above plan. The patient left the office in stable condition.   The patient will follow up in 6 months or sooner if needed.   Medication Adjustments/Labs and Tests Ordered: Current medicines are reviewed at length with the patient today.  Concerns regarding medicines are outlined above.  Orders Placed This Encounter  Procedures   Basic Metabolic Panel (BMET)   Magnesium   No orders of the defined types were placed in this encounter.   Patient Instructions  Medication Instructions:  Your physician recommends that you continue on your current medications as directed. Please refer to the Current Medication list given to you today.  *If you need a refill on your cardiac medications before your next appointment, please call your pharmacy*   Lab Work: Your physician recommends that you return for lab work in:  TODAY: BMET, [place] If you have labs (blood work) drawn today and your tests are completely normal, you will receive your results only by: MyChart Message (if you have [place]) OR A paper copy in the mail If you have any lab test that is abnormal or we need to change your treatment, we will call you to review the results.   Testing/Procedures: None   Follow-Up: At [facility], you and your health needs are our priority.  As part of our continuing mission to provide you with exceptional heart care, we have created designated Provider Care Teams.  These Care Teams include your primary Cardiologist (physician) and Advanced Practice Providers (APPs -  Physician Assistants and Nurse Practitioners) who all work together to provide you with the care you need, when you need it.  We recommend signing up for the patient portal called "MyChart".  Sign up information is provided on this After Visit Summary.  MyChart is used to connect with patients for Virtual Visits (Telemedicine).  Patients are able to view lab/test results, encounter notes, upcoming appointments, etc.  Non-urgent messages can be sent to your provider as well.   To learn more about what  you can do with MyChart, go to [email or web address].    Your next appointment:   6 month(s)  The format for your next appointment:   In Person  Provider:   Berniece Salines, DO     Other Instructions     Adopting a Healthy Lifestyle.  Know what a healthy weight is for you (roughly BMI <25) and aim to maintain this   Aim for 7+ servings of fruits and vegetables daily   65-80+ fluid ounces of water or unsweet tea for healthy kidneys   Limit to max 1 drink of alcohol per day; avoid smoking/tobacco   Limit animal fats in diet for cholesterol and heart health - choose grass fed whenever available   Avoid highly processed foods, and foods high in saturated/trans fats   Aim for low stress - take time to unwind and care for your mental health   Aim for 150 min of moderate intensity exercise weekly for heart health, and weights twice weekly for bone health  Aim for 7-9 hours of sleep daily   When it comes to diets, agreement about the perfect plan isnt easy to find, even among the experts. Experts at the [employer] developed an idea known as the Healthy Eating Plate. Just imagine a plate divided into logical, healthy portions.   The emphasis is on diet quality:   Load up on vegetables and fruits - one-half of your plate: Aim for color and variety, and remember that potatoes dont count.   Go for whole grains - one-quarter of your plate: Whole wheat, barley, wheat berries, quinoa, oats, brown rice, and foods made with them. If you want pasta, go with whole wheat pasta.   Protein power - one-quarter of your plate: Fish, chicken, beans, and nuts are all healthy, versatile protein sources. Limit red meat.   The diet, however, does go beyond the plate, offering a few other suggestions.   Use healthy plant oils, such as olive, canola, soy, corn, sunflower and peanut. Check the labels, and avoid partially hydrogenated oil, which have unhealthy trans fats.   If  youre thirsty, drink water. Coffee and tea are good in moderation, but skip sugary drinks and limit milk and dairy products to one or two daily servings.   The type of carbohydrate in the diet is more important than the amount. Some sources of carbohydrates, such as vegetables, fruits, whole grains, and beans-are healthier than others.   Finally, stay active  Signed, Herbert Salines, DO  04/10/2021 7:59 AM    [place]

## 2021-04-10 LAB — BASIC METABOLIC PANEL
BUN/Creatinine Ratio: 16 (ref 10–24)
BUN: 17 mg/dL (ref 8–27)
CO2: 22 mmol/L (ref 20–29)
Calcium: 9.6 mg/dL (ref 8.6–10.2)
Chloride: 99 mmol/L (ref 96–106)
Creatinine, Ser: 1.08 mg/dL (ref 0.76–1.27)
Glucose: 137 mg/dL — ABNORMAL HIGH (ref 70–99)
Potassium: 4.7 mmol/L (ref 3.5–5.2)
Sodium: 138 mmol/L (ref 134–144)
eGFR: 72 mL/min/{1.73_m2} (ref >59)

## 2021-04-10 LAB — MAGNESIUM: Magnesium: 2.1 mg/dL (ref 1.6–2.3)

## 2021-09-19 ENCOUNTER — Ambulatory Visit: Payer: Medicare Other | Admitting: Cardiology

## 2021-09-19 ENCOUNTER — Encounter: Payer: Self-pay | Admitting: Cardiology

## 2021-09-19 VITALS — BP 138/70 | HR 67 | Ht 70.0 in | Wt 201.8 lb

## 2021-09-19 DIAGNOSIS — E118 Type 2 diabetes mellitus with unspecified complications: Secondary | ICD-10-CM | POA: Diagnosis not present

## 2021-09-19 DIAGNOSIS — I10 Essential (primary) hypertension: Secondary | ICD-10-CM | POA: Diagnosis not present

## 2021-09-19 DIAGNOSIS — I493 Ventricular premature depolarization: Secondary | ICD-10-CM

## 2021-09-19 DIAGNOSIS — I4729 Other ventricular tachycardia: Secondary | ICD-10-CM

## 2021-09-19 DIAGNOSIS — E785 Hyperlipidemia, unspecified: Secondary | ICD-10-CM

## 2021-09-19 NOTE — Patient Instructions (Signed)
Medication Instructions:  Your physician recommends that you continue on your current medications as directed. Please refer to the Current Medication list given to you today.  *If you need a refill on your cardiac medications before your next appointment, please call your pharmacy*   Lab Work: None If you have labs (blood work) drawn today and your tests are completely normal, you will receive your results only by: MyChart Message (if you have MyChart) OR A paper copy in the mail If you have any lab test that is abnormal or we need to change your treatment, we will call you to review the results.   Testing/Procedures: None   Follow-Up: At [facility], you and your health needs are our priority.  As part of our continuing mission to provide you with exceptional heart care, we have created designated Provider Care Teams.  These Care Teams include your primary Cardiologist (physician) and Advanced Practice Providers (APPs -  Physician Assistants and Nurse Practitioners) who all work together to provide you with the care you need, when you need it.  We recommend signing up for the patient portal called "MyChart".  Sign up information is provided on this After Visit Summary.  MyChart is used to connect with patients for Virtual Visits (Telemedicine).  Patients are able to view lab/test results, encounter notes, upcoming appointments, etc.  Non-urgent messages can be sent to your provider as well.   To learn more about what you can do with MyChart, go to [email or web address].    Your next appointment:   1 year(s)  The format for your next appointment:   In Person  Provider:   [name], DO     Other Instructions   Important Information About Sugar       

## 2021-09-19 NOTE — Progress Notes (Signed)
?Cardiology Office Note:   ? ?Date:  09/19/2021  ? ?ID:  AYSEN PIECHOWSKI, DOB 30-Oct-1945, MRN [id number] ? ?PCP:  Imagene Riches, NP  ?Cardiologist:  Berniece Salines, DO  ?Electrophysiologist:  None  ? ?Referring MD: Imagene Riches, NP  ? ?"I am doing  ? ?History of Present Illness:   ? ?Herbert Evans is a 76 y.o. male with a hx of    hyperlipidemia statin intolerant history of ventricular tachycardia on beta-blocker, Type 2 Diabetes. ?  ?I saw the patient in April 2021 at that time we discussed medication choices given the fact that he was intolerant to statin but could not afford PCSK9 inhibitors.  I started the patient on Pitvastatin and zetia. ?  ?I saw the patient on July 2021 the patient experiences some shortness of breath I ordered an echocardiogram to reassess his LV function as well as review his cardiac catheterization from 2018 which was normal.  We discussed a follow-up for 3 months unfortunately the patient did not follow-up. ?  ?I saw the patient on November 15, 2020 at that time he reported he had been experiencing some palpitation shortness of breath the office to monitor the patient when he did wear in the interim which showed episodes of NSVT, occasional PVCs as well as paroxysmal atrial tachycardia.  First we increase his beta-blocker but the patient called reporting that he has been still experiencing increasing heart rate.  In the interim I started the patient on amiodarone for 400 mg 7 days a day and then to convert to 200 mg daily.  In addition during his last visit we also reviewed his nuclear stress test which was done at [facility] showing normal nuclear stress test-with no evidence of ischemia. ? ?I saw the patient on December 13, 2021 at that time he was doing well he had responded favorably to the amiodarone. ? ?Today he tells me that he has not had any episodes of significant palpitation hospitalization.  He appears to be doing well. ? ?Past Medical History:  ?Diagnosis Date  ? Arthritis   ?  back, hands  ? Chronic cough   ? 07-27-2019  per pt has had dry chronic cough since 1967 due to partial left lung removal   ? DOE (dyspnea on exertion)   ? History of colon cancer   ? 03-01-2005   s/p  sigmoid colectomy w/ negative nodes  (07-27-2019  per pt last colonoscopy 2019 , no recurrence)  ? History of Helicobacter pylori infection   ? 09/ 2006  ? History of kidney stones   ? History of pneumonectomy   ? 1967  left lower lobe  ? Hypertension   ? Mixed hyperlipidemia   ? Paroxysmal ventricular tachycardia ([place])   ? cardiologist--- dr Beatrix Fetters    (07-27-2019  per pt no issues or s&s since 2018)  takes BB  ? Pneumonia   ? RBBB (right bundle branch block)   ? Right ureteral stone   ? Type 2 diabetes mellitus ([place])   ? followed by pcp --- (07-27-2019  checks blood sugar three times per week in am,  fasting sugar-- 125-160)  ? Wears dentures   ? upper  ? ? ?Past Surgical History:  ?Procedure Laterality Date  ? CARDIAC CATHETERIZATION  02-09-2002   dr Terrence Dupont  @[facility]   ? ef 50-55% w/ 10-15% LAD  ? CARDIAC CATHETERIZATION  06-28-2016   dr Atilano Median @[facility]   ? for arrhythmia)--- normal coronaries, mild focal wall motion abnormallity  of distal inferapical wall,  severe right innominate / subclavin tortuosity  ? CATARACT EXTRACTION W/ INTRAOCULAR LENS  IMPLANT, BILATERAL  2018  ? COLON RESECTION SIGMOID  03-01-2005   @[facility]   ? CYSTOSCOPY WITH RETROGRADE PYELOGRAM, URETEROSCOPY AND STENT PLACEMENT Right 08/03/2019  ? Procedure: CYSTOSCOPY WITH RIGHT  RETROGRADE PYELOGRAM, URETEROSCOPY [name];  Surgeon: Irine Seal, MD;  Location: [facility];  Service: Urology;  Laterality: Right;  ? CYSTOSCOPY/RETROGRADE/URETEROSCOPY/STONE EXTRACTION WITH BASKET  1970s  ? LUNG REMOVAL, PARTIAL Left 1967  ? lower lobe for pneumonia per pt  ? TONSILLECTOMY AND ADENOIDECTOMY  child  ? ? ?Current Medications: ?Current Meds  ?Medication Sig  ? albuterol (ACCUNEB) 0.63 MG/3ML nebulizer solution Take 1 ampule by  nebulization every 6 (six) hours as needed for wheezing.  ? amiodarone (PACERONE) 200 MG tablet Take 400 mg (two tablets)  twice daily for 7 days then 200 mg (one tablet) once daily.  ? aspirin EC 81 MG tablet Take 81 mg by mouth every morning.  ? budesonide-formoterol (SYMBICORT) 80-4.5 MCG/ACT inhaler Inhale 2 puffs into the lungs in the morning and at bedtime. With spacer.  ? ezetimibe (ZETIA) 10 MG tablet Take 1 tablet by mouth once daily  ? FARXIGA 10 MG TABS tablet Take 10 mg by mouth daily.  ? fluticasone (FLONASE) 50 MCG/ACT nasal spray Place 1 spray into both nostrils daily as needed for allergies or rhinitis.  ? memantine (NAMENDA) 5 MG tablet Take 5 mg by mouth 2 (two) times daily.  ? metFORMIN (GLUCOPHAGE) 1000 MG tablet Take 1,000 mg by mouth 2 (two) times daily with a meal.   ? metoprolol succinate (TOPROL XL) 25 MG 24 hr tablet Take 0.5 tablets (12.5 mg total) by mouth daily.  ? OXYGEN Inhale 2 L into the lungs. At night  ? Pitavastatin Calcium (LIVALO) 2 MG TABS Take 1 tablet (2 mg total) by mouth daily.  ? traZODone (DESYREL) 100 MG tablet Take 100 mg by mouth at bedtime.  ?  ? ?Allergies:   Atorvastatin and Rosuvastatin  ? ?Social History  ? ?Socioeconomic History  ? Marital status: Married  ?  Spouse name: Not on file  ? Number of children: Not on file  ? Years of education: Not on file  ? Highest education level: Not on file  ?Occupational History  ? Not on file  ?Tobacco Use  ? Smoking status: Former  ?  Types: Cigars  ? Smokeless tobacco: Current  ?  Types: Snuff, Chew  ? Tobacco comments:  ?  01/26/20 dips still last cigar was in 1976  ?Substance and Sexual Activity  ? Alcohol use: No  ? Drug use: Never  ? Sexual activity: Not on file  ?Other Topics Concern  ? Not on file  ?Social History Narrative  ? Not on file  ? ?Social Determinants of Health  ? ?Financial Resource Strain: Not on file  ?Food Insecurity: Not on file  ?Transportation Needs: Not on file  ?Physical Activity: Not on file   ?Stress: Not on file  ?Social Connections: Not on file  ?  ? ?Family History: ?The patient's family history includes Asthma in his mother; Heart attack in his mother; Lung cancer in his sister, sister, and sister; Stroke in his father. ? ?ROS:   ?Review of Systems  ?Constitution: Negative for decreased appetite, fever and weight gain.  ?HENT: Negative for congestion, ear discharge, hoarse voice and sore throat.   ?Eyes: Negative for discharge, redness, vision  loss in right eye and visual halos.  ?Cardiovascular: Negative for chest pain, dyspnea on exertion, leg swelling, orthopnea and palpitations.  ?Respiratory: Negative for cough, hemoptysis, shortness of breath and snoring.   ?Endocrine: Negative for heat intolerance and polyphagia.  ?Hematologic/Lymphatic: Negative for bleeding problem. Does not bruise/bleed easily.  ?Skin: Negative for flushing, nail changes, rash and suspicious lesions.  ?Musculoskeletal: Negative for arthritis, joint pain, muscle cramps, myalgias, neck pain and stiffness.  ?Gastrointestinal: Negative for abdominal pain, bowel incontinence, diarrhea and excessive appetite.  ?Genitourinary: Negative for decreased libido, genital sores and incomplete emptying.  ?Neurological: Negative for brief paralysis, focal weakness, headaches and loss of balance.  ?Psychiatric/Behavioral: Negative for altered mental status, depression and suicidal ideas.  ?Allergic/Immunologic: Negative for HIV exposure and persistent infections.  ? ? ?EKGs/Labs/Other Studies Reviewed:   ? ?The following studies were reviewed today: ? ? ?EKG: None today ? ?TTE 12/13/2020 ?Zio monitor ?Patch Wear Time:  13 days and 23 hours November 20, 2020 ?Indication: Palpitations ?  ?patient had a min HR of 49 bpm, max HR of 200 bpm, and avg HR of 67 bpm. ?Predominant underlying rhythm was Sinus Rhythm. ?  ?21 Ventricular Tachycardia runs occurred, the run with the fastest interval lasting 5 beats with a max rate of 179 bpm, the  longest ?lasting 6 beats with an avg rate of 145 bpm. ?  ?62 Supraventricular Tachycardia runs occurred, the run with the fastest interval lasting 1 min 33 secs with a max rate of 200 bpm, the longest lasting 2 hours 38 mi

## 2021-10-10 ENCOUNTER — Telehealth: Payer: Self-pay | Admitting: Cardiology

## 2021-10-10 NOTE — Telephone Encounter (Signed)
Called patient, advised of message below.  Patient wife verbalized understanding.

## 2021-10-10 NOTE — Telephone Encounter (Signed)
STAT if HR is under 50 or over 120 (normal HR is 60-100 beats per minute)  What is your heart rate? 61 (taken prior to phone call)  Do you have a log of your heart rate readings (document readings)?  39, 40 last night but gradually came up 107/49   Do you have any other symptoms? Cold sweats over the weekend, headaches and occasional dizzy spells but not now   Patient's S/O notes that the patient was up and went to the store for batteries

## 2021-10-10 NOTE — Telephone Encounter (Signed)
Contacted patient and wife- advised that he had an episode of low HR last night. She states her husband was laying the bed when he became hot and clammy- she states they got up and checked his blood sugar, HR and BP- blood sugar was 149, HR was 39, and BP was 164/99. She states they stayed awake, and eventually the HR went back up to 40 and 41- and he began to feel better. This morning his BP was 107/49, then later in the day it was 153/49 HR in the 60's. Patient wife states he has been tired, just not feeling the best, clammy, and dizzy- they state his blood sugars have been staying around 160.  Last night was the only time they caught his HR being this low, but unsure if this has happened before.   Recently seen Dr.Tobb on 05/10- this visit states he was feeling well. Wife states this has been going on the last few weeks since this visit. Patient wife states she does not believe any medication changes have occurred, but her niece helps them with the medications so she is unsure of this.   I advised I would route to MD to make aware and give recommendations- wife and patient verbalized understanding.

## 2021-11-08 ENCOUNTER — Other Ambulatory Visit: Payer: Self-pay | Admitting: Cardiology

## 2021-12-30 IMAGING — CT CT CHEST W/O CM
2 of 4 series · 15 of 36 positions shown, 18 images · non-contrast
Comparison: 09/17/2017

CLINICAL DATA: Shortness of breath, pneumonia, history of
pneumonectomy

EXAM:
CT CHEST WITHOUT CONTRAST
TECHNIQUE: Multidetector CT imaging of the chest was performed following the
standard protocol without IV contrast.

[Series 3: lungs · axial · 0.71mm/px · z∈[+1135,+1417]mm · 12 of 167 slices shown, 15 images]
[im 13/167  mediastinal]
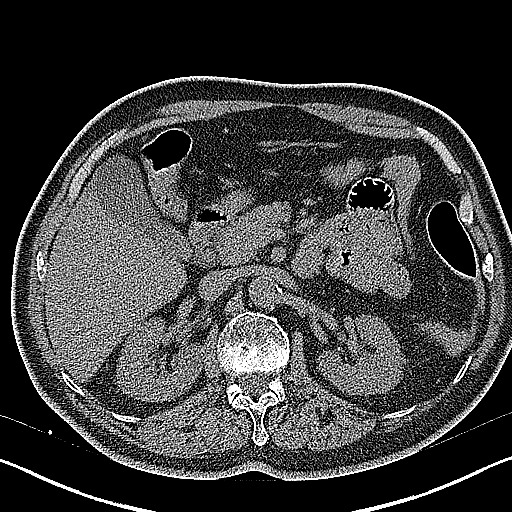
[im 13/167  lung]
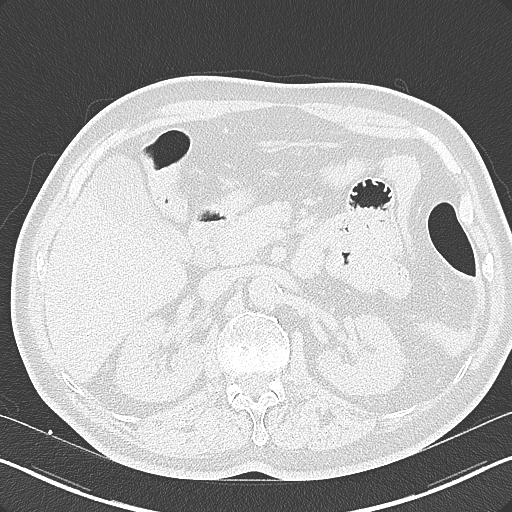
[im 26/167  lung]
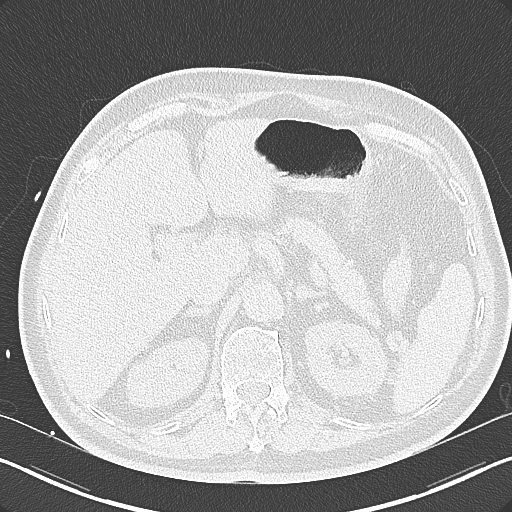
[im 39/167  lung]
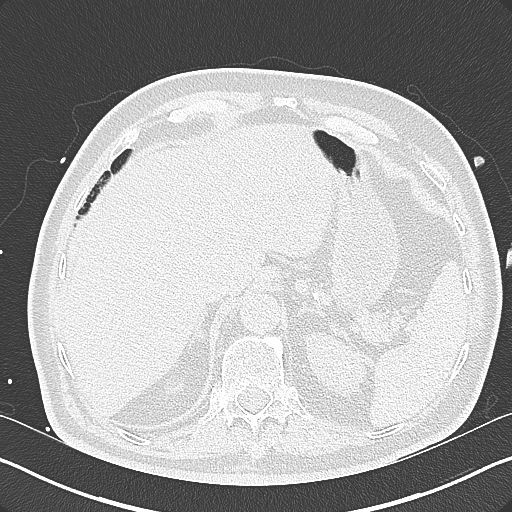
[im 52/167  lung]
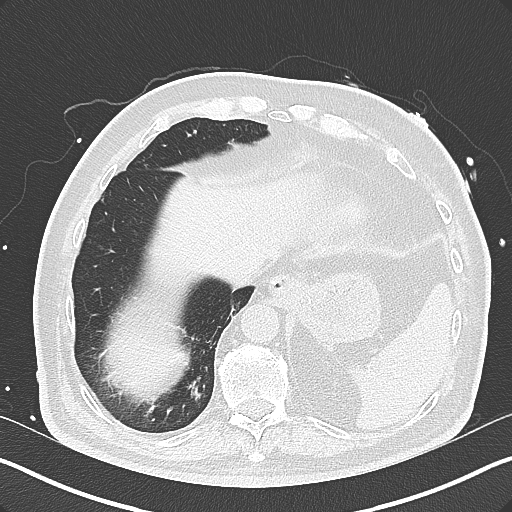
[im 64/167  mediastinal]
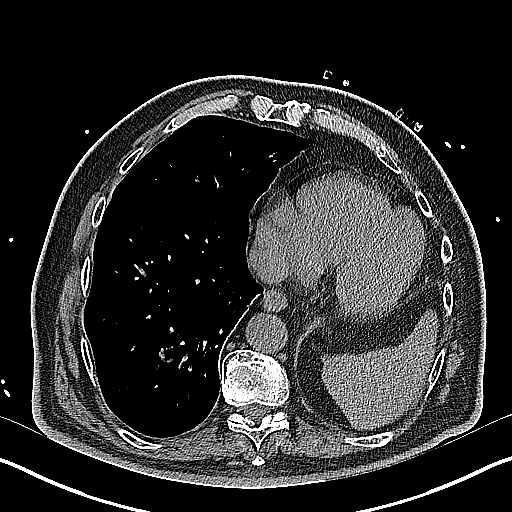
[im 64/167  lung]
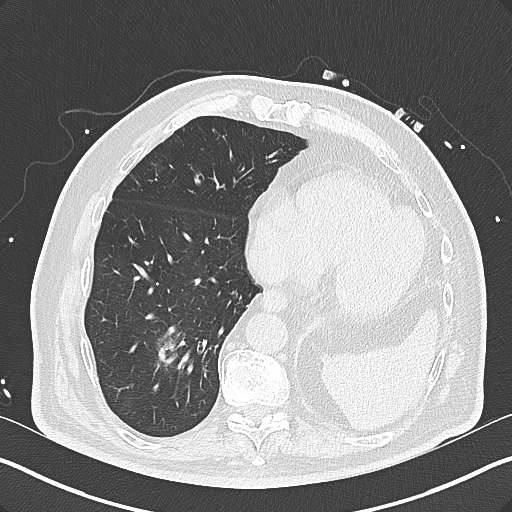
[im 77/167  lung]
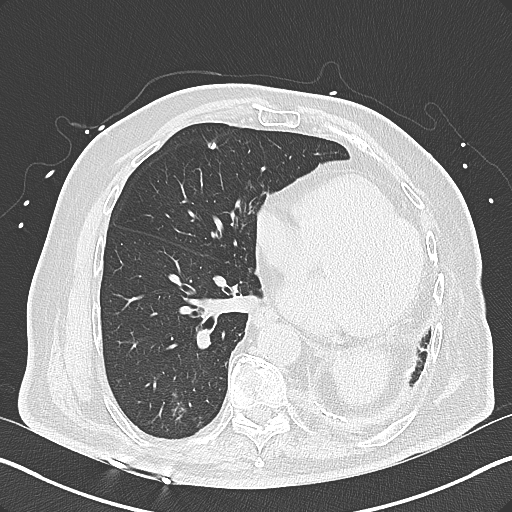
[im 90/167  lung]
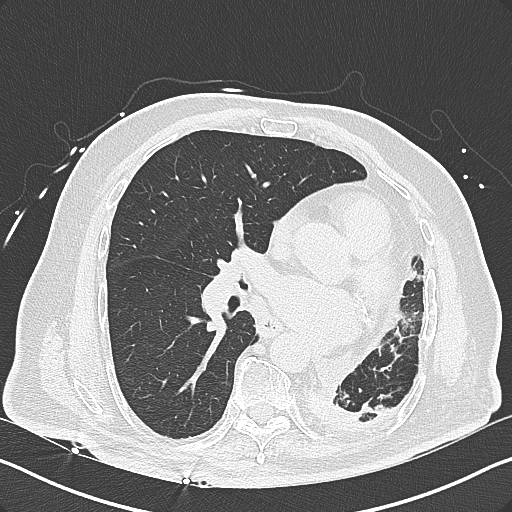
[im 103/167  lung]
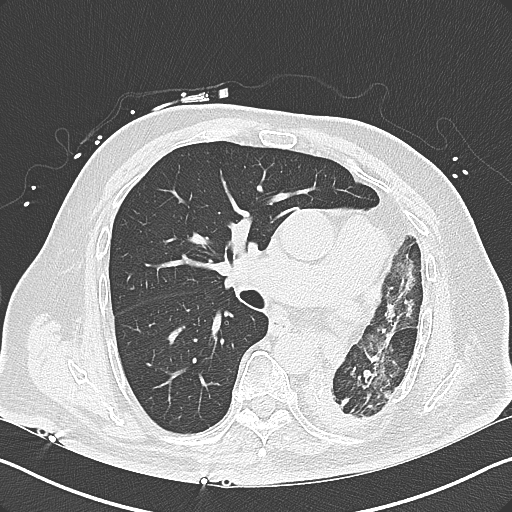
[im 115/167  mediastinal]
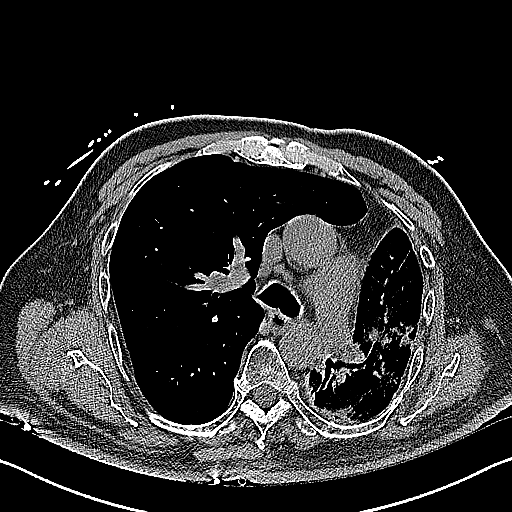
[im 115/167  lung]
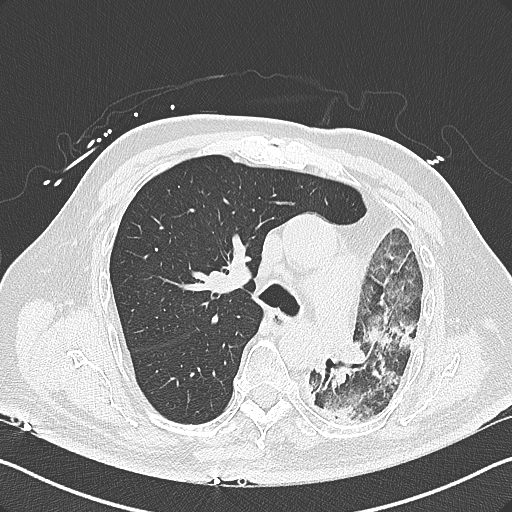
[im 128/167  lung]
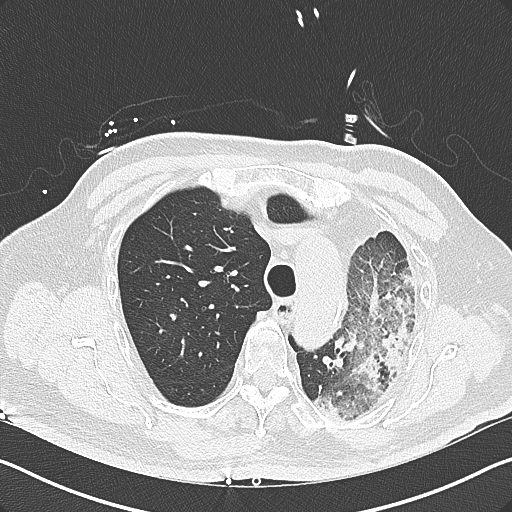
[im 141/167  lung]
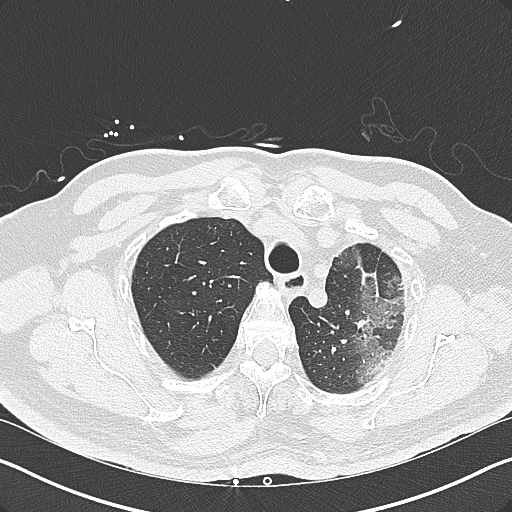
[im 154/167  lung]
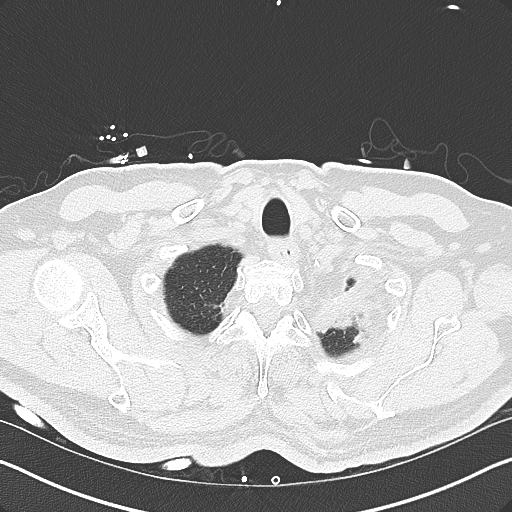

[Series 6: cor · coronal · 0.66mm/px · 3 of 153 slices shown]
[im 31/153  lung]
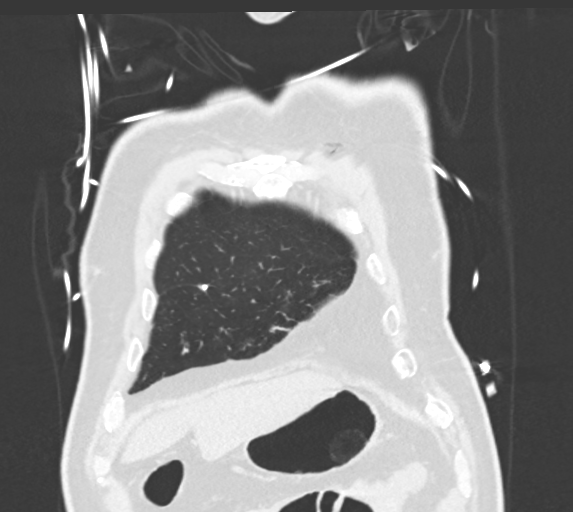
[im 61/153  lung]
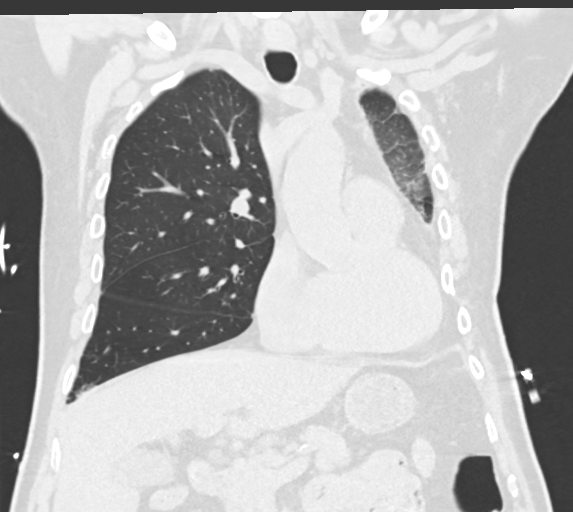
[im 92/153  lung]
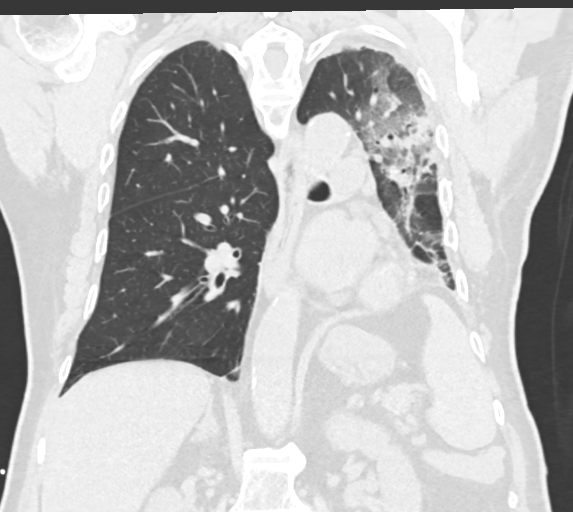

[15 of 36 positions shown; findings below may reference images not displayed]

FINDINGS: Cardiovascular: Aortic atherosclerosis. Normal heart size. Left
coronary artery calcifications. No pericardial effusion. Enlargement
of the main pulmonary artery measuring up to 3.7 cm in caliber.

Mediastinum/Nodes: Leftward shift of the mediastinal contents. No
enlarged mediastinal, hilar, or axillary lymph nodes. Thyroid gland,
trachea, and esophagus demonstrate no significant findings.

Lungs/Pleura: Redemonstrated postoperative findings of left lower
lobectomy. There is acutely superimposed heterogeneous and
ground-glass airspace disease throughout the left upper lobe and
scattered in the dependent right lower lobe (series 3, image 44,
97). No pleural effusion or pneumothorax.

Upper Abdomen: No acute abnormality.  Hepatic steatosis.

Musculoskeletal: No chest wall mass or suspicious bone lesions
identified. Disc degenerative disease and ankylosis of the thoracic
spine.
IMPRESSION: 1. Redemonstrated postoperative findings of left lower lobectomy.
2. There is acutely superimposed heterogeneous and ground-glass
airspace disease throughout the left upper lobe and scattered in the
dependent right lower lobe. Findings are consistent with multifocal
infection.
3. Enlargement of the main pulmonary artery measuring up to 3.7 cm
in caliber, as can be seen in pulmonary hypertension.
4. Coronary artery disease.
5. Hepatic steatosis.

Aortic Atherosclerosis ([id number]-UZG.G).

## 2022-01-12 ENCOUNTER — Other Ambulatory Visit: Payer: Self-pay | Admitting: Cardiology

## 2022-01-18 ENCOUNTER — Ambulatory Visit: Payer: Medicare Other | Attending: Physician Assistant | Admitting: Physician Assistant

## 2022-01-18 ENCOUNTER — Encounter: Payer: Self-pay | Admitting: Physician Assistant

## 2022-01-18 VITALS — BP 124/76 | HR 116 | Ht 71.0 in | Wt 194.4 lb

## 2022-01-18 DIAGNOSIS — R002 Palpitations: Secondary | ICD-10-CM

## 2022-01-18 DIAGNOSIS — R0600 Dyspnea, unspecified: Secondary | ICD-10-CM

## 2022-01-18 DIAGNOSIS — E119 Type 2 diabetes mellitus without complications: Secondary | ICD-10-CM

## 2022-01-18 DIAGNOSIS — I4729 Other ventricular tachycardia: Secondary | ICD-10-CM

## 2022-01-18 DIAGNOSIS — E785 Hyperlipidemia, unspecified: Secondary | ICD-10-CM | POA: Diagnosis not present

## 2022-01-18 DIAGNOSIS — I10 Essential (primary) hypertension: Secondary | ICD-10-CM

## 2022-01-18 MED ORDER — METOPROLOL SUCCINATE ER 25 MG PO TB24: 12.5000 mg | ORAL_TABLET | Freq: Every day | ORAL | 3 refills | Status: DC

## 2022-01-18 NOTE — Patient Instructions (Signed)
Medication Instructions:  RESTART Metoprolol Succinate 12.5 mg daily  *If you need a refill on your cardiac medications before your next appointment, please call your pharmacy*  Lab Work: NONE ordered at this time of appointment   If you have labs (blood work) drawn today and your tests are completely normal, you will receive your results only by: MyChart Message (if you have MyChart) OR A paper copy in the mail If you have any lab test that is abnormal or we need to change your treatment, we will call you to review the results.  Testing/Procedures: Your physician has requested that you have an echocardiogram. Echocardiography is a painless test that uses sound waves to create images of your heart. It provides your doctor with information about the size and shape of your heart and how well your heart's chambers and valves are working. This procedure takes approximately one hour. There are no restrictions for this procedure.  Please schedule for [place] office   Follow-Up: At [facility], you and your health needs are our priority.  As part of our continuing mission to provide you with exceptional heart care, we have created designated Provider Care Teams.  These Care Teams include your primary Cardiologist (physician) and Advanced Practice Providers (APPs -  Physician Assistants and Nurse Practitioners) who all work together to provide you with the care you need, when you need it.   Your next appointment:   10-14 day(s)  The format for your next appointment:   In Person  Provider:   Thomasene Ripple, DO     Other Instructions If you have worsen chest pain, feeling of passing out and/or worsen shortness of breathe please go to the nearest Emergency Department.   Important Information About Sugar

## 2022-01-18 NOTE — Progress Notes (Unsigned)
Cardiology Office Note:    Date:  01/19/2022   ID:  Herbert Evans, DOB 26-Jun-1945, MRN [id number]  PCP:  Erskine Emery, NP   [facility] Providers Cardiologist:  Thomasene Ripple, DO     Referring MD: Erskine Emery, NP   Chief Complaint  Patient presents with   Follow-up    Seen for Dr. Servando Salina    History of Present Illness:    Herbert Evans is a 76 y.o. 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  He is intolerant to statins and unable to afford PCSK9 inhibitors.  He was started on pitavastatin and Zetia.  Card catheterization in 2018 was normal.  Echocardiogram obtained in August 2021 and also June 2022 showed a normal EF.  Echocardiogram obtained on 10/15/2020 showed EF 55 to 60%, grade 1 DD, RVSP 37.3 mmHg, mild to moderate MR, trivial AI.  Previous heart monitor showed episodes of NSVT and occasional PVCs.  Beta-blocker was increased, and he was eventually started on amiodarone therapy.  He had a nuclear stress test obtained at [facility] in 2022 that showed no evidence of ischemia.  He was last seen by Dr. Servando Salina on 09/19/2021 at which time he was doing well.  He called later in May complaining of low heart rate.  He had a episode where he was limited laying in bed and began to feel hot and clammy.  Heart rate was 39, blood pressure was 164/99.  His heart rate eventually went back up to the 40s and he felt better.  Beta-blocker was held.  Patient was ultimately supposed to follow-up afterward, however did not do so as symptom went away.  He was in his usual state of health until this past weekend when he started having fatigue, weakness, palpitation, left-sided chest pain and sweating episodes.  According to his wife, he laid in bed for the entire weekend.  He saw his PCP 2 days ago who drew some blood work and was told his renal function went down.  However they do not know the specific result.  I  attempted to call his PCPs office to obtain record, however they closed early on Friday.  Patient's vital sign appears to be stable during today's visit.  However EKG shows wide complex tachycardia with heart rate of 116 bpm, underlying right bundle branch block and left anterior fascicular block.  It is unclear to me if the patient is currently in atrial tachycardia versus 2-1 heart block.  I reviewed the EKG and that the patient's case with DOD Dr. Royann Shivers, he is not sure about the underlying rhythm either.  Eventually we agreed to restart the patient on the previous low-dose of 12.5 mg daily of metoprolol succinate, hopefully with slowing down the heart rate, the underlying rhythm will become more apparent.  We will obtain echocardiogram.  He will need blood work is actually TSH.  Unfortunately patient does not know the result of his blood work from 2 days ago other than his kidney function was down.  He was supposed to return in 5 days for repeat blood work.  I asked the patient's wife to see if they can get a copy of the blood work and see if PCPs office can also check TSH as well.  We will obtain echocardiogram, this will allow [place] to know if ejection fraction is stable or if there is any large pericardial effusion.  Patient will  return to see Dr. Servando Salina in 1 week, if the patient's heart rate is still fast, may need to refer to EP service.  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    DOE (dyspnea on exertion)    History of colon cancer    03-01-2005   s/p  sigmoid colectomy w/ negative nodes  (07-27-2019  per pt last colonoscopy 2019 , no recurrence)   History of Helicobacter pylori infection    09/ 2006   History of kidney stones    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   Hypertension    Mixed hyperlipidemia    Paroxysmal ventricular tachycardia [facility])     cardiologist--- dr Desma Maxim    (07-27-2019  per pt no issues or s&s since 2018)  takes BB   Pneumonia    RBBB (right bundle branch block)    Right ureteral stone    Type 2 diabetes mellitus (HCC)    followed by pcp --- (07-27-2019  checks blood sugar three times per week in am,  fasting sugar-- 125-160)   Wears dentures    upper    Past Surgical History:  Procedure Laterality Date   CARDIAC CATHETERIZATION  02-09-2002   dr Sharyn Lull  @[facility]    ef 50-55% w/ 10-15% LAD   CARDIAC CATHETERIZATION  06-28-2016   dr 06-30-2016 @[facility]    for arrhythmia)--- normal coronaries, mild focal wall motion abnormallity of distal inferapical wall,  severe right innominate / subclavin tortuosity   CATARACT EXTRACTION W/ INTRAOCULAR LENS  IMPLANT, BILATERAL  2018   COLON RESECTION SIGMOID  03-01-2005   @[facility]    CYSTOSCOPY WITH RETROGRADE PYELOGRAM, URETEROSCOPY AND STENT PLACEMENT Right 08/03/2019   Procedure: CYSTOSCOPY WITH RIGHT  RETROGRADE PYELOGRAM, URETEROSCOPY HOLMIUMM LASER AND STENT PLACEMENT;  Surgeon: 03-03-2005, MD;  Location: [facility];  Service: Urology;  Laterality: Right;   CYSTOSCOPY/RETROGRADE/URETEROSCOPY/STONE EXTRACTION WITH BASKET  1970s   LUNG REMOVAL, PARTIAL Left 1967   lower lobe for pneumonia per pt   TONSILLECTOMY AND ADENOIDECTOMY  child    Current Medications: Current Meds  Medication Sig   albuterol (ACCUNEB) 0.63 MG/3ML nebulizer solution Take 1 ampule by nebulization every 6 (six) hours as needed for wheezing.   amiodarone (PACERONE) 200 MG tablet TAKE 1 TABLET BY MOUTH DAILY.   aspirin EC 81 MG tablet Take 81 mg by mouth every morning.   ezetimibe (ZETIA) 10 MG tablet Take 1 tablet by mouth once daily   FARXIGA 10 MG TABS tablet Take 10 mg by mouth daily.   glipiZIDE (GLUCOTROL XL) 2.5 MG 24 hr tablet Take 2.5 mg by mouth daily.   memantine (NAMENDA) 5 MG tablet Take 5 mg by mouth 2 (two) times daily.   metFORMIN (GLUCOPHAGE) 1000 MG tablet Take 1,000 mg by mouth  2 (two) times daily with a meal.    montelukast (SINGULAIR) 10 MG tablet Take 10 mg by mouth daily.   OXYGEN Inhale 2 L into the lungs. At night   traZODone (DESYREL) 100 MG tablet Take 100 mg by mouth at bedtime.   [DISCONTINUED] metoprolol succinate (TOPROL XL) 25 MG 24 hr tablet Take 0.5 tablets (12.5 mg total) by mouth daily.     Allergies:   Atorvastatin and Rosuvastatin   Social History   Socioeconomic History  Marital status: Married    Spouse name: Not on file   Number of children: Not on file   Years of education: Not on file   Highest education level: Not on file  Occupational History   Not on file  Tobacco Use   Smoking status: Former    Types: Cigars   Smokeless tobacco: Current    Types: Snuff, Chew   Tobacco comments:    01/26/20 dips still last cigar was in 1976  Substance and Sexual Activity   Alcohol use: No   Drug use: Never   Sexual activity: Not on file  Other Topics Concern   Not on file  Social History Narrative   Not on file   Social Determinants of Health   Financial Resource Strain: Not on file  Food Insecurity: Not on file  Transportation Needs: Not on file  Physical Activity: Not on file  Stress: Not on file  Social Connections: Not on file     Family History: The patient's family history includes Asthma in his mother; Heart attack in his mother; Lung cancer in his sister, sister, and sister; Stroke in his father.  ROS:   Please see the history of present illness.     All other systems reviewed and are negative.  EKGs/Labs/Other Studies Reviewed:    The following studies were reviewed today:  Echo 10/15/2020  1. Left ventricular ejection fraction, by estimation, is 55 to 60%. The  left ventricle has normal function. The left ventricle has no regional  wall motion abnormalities. Left ventricular diastolic parameters are  consistent with Grade I diastolic  dysfunction (impaired relaxation).   2. Right ventricular systolic function  is normal. The right ventricular  size is mildly enlarged. There is mildly elevated pulmonary artery  systolic pressure. The estimated right ventricular systolic pressure is  37.3 mmHg.   3. Left atrial size was mildly dilated.   4. The mitral valve is grossly normal. Mild to possibly moderate,  eccentric mitral valve regurgitation.   5. The aortic valve is tricuspid. There is mild calcification of the  aortic valve. Aortic valve regurgitation is trivial.   6. The inferior vena cava is normal in size with greater than 50%  respiratory variability, suggesting right atrial pressure of 3 mmHg.  EKG:  EKG is ordered today.  The ekg ordered today demonstrates wide complex rhythm, unclear if atrial tachycardia versus 2-1 heart block, underlying right bundle branch block and left posterior fascicular block.  Heart rate is 116 bpm.  Recent Labs: 04/09/2021: BUN 17; Creatinine, Ser 1.08; Magnesium 2.1; Potassium 4.7; Sodium 138  Recent Lipid Panel    Component Value Date/Time   CHOL 232 (H) 12/11/2020 0929   TRIG 116 12/11/2020 0929   HDL 66 12/11/2020 0929   CHOLHDL 3.5 12/11/2020 0929   LDLCALC 146 (H) 12/11/2020 0929     Risk Assessment/Calculations:           Physical Exam:    VS:  BP 124/76   Pulse (!) 116   Ht 5\' 11"  (1.803 m)   Wt 194 lb 6.4 oz (88.2 kg)   SpO2 96%   BMI 27.11 kg/m         Wt Readings from Last 3 Encounters:  01/18/22 194 lb 6.4 oz (88.2 kg)  09/19/21 201 lb 12.8 oz (91.5 kg)  04/09/21 205 lb 3.2 oz (93.1 kg)     GEN:  Well nourished, well developed in no acute distress HEENT: Normal NECK: No JVD; No  carotid bruits LYMPHATICS: No lymphadenopathy CARDIAC: RRR, no murmurs, rubs, gallops RESPIRATORY:  Clear to auscultation without rales, wheezing or rhonchi  ABDOMEN: Soft, non-tender, non-distended MUSCULOSKELETAL:  No edema; No deformity  SKIN: Warm and dry NEUROLOGIC:  Alert and oriented x 3 PSYCHIATRIC:  Normal affect   ASSESSMENT:    1.  Dyspnea, unspecified type   2. Palpitations   3. Hyperlipidemia LDL goal <70   4. NSVT (nonsustained ventricular tachycardia) (HCC)   5. Primary hypertension   6. Controlled type 2 diabetes mellitus without complication, without long-term current use of insulin (HCC)    PLAN:    In order of problems listed above:  Dyspnea: Patient appears to be in unspecified tachycardiac rhythm.  I reviewed her rhythm strip and EKG with DOD Dr. Royann Shivers, we are not entirely clear if she is in atrial tachycardia versus 2-1 atrial flutter.  My concern is if he is in atrial tachycardia, then it may be a physiological response to other underlying illnesses.  The onset of the symptoms was last weekend.  Symptom is accompanied by fatigue and shortness of breath.  If the rhythm is truly due to atrial flutter, then it may explain his symptoms in the past week.  Dr. Royann Shivers recommended restart the previous low-dose metoprolol succinate at 12.5 mg daily.  Hopefully this will slow his heart rate down enough for [place] to determine the underlying rhythm.  -We wanted to obtain blood work including CMP, CBC, and a TSH, however patient says he just had blood work at PCPs office 2 days ago.  Unfortunately I do not have any record on hand.  I attempted to call the PCPs office, however they closed early on Friday.  Our staff will request record on Monday.  Patient was told his renal function is down, however no medication adjustment was made.  He says he is due to repeat blood work in 5 days.  I recommended add TSH if he did not have it in the past few month.  Palpitation: See #1, unclear if the underlying rhythm is atrial tachycardia or 2-1 atrial flutter.  Plan to start the patient back on metoprolol succinate 12.5 mg daily and repeat EKG on the next visit.  We will also obtain echocardiogram as well.  Hyperlipidemia: Patient reports he has not been taking the pitavastatin, we will need to reassess on follow-up.  History of NSVT: On  amiodarone 200 mg daily.  Patient has been compliant with amiodarone therapy.  Hypertension: Blood pressure stable  DM2: Managed by primary care provider.            Medication Adjustments/Labs and Tests Ordered: Current medicines are reviewed at length with the patient today.  Concerns regarding medicines are outlined above.  Orders Placed This Encounter  Procedures   EKG 12-Lead   ECHOCARDIOGRAM COMPLETE   Meds ordered this encounter  Medications   metoprolol succinate (TOPROL XL) 25 MG 24 hr tablet    Sig: Take 0.5 tablets (12.5 mg total) by mouth daily.    Dispense:  45 tablet    Refill:  3    Patient Instructions  Medication Instructions:  RESTART Metoprolol Succinate 12.5 mg daily  *If you need a refill on your cardiac medications before your next appointment, please call your pharmacy*  Lab Work: NONE ordered at this time of appointment   If you have labs (blood work) drawn today and your tests are completely normal, you will receive your results only by: MyChart Message (if you have  MyChart) OR A paper copy in the mail If you have any lab test that is abnormal or we need to change your treatment, we will call you to review the results.  Testing/Procedures: Your physician has requested that you have an echocardiogram. Echocardiography is a painless test that uses sound waves to create images of your heart. It provides your doctor with information about the size and shape of your heart and how well your heart's chambers and valves are working. This procedure takes approximately one hour. There are no restrictions for this procedure.  Please schedule for [place] office   Follow-Up: At [facility], you and your health needs are our priority.  As part of our continuing mission to provide you with exceptional heart care, we have created designated Provider Care Teams.  These Care Teams include your primary Cardiologist (physician) and Advanced Practice  Providers (APPs -  Physician Assistants and Nurse Practitioners) who all work together to provide you with the care you need, when you need it.   Your next appointment:   10-14 day(s)  The format for your next appointment:   In Person  Provider:   Berniece Salines, DO     Other Instructions If you have worsen chest pain, feeling of passing out and/or worsen shortness of breathe please go to the nearest Emergency Department.   Important Information About Sugar         Hilbert Corrigan, [place]  01/19/2022 8:46 PM    [place]

## 2022-01-19 ENCOUNTER — Encounter: Payer: Self-pay | Admitting: Physician Assistant

## 2022-01-21 ENCOUNTER — Emergency Department (HOSPITAL_BASED_OUTPATIENT_CLINIC_OR_DEPARTMENT_OTHER): Payer: Medicare Other

## 2022-01-21 ENCOUNTER — Other Ambulatory Visit: Payer: Self-pay

## 2022-01-21 ENCOUNTER — Encounter (HOSPITAL_COMMUNITY): Payer: Self-pay | Admitting: Internal Medicine

## 2022-01-21 ENCOUNTER — Encounter (HOSPITAL_COMMUNITY): Payer: Self-pay

## 2022-01-21 ENCOUNTER — Telehealth: Payer: Self-pay | Admitting: Cardiology

## 2022-01-21 ENCOUNTER — Observation Stay (HOSPITAL_BASED_OUTPATIENT_CLINIC_OR_DEPARTMENT_OTHER)
Admission: EM | Admit: 2022-01-21 | Discharge: 2022-01-23 | Disposition: A | Discharge status: Home / Self Care | Payer: Medicare Other | Attending: Internal Medicine | Admitting: Internal Medicine

## 2022-01-21 ENCOUNTER — Emergency Department (HOSPITAL_COMMUNITY)
Admission: EM | Admit: 2022-01-21 | Discharge: 2022-01-21 | Discharge status: Against Medical Advice | Payer: Medicare Other | Source: Home / Self Care

## 2022-01-21 DIAGNOSIS — Z9889 Other specified postprocedural states: Secondary | ICD-10-CM | POA: Diagnosis not present

## 2022-01-21 DIAGNOSIS — R002 Palpitations: Secondary | ICD-10-CM | POA: Insufficient documentation

## 2022-01-21 DIAGNOSIS — U071 COVID-19: Secondary | ICD-10-CM | POA: Diagnosis not present

## 2022-01-21 DIAGNOSIS — Z79899 Other long term (current) drug therapy: Secondary | ICD-10-CM | POA: Insufficient documentation

## 2022-01-21 DIAGNOSIS — J1282 Pneumonia due to coronavirus disease 2019: Secondary | ICD-10-CM | POA: Diagnosis not present

## 2022-01-21 DIAGNOSIS — E119 Type 2 diabetes mellitus without complications: Secondary | ICD-10-CM | POA: Diagnosis not present

## 2022-01-21 DIAGNOSIS — Z7984 Long term (current) use of oral hypoglycemic drugs: Secondary | ICD-10-CM | POA: Insufficient documentation

## 2022-01-21 DIAGNOSIS — Z87891 Personal history of nicotine dependence: Secondary | ICD-10-CM | POA: Insufficient documentation

## 2022-01-21 DIAGNOSIS — Z7982 Long term (current) use of aspirin: Secondary | ICD-10-CM | POA: Diagnosis not present

## 2022-01-21 DIAGNOSIS — E785 Hyperlipidemia, unspecified: Secondary | ICD-10-CM | POA: Diagnosis not present

## 2022-01-21 DIAGNOSIS — R2689 Other abnormalities of gait and mobility: Secondary | ICD-10-CM | POA: Insufficient documentation

## 2022-01-21 DIAGNOSIS — I110 Hypertensive heart disease with heart failure: Secondary | ICD-10-CM | POA: Diagnosis not present

## 2022-01-21 DIAGNOSIS — M6281 Muscle weakness (generalized): Secondary | ICD-10-CM | POA: Diagnosis not present

## 2022-01-21 DIAGNOSIS — I10 Essential (primary) hypertension: Secondary | ICD-10-CM | POA: Diagnosis present

## 2022-01-21 DIAGNOSIS — E118 Type 2 diabetes mellitus with unspecified complications: Secondary | ICD-10-CM | POA: Diagnosis present

## 2022-01-21 DIAGNOSIS — Z85038 Personal history of other malignant neoplasm of large intestine: Secondary | ICD-10-CM | POA: Insufficient documentation

## 2022-01-21 DIAGNOSIS — R000 Tachycardia, unspecified: Secondary | ICD-10-CM | POA: Insufficient documentation

## 2022-01-21 DIAGNOSIS — R0602 Shortness of breath: Secondary | ICD-10-CM | POA: Diagnosis not present

## 2022-01-21 DIAGNOSIS — I4891 Unspecified atrial fibrillation: Principal | ICD-10-CM | POA: Diagnosis present

## 2022-01-21 DIAGNOSIS — Z5321 Procedure and treatment not carried out due to patient leaving prior to being seen by health care provider: Secondary | ICD-10-CM | POA: Insufficient documentation

## 2022-01-21 DIAGNOSIS — I5041 Acute combined systolic (congestive) and diastolic (congestive) heart failure: Secondary | ICD-10-CM | POA: Diagnosis not present

## 2022-01-21 DIAGNOSIS — I484 Atypical atrial flutter: Secondary | ICD-10-CM

## 2022-01-21 DIAGNOSIS — I4729 Other ventricular tachycardia: Secondary | ICD-10-CM | POA: Diagnosis present

## 2022-01-21 DIAGNOSIS — Z902 Acquired absence of lung [part of]: Secondary | ICD-10-CM | POA: Diagnosis not present

## 2022-01-21 DIAGNOSIS — R0789 Other chest pain: Secondary | ICD-10-CM | POA: Insufficient documentation

## 2022-01-21 DIAGNOSIS — J189 Pneumonia, unspecified organism: Secondary | ICD-10-CM

## 2022-01-21 HISTORY — DX: Supraventricular tachycardia: I47.1

## 2022-01-21 HISTORY — DX: Ventricular premature depolarization: I49.3

## 2022-01-21 HISTORY — DX: Other ventricular tachycardia: I47.29

## 2022-01-21 HISTORY — DX: Other supraventricular tachycardia: I47.19

## 2022-01-21 LAB — BASIC METABOLIC PANEL
Anion gap: 11 (ref 5–15)
BUN: 11 mg/dL (ref 8–23)
CO2: 23 mmol/L (ref 22–32)
Calcium: 8.9 mg/dL (ref 8.9–10.3)
Chloride: 102 mmol/L (ref 98–111)
Creatinine, Ser: 0.93 mg/dL (ref 0.61–1.24)
GFR, Estimated: >60 mL/min (ref >60)
Glucose, Bld: 130 mg/dL — ABNORMAL HIGH (ref 70–99)
Potassium: 4.2 mmol/L (ref 3.5–5.1)
Sodium: 136 mmol/L (ref 135–145)

## 2022-01-21 LAB — MAGNESIUM: Magnesium: 1.8 mg/dL (ref 1.7–2.4)

## 2022-01-21 LAB — CBC WITH DIFFERENTIAL/PLATELET
Abs Immature Granulocytes: 0.03 10*3/uL (ref 0.00–0.07)
Basophils Absolute: 0 10*3/uL (ref 0.0–0.1)
Basophils Relative: 1 %
Eosinophils Absolute: 0.1 10*3/uL (ref 0.0–0.5)
Eosinophils Relative: 1 %
HCT: 46.2 % (ref 39.0–52.0)
Hemoglobin: 15.7 g/dL (ref 13.0–17.0)
Immature Granulocytes: 0 %
Lymphocytes Relative: 26 %
Lymphs Abs: 1.8 10*3/uL (ref 0.7–4.0)
MCH: 31 pg (ref 26.0–34.0)
MCHC: 34 g/dL (ref 30.0–36.0)
MCV: 91.3 fL (ref 80.0–100.0)
Monocytes Absolute: 0.4 10*3/uL (ref 0.1–1.0)
Monocytes Relative: 6 %
Neutro Abs: 4.4 10*3/uL (ref 1.7–7.7)
Neutrophils Relative %: 66 %
Platelets: 154 10*3/uL (ref 150–400)
RBC: 5.06 MIL/uL (ref 4.22–5.81)
RDW: 13.8 % (ref 11.5–15.5)
WBC: 6.7 10*3/uL (ref 4.0–10.5)
nRBC: 0 % (ref 0.0–0.2)

## 2022-01-21 LAB — SARS CORONAVIRUS 2 BY RT PCR: SARS Coronavirus 2 by RT PCR: POSITIVE — AB

## 2022-01-21 LAB — TSH: TSH: 2.097 u[IU]/mL (ref 0.350–4.500)

## 2022-01-21 LAB — TROPONIN I (HIGH SENSITIVITY)
Troponin I (High Sensitivity): 10 ng/L (ref <18)
Troponin I (High Sensitivity): 9 ng/L (ref <18)

## 2022-01-21 LAB — GLUCOSE, CAPILLARY: Glucose-Capillary: 135 mg/dL — ABNORMAL HIGH (ref 70–99)

## 2022-01-21 MED ORDER — EZETIMIBE 10 MG PO TABS
10.0000 mg | ORAL_TABLET | Freq: Every day | ORAL | Status: DC
  Administered 2022-01-22 – 2022-01-23 (×2): 10 mg via ORAL
  Filled 2022-01-21 (×2): qty 1

## 2022-01-21 MED ORDER — APIXABAN 5 MG PO TABS
5.0000 mg | ORAL_TABLET | Freq: Two times a day (BID) | ORAL | Status: DC
  Administered 2022-01-21 – 2022-01-23 (×4): 5 mg via ORAL
  Filled 2022-01-21 (×4): qty 1

## 2022-01-21 MED ORDER — ACETAMINOPHEN 325 MG PO TABS: 650.0000 mg | ORAL_TABLET | Freq: Four times a day (QID) | ORAL | Status: DC | PRN

## 2022-01-21 MED ORDER — ALBUTEROL SULFATE 0.63 MG/3ML IN NEBU: 1.0000 | INHALATION_SOLUTION | Freq: Four times a day (QID) | RESPIRATORY_TRACT | Status: DC | PRN

## 2022-01-21 MED ORDER — METOPROLOL SUCCINATE ER 25 MG PO TB24
12.5000 mg | ORAL_TABLET | Freq: Every day | ORAL | Status: DC
  Administered 2022-01-22: 12.5 mg via ORAL
  Filled 2022-01-21: qty 1

## 2022-01-21 MED ORDER — SODIUM CHLORIDE 0.9 % IV SOLN
500.0000 mg | Freq: Once | INTRAVENOUS | Status: AC
  Administered 2022-01-21: 500 mg via INTRAVENOUS
  Filled 2022-01-21: qty 5

## 2022-01-21 MED ORDER — MEMANTINE HCL 10 MG PO TABS
5.0000 mg | ORAL_TABLET | Freq: Two times a day (BID) | ORAL | Status: DC
  Administered 2022-01-21 – 2022-01-23 (×4): 5 mg via ORAL
  Filled 2022-01-21 (×4): qty 1

## 2022-01-21 MED ORDER — ALBUTEROL SULFATE 0.63 MG/3ML IN NEBU: 0.6300 mg | INHALATION_SOLUTION | Freq: Four times a day (QID) | RESPIRATORY_TRACT | Status: DC | PRN

## 2022-01-21 MED ORDER — DILTIAZEM LOAD VIA INFUSION
10.0000 mg | Freq: Once | INTRAVENOUS | Status: AC
  Administered 2022-01-21: 10 mg via INTRAVENOUS
  Filled 2022-01-21: qty 10

## 2022-01-21 MED ORDER — ACETAMINOPHEN 650 MG RE SUPP: 650.0000 mg | Freq: Four times a day (QID) | RECTAL | Status: DC | PRN

## 2022-01-21 MED ORDER — SODIUM CHLORIDE 0.9% FLUSH: 3.0000 mL | Freq: Two times a day (BID) | INTRAVENOUS | Status: DC

## 2022-01-21 MED ORDER — INSULIN ASPART 100 UNIT/ML IJ SOLN
0.0000 [IU] | Freq: Three times a day (TID) | INTRAMUSCULAR | Status: DC
  Administered 2022-01-22: 2 [IU] via SUBCUTANEOUS
  Administered 2022-01-22: 1 [IU] via SUBCUTANEOUS
  Administered 2022-01-23: 5 [IU] via SUBCUTANEOUS

## 2022-01-21 MED ORDER — AMIODARONE HCL 200 MG PO TABS
200.0000 mg | ORAL_TABLET | Freq: Every day | ORAL | Status: DC
  Administered 2022-01-22 – 2022-01-23 (×2): 200 mg via ORAL
  Filled 2022-01-21 (×2): qty 1

## 2022-01-21 MED ORDER — ALBUTEROL SULFATE (2.5 MG/3ML) 0.083% IN NEBU: 0.6300 mg | INHALATION_SOLUTION | Freq: Four times a day (QID) | RESPIRATORY_TRACT | Status: DC | PRN

## 2022-01-21 MED ORDER — TRAZODONE HCL 100 MG PO TABS
100.0000 mg | ORAL_TABLET | Freq: Every day | ORAL | Status: DC
  Administered 2022-01-21 – 2022-01-22 (×2): 100 mg via ORAL
  Filled 2022-01-21 (×2): qty 1

## 2022-01-21 MED ORDER — POLYETHYLENE GLYCOL 3350 17 G PO PACK: 17.0000 g | PACK | Freq: Every day | ORAL | Status: DC | PRN

## 2022-01-21 MED ORDER — DILTIAZEM HCL-DEXTROSE 125-5 MG/125ML-% IV SOLN (PREMIX)
5.0000 mg/h | INTRAVENOUS | Status: DC
  Administered 2022-01-21: 10 mg/h via INTRAVENOUS
  Administered 2022-01-21: 5 mg/h via INTRAVENOUS
  Administered 2022-01-22 (×2): 10 mg/h via INTRAVENOUS
  Filled 2022-01-21 (×5): qty 125

## 2022-01-21 MED ORDER — SODIUM CHLORIDE 0.9 % IV SOLN
1.0000 g | Freq: Once | INTRAVENOUS | Status: AC
  Administered 2022-01-21: 1 g via INTRAVENOUS
  Filled 2022-01-21: qty 10

## 2022-01-21 NOTE — H&P (Addendum)
History and Physical   Herbert Evans TKZ:[id number] DOB: 02/22/1946 DOA: 01/21/2022  PCP: Erskine Emery, NP   Patient coming from: Home  Chief Complaint: Shortness of breath, weakness  HPI: Herbert Evans is a 76 y.o. male with medical history significant of hypertension, hyperlipidemia, diabetes, colon cancer, pneumonectomy, NSVT presenting with shortness of breath and weakness.  Patient reporting 10 days of shortness of breath and increasing weakness.  Does report some associated chest pain in the left anterior chest described as a 4 out of 10 mild sharp/burning sensation.  He was seen at the cardiologist office 3 days ago noted to have tachycardia at that time which appeared to be atrial tachycardia versus atrial flutter he had his metoprolol restarted to attempt to slow down the rate to be able to further delineate the rhythm.  Plan for echocardiogram outpatient.  Due to persistent and worsening symptoms patient presented to the ED.  He denies fevers, chills, abdominal pain, constipation, diarrhea, nausea, vomiting.  ED Course: Vital signs in the ED significant for blood pressure in the 100s to 120 systolic, heart rate in the 80s to 110s.  Respiratory rate in the teens to 20s.  Lab work-up included BMP with glucose 130.  CBC within normal limits.  Troponin normal x2.  TSH normal.  Respiratory panel screening for COVID positive for COVID-19.  Chest x-ray showed status post left lower lobectomy and increased interstitial airspace opacity at the left mid chest similar to previous last year suspicious for pneumonia.  CT of the chest showed left upper lobe density suspicious for pneumonia but cannot exclude underlying neoplasm.  Also noted scarring and previous partial resection.  Patient received ceftriaxone azithromycin in the ED and was started on a dill drip improvement of heart rate.  Review of Systems: As per HPI otherwise all other systems reviewed and are negative.  Past Medical  History:  Diagnosis Date   Arthritis    back, hands   Chronic cough    07-27-2019  per pt has had dry chronic cough since 1967 due to partial left lung removal    DOE (dyspnea on exertion)    History of colon cancer    03-01-2005   s/p  sigmoid colectomy w/ negative nodes  (07-27-2019  per pt last colonoscopy 2019 , no recurrence)   History of Helicobacter pylori infection    09/ 2006   History of kidney stones    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   Hypertension    Mixed hyperlipidemia    Paroxysmal ventricular tachycardia [facility])    cardiologist--- dr Desma Maxim    (07-27-2019  per pt no issues or s&s since 2018)  takes BB   PNA (pneumonia) 10/13/2020   Pneumonia    RBBB (right bundle branch block)    Right ureteral stone    Type 2 diabetes mellitus (HCC)    followed by pcp --- (07-27-2019  checks blood sugar three times per week in am,  fasting sugar-- 125-160)   Wears dentures    upper    Past Surgical History:  Procedure Laterality Date   CARDIAC CATHETERIZATION  02-09-2002   dr Sharyn Lull  @[facility]    ef 50-55% w/ 10-15% LAD   CARDIAC CATHETERIZATION  06-28-2016   dr 06-30-2016 @[facility]    for arrhythmia)--- normal coronaries, mild focal wall motion abnormallity of distal inferapical wall,  severe right innominate / subclavin tortuosity   CATARACT EXTRACTION W/ INTRAOCULAR LENS  IMPLANT, BILATERAL  2018  COLON RESECTION SIGMOID  03-01-2005   @[facility]    CYSTOSCOPY WITH RETROGRADE PYELOGRAM, URETEROSCOPY AND STENT PLACEMENT Right 08/03/2019   Procedure: CYSTOSCOPY WITH RIGHT  RETROGRADE PYELOGRAM, URETEROSCOPY HOLMIUMM LASER AND STENT PLACEMENT;  Surgeon: 08/05/2019, MD;  Location: [facility];  Service: Urology;  Laterality: Right;   CYSTOSCOPY/RETROGRADE/URETEROSCOPY/STONE EXTRACTION WITH BASKET  1970s   LUNG REMOVAL, PARTIAL Left 1967   lower lobe for pneumonia per pt   TONSILLECTOMY AND ADENOIDECTOMY  child    Social History  reports that he has quit smoking. His  smoking use included cigars. His smokeless tobacco use includes snuff and chew. He reports that he does not drink alcohol and does not use drugs.  Allergies  Allergen Reactions   Atorvastatin Other (See Comments)    myalgias   Rosuvastatin Other (See Comments)    myalgias    Family History  Problem Relation Age of Onset   Asthma Mother    Heart attack Mother    Stroke Father    Lung cancer Sister    Lung cancer Sister    Lung cancer Sister   Reviewed on admission  Prior to Admission medications   Medication Sig Start Date End Date Taking? Authorizing Provider  albuterol (ACCUNEB) 0.63 MG/3ML nebulizer solution Take 1 ampule by nebulization every 6 (six) hours as needed for wheezing.    [provider]  amiodarone (PACERONE) 200 MG tablet TAKE 1 TABLET BY MOUTH DAILY. 11/09/21   Tobb, Kardie, DO  aspirin EC 81 MG tablet Take 81 mg by mouth every morning.    [provider]  ezetimibe (ZETIA) 10 MG tablet Take 1 tablet by mouth once daily 01/15/22   Tobb, Kardie, DO  FARXIGA 10 MG TABS tablet Take 10 mg by mouth daily. 04/02/21   [provider]  glipiZIDE (GLUCOTROL XL) 2.5 MG 24 hr tablet Take 2.5 mg by mouth daily. 01/04/22   [provider]  memantine (NAMENDA) 5 MG tablet Take 5 mg by mouth 2 (two) times daily. 11/02/19   [provider]  metFORMIN (GLUCOPHAGE) 1000 MG tablet Take 1,000 mg by mouth 2 (two) times daily with a meal.     [provider]  metoprolol succinate (TOPROL XL) 25 MG 24 hr tablet Take 0.5 tablets (12.5 mg total) by mouth daily. 01/18/22   03/20/22, PA  montelukast (SINGULAIR) 10 MG tablet Take 10 mg by mouth daily. 11/22/21   [provider]  OXYGEN Inhale 2 L into the lungs. At night    [provider]  Pitavastatin Calcium (LIVALO) 2 MG TABS Take 1 tablet (2 mg total) by mouth daily. Patient not taking: Reported on 01/18/2022 01/10/20   Tobb, 01/12/20, DO  traZODone (DESYREL) 100 MG tablet Take  100 mg by mouth at bedtime. 08/02/19   [provider]    Physical Exam: Vitals:   01/21/22 1830 01/21/22 1905 01/21/22 1913 01/21/22 2010  BP:  106/66  125/89  Pulse: 85 (!) 57  (!) 57  Resp: 20 18  18   Temp:   98 F (36.7 C) 98.3 F (36.8 C)  TempSrc:    Oral  SpO2: 99% 97%  98%  Weight:      Height:        Physical Exam Constitutional:      General: He is not in acute distress.    Appearance: Normal appearance.  HENT:     Head: Normocephalic and atraumatic.     Mouth/Throat:  Mouth: Mucous membranes are moist.     Pharynx: Oropharynx is clear.  Eyes:     Extraocular Movements: Extraocular movements intact.     Pupils: Pupils are equal, round, and reactive to light.  Cardiovascular:     Rate and Rhythm: Normal rate. Rhythm irregular.     Pulses: Normal pulses.     Heart sounds: Normal heart sounds.  Pulmonary:     Effort: Pulmonary effort is normal. No respiratory distress.     Breath sounds: Wheezing present.  Abdominal:     General: Bowel sounds are normal. There is no distension.     Palpations: Abdomen is soft.     Tenderness: There is no abdominal tenderness.  Musculoskeletal:        General: No swelling or deformity.  Skin:    General: Skin is warm and dry.  Neurological:     General: No focal deficit present.     Mental Status: Mental status is at baseline.    Labs on Admission: I have personally reviewed following labs and imaging studies  CBC: Recent Labs  Lab 01/21/22 1027  WBC 6.7  NEUTROABS 4.4  HGB 15.7  HCT 46.2  MCV 91.3  PLT 154    Basic Metabolic Panel: Recent Labs  Lab 01/21/22 1027  NA 136  K 4.2  CL 102  CO2 23  GLUCOSE 130*  BUN 11  CREATININE 0.93  CALCIUM 8.9  MG 1.8    GFR: Estimated Creatinine Clearance: 74.2 mL/min (by C-G formula based on SCr of 0.93 mg/dL).  Liver Function Tests: No results for input(s): "AST", "ALT", "ALKPHOS", "BILITOT", "PROT", "ALBUMIN" in the last 168 hours.  Urine  analysis:    Component Value Date/Time   COLORURINE YELLOW 05/12/2019 1410   APPEARANCEUR CLEAR 05/12/2019 1410   LABSPEC 1.004 (L) 05/12/2019 1410   PHURINE 6.0 05/12/2019 1410   GLUCOSEU 50 (A) 05/12/2019 1410   HGBUR LARGE (A) 05/12/2019 1410   BILIRUBINUR NEGATIVE 05/12/2019 1410   KETONESUR 5 (A) 05/12/2019 1410   PROTEINUR NEGATIVE 05/12/2019 1410   NITRITE NEGATIVE 05/12/2019 1410   LEUKOCYTESUR NEGATIVE 05/12/2019 1410    Radiological Exams on Admission: CT Chest Wo Contrast  Result Date: 01/21/2022 CLINICAL DATA:  Palpitations, intermittent shortness of breath, chest pain EXAM: CT CHEST WITHOUT CONTRAST TECHNIQUE: Multidetector CT imaging of the chest was performed following the standard protocol without IV contrast. RADIATION DOSE REDUCTION: This exam was performed according to the departmental dose-optimization program which includes automated exposure control, adjustment of the mA and/or kV according to patient size and/or use of iterative reconstruction technique. COMPARISON:  Previous CT done on 01/24/2021, chest radiographs done earlier today FINDINGS: Cardiovascular: Coronary artery calcifications are seen. There is ectasia of the main pulmonary artery measuring 3.8 cm suggesting pulmonary arterial hypertension. Mediastinum/Nodes: There is shift of mediastinum to the left suggesting decreased volume in the left lung. Subcentimeter nodes in mediastinum appear stable. Lungs/Pleura: Linear densities in right lower lung field appears stable. There is small focus of increased interstitial markings in the medial right apical region with no significant change. There is decreased volume in the left lung suggesting previous partial resection. In image 48 of series 4, there is 2 cm pleural-based alveolar density in left upper lung field which measured 11 mm in the previous study. There are other linear patchy density seen right mid and right lower lung fields. There is pleural thickening  with pleural calcifications in the posteromedial left lower lung field. There is no pneumothorax.  Upper Abdomen: No acute findings are seen. Musculoskeletal: Degenerative changes are noted in thoracic spine with extensive calcifications in the anterior spinal ligament. Please correlate for possible ankylosing spondylitis. IMPRESSION: There is 2 cm focal alveolar density in lateral aspect of left upper lung field suggesting pneumonia. Possibility of underlying neoplastic process is not excluded. Short-term follow-up CT in 3 months may be considered. There are other linear densities in left lower lung field suggesting scarring. There is decreased volume in the left lung suggesting previous partial resection. Coronary artery disease. There is ectasia of the main pulmonary artery suggesting pulmonary arterial hypertension. Electronically Signed   By: Ernie Avena M.D.   On: 01/21/2022 15:35   DG Chest Port 1 View  Result Date: 01/21/2022 CLINICAL DATA:  76 year old male presenting for evaluation of cough and dyspnea. EXAM: PORTABLE CHEST 1 VIEW COMPARISON:  CT of the chest from September of 2022 and chest x-ray of December 07, 2020 FINDINGS: Trachea midline. Cardiomediastinal contours and hilar structures are stable. 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 EKG leads project over the chest. No lobar consolidative process. Mild increased interstitial markings and suggestion of alveolar opacities developing in the LEFT mid chest. LEFT apical pleural and parenchymal scarring. W on limited assessment no acute skeletal findings. IMPRESSION: Post LEFT lower lobectomy with increasing interstitial and airspace opacities in the LEFT mid chest suspicious for pneumonia. Findings are similar to November 10, 2020. Consider PA and lateral chest radiograph on follow-up to ensure resolution. Electronically Signed   By: Donzetta Kohut  M.D.   On: 01/21/2022 14:01    EKG: Independently reviewed.  Initial EKG showed wide-complex QRS secondary to right bundle branch block at 119 bpm.    Subsequent EKG was similar rate at 118 with right bundle branch block which possible underlying A-fib.    Third EKG showed sinus versus ectopic rhythm with right bundle branch block at 117 bpm.    Fourth and final EKG in the ED showed rate of 104 bpm with right bundle branch block now appearing to be more clearly atrial fibrillation.  Assessment/Plan Principal Problem:   Atrial fibrillation with rapid ventricular response (HCC) Active Problems:   Type 2 diabetes mellitus with complication, without long-term current use of insulin (HCC)   Hyperlipidemia LDL goal <100   History of pneumonectomy   Hypertension   NSVT (nonsustained ventricular tachycardia) (HCC)   COVID-19 virus infection   Atrial fibrillation with RVR > Patient presenting with weakness and shortness of breath found to be in unclear rhythm tachycardic in the 1 teens initially.  Has really rate improved with diltiazem to be more clear that this was initially atrial fibrillation with RVR. > Had been seen in cardiology office a few days earlier and started on metoprolol for tachycardia with unclear rhythm with hope to slow down rate to make it more clear if there was underlying A-fib. > No prior history of A-fib does have prior history of NSVT and is on amiodarone for that.  Also taking metoprolol as above. > Normal TSH, magnesium, potassium, troponin at med center.  Notably does have positive COVID as below, could be trigger. > Heart rate responding to diltiazem drip in ED.  Excepted for transfer from med Center to progressive bed earlier today. - Monitor on progressive unit - Continue with diltiazem drip, wean as tolerated - Continue home metoprolol and amiodarone - Start Eliquis - Echocardiogram  COVID-19  infection > In addition to atrial fibrillation with RVR as above.   Patient noted to have COVID-19 positive result.  Also had imaging on chest x-ray and CT with possible left sided pneumonia however chest x-ray states this was similar to previous from last year and CT states cannot rule out underlying malignancy. > No leukocytosis to indicate bacterial etiology.  Patient is covered overnight with ceftriaxone and azithromycin is given in the ED either way. > If there is developing pneumonia could be secondary to this COVID-19 infection.  His symptoms may also be due to concurrent tachycardia from A-fib with RVR as above. > As symptom onset was 10 days ago outside the window for oral antivirals.  Not requiring oxygen so we will have no need for steroids. - Supportive care - Trend fever curve and WBC  History of NSVT - Continue home amiodarone may benefit from dose adjustment to treat for this and A-fib as well.  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 - Noted  DVT prophylaxis: Eliquis Code Status:   Full Family Communication:  Attempted to contact wife by phone but there was no answer Disposition Plan:   Patient is from:  Home  Anticipated DC to:  Home  Anticipated DC date:  2 days  Anticipated DC barriers: None  Consults called:  None Admission status:  Inpatient, progressive  Severity of Illness: The appropriate patient status for this patient is INPATIENT. Inpatient status is judged to be reasonable and necessary in order to provide the required intensity of service to ensure the patient's safety. The patient's presenting symptoms, physical exam findings,  and initial radiographic and laboratory data in the context of their chronic comorbidities is felt to place them at high risk for further clinical deterioration. Furthermore, it is not anticipated that the patient will be medically stable for discharge from the hospital within 2 midnights of admission.   * I certify that at the point of admission it is my clinical judgment that the patient will require inpatient hospital care spanning beyond 2 midnights from the point of admission due to high intensity of service, high risk for further deterioration and high frequency of surveillance required.Synetta Fail*   Arin Vanosdol B Latroya Ng MD [facility]  How to contact the [facility] Attending or Consulting provider 7A - 7P or covering provider during after hours 7P -7A, for this patient?   Check the care team in [facility] and look for a) attending/consulting [facility] provider listed and b) the [facility] team listed Log into [email or web address] and use 's universal password to access. If you do not have the password, please contact the hospital operator. Locate the [facility] provider you are looking for under [facility] and page to a number that you can be directly reached. If you still have difficulty reaching the provider, please page the [facility] (Director on Call) for the Hospitalists listed on amion for assistance.  01/21/2022, 9:16 PM

## 2022-01-21 NOTE — ED Notes (Signed)
Report given to the Floor RN. 

## 2022-01-21 NOTE — Telephone Encounter (Signed)
Pt called back regarding message received at [address] office.   Pt wife stated HR ran between ( 113 - 133 ) this weekend and this morning HR between 115- 120's. BP has been up and down between 153/91 to 105/71.    This morning the pt BGL was 220 at 400 am, and felt dizzy this morning, with shaky hands.  Pt is a diabetic, and ate some soup.  Pt denies chest pain, just mild dizziness.   Pt call will be forwarded to Dr Mallory Shirk RN at [facility] for follow up.  Pt stated they were told to take Toprol XL last Friday to decrease any tachycardia over the weekend.  Follow up is required.

## 2022-01-21 NOTE — ED Triage Notes (Signed)
Pt arrived POV, ambulatory. Pt c/o palpitations ongoing since seeing the cardiologist Friday. Pt began taking metoprolol with no change. Pt reports intermittent SOB, CP, diaphoresis. CP 3/4 at present, no obvious respiratory distress.

## 2022-01-21 NOTE — ED Provider Triage Note (Signed)
Emergency Medicine Provider Triage Evaluation Note  Herbert Evans , a 76 y.o. male  was evaluated in triage.  Pt complains of racing heart.  Patient states that he has been having palpitations, feeling diaphoretic, having some mild chest pain and sweating episodes.  He has associated shortness of breath.  He says he was seen by cardiology on Friday for high heart rate was started on metoprolol, but this has not helped.  Review of Systems  Positive:  Negative:   Physical Exam  BP (!) 124/95   Pulse (!) 121   Temp 98.1 F (36.7 C) (Oral)   Resp 14   Ht 5\' 11"  (1.803 m)   Wt 88 kg   SpO2 98%   BMI 27.06 kg/m  Gen:   Awake, no distress   Resp:  Normal effort  MSK:   Moves extremities without difficulty  Other:  Tachycardia, regular rate, pulses good  Medical Decision Making  Medically screening exam initiated at 10:19 AM.  Appropriate orders placed.  Herbert Evans was informed that the remainder of the evaluation will be completed by another provider, this initial triage assessment does not replace that evaluation, and the importance of remaining in the ED until their evaluation is complete.     Odis Luster, PA-C 01/21/22 1020

## 2022-01-21 NOTE — Telephone Encounter (Signed)
STAT if HR is under 50 or over 120 (normal HR is 60-100 beats per minute)  What is your heart rate?  Between 113-133 this weekend.  This morning HR was 120 BP has been running between 105/71 - 153/91  Do you have a log of your heart rate readings (document readings)?   Yes  Do you have any other symptoms? Dizziness and feeling weak.   Wife called to report patient's HR has not changed even though he has taken metoprolol succinate (TOPROL XL) 25 MG 24 hr tablet.  Wife stated patient has had some chest pains but not as bad as last Friday.

## 2022-01-21 NOTE — ED Provider Notes (Signed)
[facility] EMERGENCY DEPT Provider Note   CSN: [id number] Arrival date & time: 01/21/22  1236     History {Add pertinent medical, surgical, social history, OB history to HPI:1} Chief Complaint  Patient presents with  . Palpitations    Herbert Evans is a 76 y.o. male.  HPI 76 yo male presents today complaining of increasing weakness over 10 days and dyspnea.  Patient with some chest pain, left anterior chest discomfort, unable to tell what causes it- mild discomfort at presents, describes as sharp burning pain,like bee stinging and burning, currently at 4/10. Last gone during sleep.  Cough at baseline, no fever, appetite ok.  Seen at cardiologist office on Friday and metoprolol restarted after d/c'ing in May due to bradycardia. Reviewed cardiology note from 01/19/22-cardiac cath 2018 normal. Echo 2021 and June 2022 with normal ef, heart monitor with episodes of nsvt and occasional pvc.  Patient seen by pmd, Dr. Elyn Peers on Wednesday.  Labs drawn but unclear what resulted.      Home Medications Prior to Admission medications   Medication Sig Start Date End Date Taking? Authorizing Provider  albuterol (ACCUNEB) 0.63 MG/3ML nebulizer solution Take 1 ampule by nebulization every 6 (six) hours as needed for wheezing.    [provider]  amiodarone (PACERONE) 200 MG tablet TAKE 1 TABLET BY MOUTH DAILY. 11/09/21   Tobb, Kardie, DO  aspirin EC 81 MG tablet Take 81 mg by mouth every morning.    [provider]  ezetimibe (ZETIA) 10 MG tablet Take 1 tablet by mouth once daily 01/15/22   Tobb, Kardie, DO  FARXIGA 10 MG TABS tablet Take 10 mg by mouth daily. 04/02/21   [provider]  glipiZIDE (GLUCOTROL XL) 2.5 MG 24 hr tablet Take 2.5 mg by mouth daily. 01/04/22   [provider]  memantine (NAMENDA) 5 MG tablet Take 5 mg by mouth 2 (two) times daily. 11/02/19   [provider]  metFORMIN (GLUCOPHAGE) 1000 MG tablet Take 1,000 mg by mouth 2  (two) times daily with a meal.     [provider]  metoprolol succinate (TOPROL XL) 25 MG 24 hr tablet Take 0.5 tablets (12.5 mg total) by mouth daily. 01/18/22   Azalee Course, PA  montelukast (SINGULAIR) 10 MG tablet Take 10 mg by mouth daily. 11/22/21   [provider]  OXYGEN Inhale 2 L into the lungs. At night    [provider]  Pitavastatin Calcium (LIVALO) 2 MG TABS Take 1 tablet (2 mg total) by mouth daily. Patient not taking: Reported on 01/18/2022 01/10/20   Tobb, Lavona [place], DO  traZODone (DESYREL) 100 MG tablet Take 100 mg by mouth at bedtime. 08/02/19   [provider]      Allergies    Atorvastatin and Rosuvastatin    Review of Systems   Review of Systems  Physical Exam Updated Vital Signs BP 125/73   Pulse 84   Temp 97.8 F (36.6 C)   Resp 15   Ht 1.803 m (5\' 11" )   Wt 88 kg   SpO2 99%   BMI 27.06 kg/m  Physical Exam Vitals and nursing note reviewed.  HENT:     Head: Normocephalic.     Right Ear: External ear normal.     Left Ear: External ear normal.     Nose: Nose normal.     Mouth/Throat:     Mouth: Mucous membranes are moist.  Eyes:     Pupils: Pupils are equal, round, and  reactive to light.  Cardiovascular:     Rate and Rhythm: Normal rate. Rhythm irregular.     Pulses: Normal pulses.  Pulmonary:     Effort: Pulmonary effort is normal.     Breath sounds: Examination of the left-upper field reveals decreased breath sounds. Examination of the left-middle field reveals decreased breath sounds. Examination of the left-lower field reveals decreased breath sounds. Decreased breath sounds present.  Abdominal:     General: Abdomen is flat. Bowel sounds are normal.     Palpations: Abdomen is soft.  Musculoskeletal:        General: Normal range of motion.     Cervical back: Normal range of motion.  Skin:    General: Skin is warm.     Capillary Refill: Capillary refill takes less than 2 seconds.  Neurological:     General: No focal  deficit present.     Mental Status: He is alert.  Psychiatric:        Mood and Affect: Mood normal.    ED Results / Procedures / Treatments   Labs (all labs ordered are listed, but only abnormal results are displayed) Labs Reviewed  SARS CORONAVIRUS 2 BY RT PCR - Abnormal; Notable for the following components:      Result Value   SARS Coronavirus 2 by RT PCR POSITIVE (*)    All other components within normal limits  TROPONIN I (HIGH SENSITIVITY)    EKG EKG Interpretation  Date/Time:  Monday January 21 2022 14:13:55 EDT Ventricular Rate:  104 PR Interval:    QRS Duration: 160 QT Interval:  394 QTC Calculation: 519 R Axis:   128 Text Interpretation: Atrial fibrillation RBBB and LPFB Confirmed by Margarita Grizzle [phone]) on 01/21/2022 2:33:23 PM  Radiology DG Chest Port 1 View  Result Date: 01/21/2022 CLINICAL DATA:  76 year old male presenting for evaluation of cough and dyspnea. EXAM: PORTABLE CHEST 1 VIEW COMPARISON:  CT of the chest from September of 2022 and chest x-Lyndzee Kliebert of December 07, 2020 FINDINGS: Trachea midline. Cardiomediastinal contours and hilar structures are stable. 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 EKG leads project over the chest. No lobar consolidative process. Mild increased interstitial markings and suggestion of alveolar opacities developing in the LEFT mid chest. LEFT apical pleural and parenchymal scarring. W on limited assessment no acute skeletal findings. IMPRESSION: Post LEFT lower lobectomy with increasing interstitial and airspace opacities in the LEFT mid chest suspicious for pneumonia. Findings are similar to November 10, 2020. Consider PA and lateral chest radiograph on follow-up to ensure resolution. Electronically Signed   By: Donzetta Kohut M.D.   On: 01/21/2022 14:01    Procedures Procedures  {Document cardiac monitor, telemetry assessment procedure when  appropriate:1}  Medications Ordered in ED Medications  diltiazem (CARDIZEM) 1 mg/mL load via infusion 10 mg (10 mg Intravenous Bolus from Bag 01/21/22 1401)    And  diltiazem (CARDIZEM) 125 mg in dextrose 5% 125 mL (1 mg/mL) infusion (10 mg/hr Intravenous Rate/Dose Change 01/21/22 1419)  cefTRIAXone (ROCEPHIN) 1 g in sodium chloride 0.9 % 100 mL IVPB (has no administration in time range)  azithromycin (ZITHROMAX) 500 mg in sodium chloride 0.9 % 250 mL IVPB (has no administration in time range)    ED Course/ Medical Decision Making/ A&P  Medical Decision Making 76 year old male presents today complaining of increased weakness and dyspnea On evaluation patient found to be in new onset A-fib. Labs reviewed from earlier same day within normal limits X-Adelle Zachar reviewed and suspicious for increased interstitial opacities in left chest 1 new's onset A-fib patient treated here with Cardizem and heart rate decreased to 90s patient on Cardizem drip 2 COVID-positive 3 infiltrate on chest x-Pama Roskos patient given Rocephin and Zithromax Plan consultation for admission for ongoing evaluation and treatment Discussed with Dr. Joylene Igo, on-call for hospitalist.  She advises CT and she will follow-up CT.  She is excepted patient to progressive bed at [facility]   Amount and/or Complexity of Data Reviewed Independent Historian: spouse    Details: Wife is at bedside and gives additional history External Data Reviewed: radiology and ECG. Labs: ordered. Decision-making details documented in ED Course. Radiology: ordered and independent interpretation performed. Decision-making details documented in ED Course.  Risk Prescription drug management. Parenteral controlled substances. Decision regarding hospitalization.  Critical Care Total time providing critical care: 60 minutes    {Document critical care time when appropriate:1} {Document review of labs and clinical decision tools ie  heart score, Chads2Vasc2 etc:1}  {Document your independent review of radiology images, and any outside records:1} {Document your discussion with family members, caretakers, and with consultants:1} {Document social determinants of health affecting pt's care:1} {Document your decision making why or why not admission, treatments were needed:1} Final Clinical Impression(s) / ED Diagnoses Final diagnoses:  Atrial fibrillation, unspecified type (HCC)  COVID  Pneumonia of left lung due to infectious organism, unspecified part of lung    Rx / DC Orders ED Discharge Orders     None

## 2022-01-21 NOTE — ED Triage Notes (Signed)
Patient seen cards on Friday and was started on metoprolol due to tachy but patient still having high heart rate.  Denies chest pain but feels weak and having intermittent sweating episodes. +SOB

## 2022-01-21 NOTE — Telephone Encounter (Signed)
Wife reports patient's heart rate has stayed elevated in the 120-120 range. BP 105/17 and 132/92. Patient has sob, weakness, shaky hands, and extremely tired. FSBS 220. Wife stated it was mentioned to her that patient needs thyroid blood work. Due to patient's symptoms, recommended that patient go the ER to be checked. Wife verbalized understanding.

## 2022-01-22 ENCOUNTER — Encounter (HOSPITAL_COMMUNITY): Payer: Self-pay | Admitting: Internal Medicine

## 2022-01-22 ENCOUNTER — Observation Stay (HOSPITAL_BASED_OUTPATIENT_CLINIC_OR_DEPARTMENT_OTHER): Payer: Medicare Other

## 2022-01-22 DIAGNOSIS — I4891 Unspecified atrial fibrillation: Secondary | ICD-10-CM | POA: Diagnosis present

## 2022-01-22 DIAGNOSIS — I340 Nonrheumatic mitral (valve) insufficiency: Secondary | ICD-10-CM

## 2022-01-22 DIAGNOSIS — U071 COVID-19: Secondary | ICD-10-CM | POA: Diagnosis not present

## 2022-01-22 DIAGNOSIS — I10 Essential (primary) hypertension: Secondary | ICD-10-CM | POA: Diagnosis not present

## 2022-01-22 DIAGNOSIS — E785 Hyperlipidemia, unspecified: Secondary | ICD-10-CM | POA: Diagnosis not present

## 2022-01-22 DIAGNOSIS — I484 Atypical atrial flutter: Secondary | ICD-10-CM

## 2022-01-22 LAB — GLUCOSE, CAPILLARY
Glucose-Capillary: 139 mg/dL — ABNORMAL HIGH (ref 70–99)
Glucose-Capillary: 163 mg/dL — ABNORMAL HIGH (ref 70–99)
Glucose-Capillary: 117 mg/dL — ABNORMAL HIGH (ref 70–99)
Glucose-Capillary: 164 mg/dL — ABNORMAL HIGH (ref 70–99)

## 2022-01-22 LAB — BASIC METABOLIC PANEL
Anion gap: 7 (ref 5–15)
BUN: 12 mg/dL (ref 8–23)
CO2: 23 mmol/L (ref 22–32)
Calcium: 8.8 mg/dL — ABNORMAL LOW (ref 8.9–10.3)
Chloride: 107 mmol/L (ref 98–111)
Creatinine, Ser: 1.07 mg/dL (ref 0.61–1.24)
GFR, Estimated: >60 mL/min (ref >60)
Glucose, Bld: 146 mg/dL — ABNORMAL HIGH (ref 70–99)
Potassium: 4.1 mmol/L (ref 3.5–5.1)
Sodium: 137 mmol/L (ref 135–145)

## 2022-01-22 LAB — CBC
HCT: 42.6 % (ref 39.0–52.0)
Hemoglobin: 14.9 g/dL (ref 13.0–17.0)
MCH: 31.3 pg (ref 26.0–34.0)
MCHC: 35 g/dL (ref 30.0–36.0)
MCV: 89.5 fL (ref 80.0–100.0)
Platelets: 150 10*3/uL (ref 150–400)
RBC: 4.76 MIL/uL (ref 4.22–5.81)
RDW: 13.8 % (ref 11.5–15.5)
WBC: 5.9 10*3/uL (ref 4.0–10.5)
nRBC: 0 % (ref 0.0–0.2)

## 2022-01-22 LAB — ECHOCARDIOGRAM COMPLETE
Height: 71 in
S' Lateral: 4.1 cm
Weight: 3104 oz

## 2022-01-22 MED ORDER — PERFLUTREN LIPID MICROSPHERE
1.0000 mL | INTRAVENOUS | Status: AC | PRN
  Administered 2022-01-22: 2 mL via INTRAVENOUS

## 2022-01-22 MED ORDER — METOPROLOL SUCCINATE ER 25 MG PO TB24
25.0000 mg | ORAL_TABLET | Freq: Every day | ORAL | Status: DC
  Administered 2022-01-23: 25 mg via ORAL
  Filled 2022-01-22: qty 1

## 2022-01-22 NOTE — Progress Notes (Addendum)
PROGRESS NOTE    Herbert Evans  QXI:[id number] DOB: [birth year] DOA: 01/21/2022 PCP: Erskine Emery, NP   Brief Narrative: 76 year old male with history of type 2 diabetes hyperlipidemia colon cancer NSVT hypertension admitted with 10 days of shortness of breath chest pain and increased weakness.  He was seen by the cardiologist 3 days prior to admission for abnormal rhythm atrial tachycardia versus atrial flutter and had his metoprolol restarted and was planning for an echo as an outpatient.  But due to persistent worsening symptoms patient came to the ER.  He was also on amiodarone at home.  He was started on Cardizem drip in the ER.  His troponin was negative.  TSH was normal.  COVID was positive.  CT of the chest shows left upper lobe density suspicious for pneumonia but cannot exclude underlying malignancy.  He has a history of pneumonectomy in 1967 left lower lobe for severe pneumonia at the age of 25, patient received Rocephin and azithromycin in the ED.  This has not been continued. Patient was in A-fib RVR with a rate of 110s.  He was admitted for further management.  Assessment & Plan:   Principal Problem:   Atrial fibrillation with rapid ventricular response (HCC) Active Problems:   Type 2 diabetes mellitus with complication, without long-term current use of insulin (HCC)   Hyperlipidemia LDL goal <100   History of pneumonectomy   Hypertension   NSVT (nonsustained ventricular tachycardia) (HCC)   COVID-19 virus infection   Atrial fibrillation with RVR (HCC)   #1 tachycardia in the setting of NSVT PVCs question atrial flutter has been started on Cardizem drip.  Currently on amiodarone which he was taking prior to admission with beta-blocker.  Consulted cardiology await their input.  Eliquis started during this hospital stay.  TSH normal. Echocardiogram-EF 40 to 45%.  Left ventricle demonstrates global hypokinesis.  Right ventricular systolic function is normal. ?  Possible EP  referral ?  Cardiac MRI Await cardiology input  #2 incidental COVID patient is tolerating on room air with normal saturation.  He denies any chest pain or shortness of breath at this time.  CT chest reported as possible left-sided pneumonia which was similar to appearance from last year. He is being watched off antibiotics. Continue symptomatic treatment for COVID.  #3 essential hypertension blood pressure soft 105/71 on med Toprol 12.5 daily, diltiazem drip and amiodarone.  Monitor closely.  #4 hyperlipidemia on Zetia and pitavastatin at home(history of multiple statin intolerance)  #5 history of type 2 diabetes on Farxiga, glipizide, metformin prior to admission On SSI. CBG (last 3)  Recent Labs    01/21/22 2127 01/22/22 0617 01/22/22 1117  GLUCAP 135* 139* 163*    #6 insomnia continue trazodone  #8 history of colon cancer and colectomy  #9 history of left pneumonectomy for severe pneumonia as a child   Estimated body mass index is 27.06 kg/m as calculated from the following:   Height as of this encounter: 5\' 11"  (1.803 m).   Weight as of this encounter: 88 kg.  DVT prophylaxis: Eliquis  code Status: Full code  family Communication: Wife at bedside Disposition Plan:  Status is: Observation The patient remains OBS appropriate and will d/c before 2 midnights.   Consultants:  Cardiology  Procedures: None Antimicrobials: None  Subjective: By the side of the bed he is anxious to go home denies any chest pain or shortness of breath or cough his wife is also at the bedside  Objective: Vitals:  01/22/22 0823 01/22/22 1119 01/22/22 1200 01/22/22 1420  BP: (!) 130/99 118/71 130/78 105/71  Pulse: 85 61 (!) 109 73  Resp: 19 20 19 18   Temp: 98 F (36.7 C) 98.5 F (36.9 C)    TempSrc: Oral Oral    SpO2: 94% 95% 96% 96%  Weight:      Height:        Intake/Output Summary (Last 24 hours) at 01/22/2022 1515 Last data filed at 01/22/2022 1419 Gross per 24 hour   Intake 830 ml  Output --  Net 830 ml   Filed Weights   01/21/22 1304  Weight: 88 kg    Examination:  General exam: Appears in no acute distress Respiratory system: Clear to auscultation. Respiratory effort normal. Cardiovascular system: S1 & S2 heard, irreg. No JVD, murmurs, rubs, gallops or clicks. No pedal edema. Gastrointestinal system: Abdomen is nondistended, soft and nontender. No organomegaly or masses felt. Normal bowel sounds heard. Central nervous system: Alert and oriented. No focal neurological deficits. Extremities: No edema Skin: No rashes, lesions or ulcers Psychiatry: Judgement and insight appear normal. Mood & affect appropriate.     Data Reviewed: I have personally reviewed following labs and imaging studies  CBC: Recent Labs  Lab 01/21/22 1027 01/22/22 0357  WBC 6.7 5.9  NEUTROABS 4.4  --   HGB 15.7 14.9  HCT 46.2 42.6  MCV 91.3 89.5  PLT 154 150   Basic Metabolic Panel: Recent Labs  Lab 01/21/22 1027 01/22/22 0357  NA 136 137  K 4.2 4.1  CL 102 107  CO2 23 23  GLUCOSE 130* 146*  BUN 11 12  CREATININE 0.93 1.07  CALCIUM 8.9 8.8*  MG 1.8  --    GFR: Estimated Creatinine Clearance: 64.5 mL/min (by C-G formula based on SCr of 1.07 mg/dL). Liver Function Tests: No results for input(s): "AST", "ALT", "ALKPHOS", "BILITOT", "PROT", "ALBUMIN" in the last 168 hours. No results for input(s): "LIPASE", "AMYLASE" in the last 168 hours. No results for input(s): "AMMONIA" in the last 168 hours. Coagulation Profile: No results for input(s): "INR", "PROTIME" in the last 168 hours. Cardiac Enzymes: No results for input(s): "CKTOTAL", "CKMB", "CKMBINDEX", "TROPONINI" in the last 168 hours. BNP (last 3 results) No results for input(s): "PROBNP" in the last 8760 hours. HbA1C: No results for input(s): "HGBA1C" in the last 72 hours. CBG: Recent Labs  Lab 01/21/22 2127 01/22/22 0617 01/22/22 1117  GLUCAP 135* 139* 163*   Lipid Profile: No  results for input(s): "CHOL", "HDL", "LDLCALC", "TRIG", "CHOLHDL", "LDLDIRECT" in the last 72 hours. Thyroid Function Tests: Recent Labs    01/21/22 1027  TSH 2.097   Anemia Panel: No results for input(s): "VITAMINB12", "FOLATE", "FERRITIN", "TIBC", "IRON", "RETICCTPCT" in the last 72 hours. Sepsis Labs: No results for input(s): "PROCALCITON", "LATICACIDVEN" in the last 168 hours.  Recent Results (from the past 240 hour(s))  SARS Coronavirus 2 by RT PCR (hospital order, performed in [facility] hospital lab) *cepheid single result test* Anterior Nasal Swab     Status: Abnormal   Collection Time: 01/21/22  1:38 PM   Specimen: Anterior Nasal Swab  Result Value Ref Range Status   SARS Coronavirus 2 by RT PCR POSITIVE (A) NEGATIVE Final    Comment: (NOTE) SARS-CoV-2 target nucleic acids are DETECTED  SARS-CoV-2 RNA is generally detectable in upper respiratory specimens  during the acute phase of infection.  Positive results are indicative  of the presence of the identified virus, but do not rule out bacterial infection  or co-infection with other pathogens not detected by the test.  Clinical correlation with patient history and  other diagnostic information is necessary to determine patient infection status.  The expected result is negative.  Fact Sheet for Patients:   RoadLapTop.co.zahttps://www.fda.gov/media/158405/download   Fact Sheet for Healthcare Providers:   http://kim-miller.com/https://www.fda.gov/media/158404/download    This test is not yet approved or cleared by the [place] FDA and  has been authorized for detection and/or diagnosis of SARS-CoV-2 by FDA under an Emergency Use Authorization (EUA).  This EUA will remain in effect (meaning this test can be used) for the duration of  the COVID-19 declaration under Section 564(b)(1)  of the Act, 21 U.S.C. section 360-bbb-3(b)(1), unless the authorization is terminated or revoked sooner.   Performed at [facility], [address], [place], [place] [address]          Radiology Studies: ECHOCARDIOGRAM COMPLETE  Result Date: 01/22/2022    ECHOCARDIOGRAM REPORT   Patient Name:   Odis LusterLARRY E Firkus Date of Exam: 01/22/2022 Medical Rec #:  [id number]      Height:       71.0 in Accession #:    [id number]     Weight:       194.0 lb Date of Birth:  04/04/1946      BSA:          2.081 m Patient Age:    76 years       BP:           118/71 mmHg Patient Gender: M              HR:           63 bpm. Exam Location:  Inpatient Procedure: 2D Echo, Cardiac Doppler, Color Doppler and Intracardiac            Opacification Agent Indications:    Atrial Fibrillation I48.91  History:        Patient has prior history of Echocardiogram examinations, most                 recent 10/15/2020. Arrythmias:NSVT; Risk Factors:Hypertension,                 Dyslipidemia and Diabetes. Hx of pneumonectomy, HCC.  Sonographer:    Milda SmartShannon O'Grady Referring Phys: [id number] Cecille PoALEXANDER B MELVIN  Sonographer Comments: Technically difficult study due to poor echo windows. Image acquisition challenging due to patient body habitus. IMPRESSIONS  1. Left ventricular ejection fraction, by estimation, is 40 to 45%. The left ventricle has mildly decreased function. The left ventricle demonstrates global hypokinesis. Left ventricular diastolic function could not be evaluated.  2. Right ventricular systolic function is normal. The right ventricular size is mildly enlarged. There is normal pulmonary artery systolic pressure. The estimated right ventricular systolic pressure is 29.8 mmHg.  3. The mitral valve is grossly normal. Mild to moderate mitral valve regurgitation. No evidence of mitral stenosis.  4. The aortic valve is tricuspid. Aortic valve regurgitation is not visualized. No aortic stenosis is present.  5. The inferior vena cava is normal in size with greater than 50% respiratory variability, suggesting right atrial pressure of 3 mmHg. Comparison(s): Changes from prior study are  noted. Afib now present. LVEF ~40-45%. FINDINGS  Left Ventricle: Left ventricular ejection fraction, by estimation, is 40 to 45%. The left ventricle has mildly decreased function. The left ventricle demonstrates global hypokinesis. Definity contrast agent was given IV to delineate the left ventricular  endocardial borders. The left ventricular  internal cavity size was normal in size. There is no left ventricular hypertrophy. Left ventricular diastolic function could not be evaluated due to atrial fibrillation. Left ventricular diastolic function could  not be evaluated. Right Ventricle: The right ventricular size is mildly enlarged. No increase in right ventricular wall thickness. Right ventricular systolic function is normal. There is normal pulmonary artery systolic pressure. The tricuspid regurgitant velocity is 2.59  m/s, and with an assumed right atrial pressure of 3 mmHg, the estimated right ventricular systolic pressure is 29.8 mmHg. Left Atrium: Left atrial size was normal in size. Right Atrium: Right atrial size was normal in size. Pericardium: There is no evidence of pericardial effusion. Mitral Valve: The mitral valve is grossly normal. Mild to moderate mitral valve regurgitation. No evidence of mitral valve stenosis. Tricuspid Valve: The tricuspid valve is grossly normal. Tricuspid valve regurgitation is trivial. No evidence of tricuspid stenosis. Aortic Valve: The aortic valve is tricuspid. Aortic valve regurgitation is not visualized. No aortic stenosis is present. Pulmonic Valve: The pulmonic valve was grossly normal. Pulmonic valve regurgitation is mild. No evidence of pulmonic stenosis. Aorta: The aortic root and ascending aorta are structurally normal, with no evidence of dilitation. Venous: The inferior vena cava is normal in size with greater than 50% respiratory variability, suggesting right atrial pressure of 3 mmHg. IAS/Shunts: The atrial septum is grossly normal.  LEFT VENTRICLE PLAX 2D  LVIDd:         4.80 cm LVIDs:         4.10 cm LV PW:         1.30 cm LV IVS:        1.00 cm LVOT diam:     2.10 cm LVOT Area:     3.46 cm  RIGHT VENTRICLE             IVC RV S prime:     11.20 cm/s  IVC diam: 1.80 cm TAPSE (M-mode): 1.2 cm LEFT ATRIUM             Index        RIGHT ATRIUM           Index LA diam:        4.70 cm 2.26 cm/m   RA Area:     12.60 cm LA Vol (A2C):   49.7 ml 23.88 ml/m  RA Volume:   25.40 ml  12.20 ml/m LA Vol (A4C):   61.9 ml 29.74 ml/m LA Biplane Vol: 56.1 ml 26.95 ml/m                        PULMONIC VALVE AORTA                 PR End Diast Vel: 6.35 msec Ao Root diam: 3.10 cm Ao Asc diam:  3.50 cm TRICUSPID VALVE TR Peak grad:   26.8 mmHg TR Vmax:        259.00 cm/s  SHUNTS Systemic Diam: 2.10 cm Lennie Odor MD Electronically signed by Lennie Odor MD Signature Date/Time: 01/22/2022/12:01:34 PM    Final    CT Chest Wo Contrast  Result Date: 01/21/2022 CLINICAL DATA:  Palpitations, intermittent shortness of breath, chest pain EXAM: CT CHEST WITHOUT CONTRAST TECHNIQUE: Multidetector CT imaging of the chest was performed following the standard protocol without IV contrast. RADIATION DOSE REDUCTION: This exam was performed according to the departmental dose-optimization program which includes automated exposure control, adjustment of the mA and/or kV according to patient size  and/or use of iterative reconstruction technique. COMPARISON:  Previous CT done on 01/24/2021, chest radiographs done earlier today FINDINGS: Cardiovascular: Coronary artery calcifications are seen. There is ectasia of the main pulmonary artery measuring 3.8 cm suggesting pulmonary arterial hypertension. Mediastinum/Nodes: There is shift of mediastinum to the left suggesting decreased volume in the left lung. Subcentimeter nodes in mediastinum appear stable. Lungs/Pleura: Linear densities in right lower lung field appears stable. There is small focus of increased interstitial markings in the medial right  apical region with no significant change. There is decreased volume in the left lung suggesting previous partial resection. In image 48 of series 4, there is 2 cm pleural-based alveolar density in left upper lung field which measured 11 mm in the previous study. There are other linear patchy density seen right mid and right lower lung fields. There is pleural thickening with pleural calcifications in the posteromedial left lower lung field. There is no pneumothorax. Upper Abdomen: No acute findings are seen. Musculoskeletal: Degenerative changes are noted in thoracic spine with extensive calcifications in the anterior spinal ligament. Please correlate for possible ankylosing spondylitis. IMPRESSION: There is 2 cm focal alveolar density in lateral aspect of left upper lung field suggesting pneumonia. Possibility of underlying neoplastic process is not excluded. Short-term follow-up CT in 3 months may be considered. There are other linear densities in left lower lung field suggesting scarring. There is decreased volume in the left lung suggesting previous partial resection. Coronary artery disease. There is ectasia of the main pulmonary artery suggesting pulmonary arterial hypertension. Electronically Signed   By: Ernie Avena M.D.   On: 01/21/2022 15:35   DG Chest Port 1 View  Result Date: 01/21/2022 CLINICAL DATA:  76 year old male presenting for evaluation of cough and dyspnea. EXAM: PORTABLE CHEST 1 VIEW COMPARISON:  CT of the chest from September of 2022 and chest x-ray of December 07, 2020 FINDINGS: Trachea midline. Cardiomediastinal contours and hilar structures are stable. 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 EKG leads project over the chest. No lobar consolidative process. Mild increased interstitial markings and suggestion of alveolar opacities developing in the LEFT mid chest. LEFT apical pleural  and parenchymal scarring. W on limited assessment no acute skeletal findings. IMPRESSION: Post LEFT lower lobectomy with increasing interstitial and airspace opacities in the LEFT mid chest suspicious for pneumonia. Findings are similar to November 10, 2020. Consider PA and lateral chest radiograph on follow-up to ensure resolution. Electronically Signed   By: Donzetta Kohut M.D.   On: 01/21/2022 14:01        Scheduled Meds:  amiodarone  200 mg Oral Daily   apixaban  5 mg Oral BID   ezetimibe  10 mg Oral Daily   insulin aspart  0-9 Units Subcutaneous TID WC   memantine  5 mg Oral BID   metoprolol succinate  12.5 mg Oral Daily   sodium chloride flush  3 mL Intravenous Q12H   traZODone  100 mg Oral QHS   Continuous Infusions:  diltiazem (CARDIZEM) infusion 10 mg/hr (01/22/22 1419)     LOS: 1 day    Time spent: 39 min   Alwyn Ren, MD 01/22/2022, 3:15 PM

## 2022-01-22 NOTE — Consult Note (Addendum)
Cardiology Consultation   Patient ID: Herbert Evans MRN: [id number]; DOB: [birth year]  Admit date: 01/21/2022 Date of Consult: 01/22/2022  PCP:  Herbert Emery, NP   [facility] Providers Cardiologist:  Herbert Ripple, DO        Patient Profile:   Herbert Evans is a 76 y.o. male with a hx of HLD with statin intolerance, NSVT, PVCs, PAT, DM, HTN, RBBB, colon CA, pneumonectomy 1967 of LLL (severe PNA age 89), chronic cough, recent undetermined tachycardia who is being seen 01/22/2022 for the evaluation of tachycardia at the request of Herbert Evans.  History of Present Illness:   Herbert Evans was remotely followed by Herbert Evans in 2018 for NSVT. Note indicates normal EF by echo and normal coronaries by cath in early 2018 (report not available). He was treated with beta blocker. He re-established with Herbert Evans in 2021. He was also seen by pharmD clinic to assist with HLD management given prior failed trials of atorvastatin, rosuvastatin, and lovastatin due to myalgias and trouble sleeping. He was unable to afford [id number] per notes and has been treated with Zetia. In 2022 he had increased palpitations and monitor showed episodes of NSVT, occasional PVCs as well as paroxysmal atrial tachycardia so Herbert Evans started him on amiodarone. Her note also references a nuclear stress test around that time at [facility] which was negative for ischemia. He called in in May 2023 with low HRs (39-40s) and beta blocker was held. He was supposed to follow up but did not as symptoms went away. He was seen back in our office 01/18/22 with weakness, palpitations, chest pain, and sweating. He was found to be tachycardic in the 1teens in an unclear rhythm per PA/MD review, possibly atrial tachycardia versus atrial flutter. His metoprolol was restarted with plan for echo and close follow-up. However, he presented to the hospital 01/21/22 with continued symptoms, reporting 10-15 days total of increasing SOB, weakness, and  some sharp/burning chest pain. He described the chest discomfort as a stinging sensation that would come and go over the last 2 weeks, not specifically anginal or exertional in nature. He was found to be positive for Covid. Troponins were negative. TSH was normal. CXR showed Post LLLobectomy with increasing interstitial and airspace opacities in the left mid chest suspicious for pneumonia. CT chest showed 2 cm focal alveolar density in lateral aspect of left upper lung field suggesting pneumonia, needing short term f/u to exclude neoplasm, otherwise with coronary artery calcification and ectasia of the main PA. 2D echo is pending. His admit EKG continued to show a regular wide complex tachycardia with follow-up tracing reading as atrial fib though I suspect atrial flutter based on appearance and telemetry. He was started on Eliquis and diltiazem drip.  He had improvement in HR as low as the 60s but is variable at other times - telemetry most clearly looks like flutter when he's not tachycardic. Also received abx in ER. Labs show normal K, Cr, CBC, TSH, neg troponins x 2.   He does report feeling better today. His chest discomfort has resolved. He denies any change in chronic cough. No edema. He has chronic orthopnea since his lung surgery remotely.   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    DOE (dyspnea on exertion)    History of  colon cancer    03-01-2005   s/p  sigmoid colectomy w/ negative nodes  (07-27-2019  per pt last colonoscopy 2019 , no recurrence)   History of Helicobacter pylori infection    09/ 2006   History of kidney stones    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 - severe PNA age 35   Hypertension    Mixed hyperlipidemia    NSVT (nonsustained ventricular tachycardia) (HCC)    Paroxysmal ventricular tachycardia (HCC)    PAT (paroxysmal  atrial tachycardia) (HCC)    PNA (pneumonia) 10/13/2020   Pneumonia    PVC's (premature ventricular contractions)    RBBB (right bundle branch block)    Right ureteral stone    Type 2 diabetes mellitus (HCC)    followed by pcp --- (07-27-2019  checks blood sugar three times per week in am,  fasting sugar-- 125-160)   Wears dentures    upper    Past Surgical History:  Procedure Laterality Date   CARDIAC CATHETERIZATION  02-09-2002   Herbert Evans     ef 50-55% w/ 10-15% LAD   CARDIAC CATHETERIZATION  06-28-2016   Herbert Evans    for arrhythmia)--- normal coronaries, mild focal wall motion abnormallity of distal inferapical wall,  severe right innominate / subclavin tortuosity   CATARACT EXTRACTION W/ INTRAOCULAR LENS  IMPLANT, BILATERAL  2018   COLON RESECTION SIGMOID  03-01-2005      CYSTOSCOPY WITH RETROGRADE PYELOGRAM, URETEROSCOPY AND STENT PLACEMENT Right 08/03/2019   Procedure: CYSTOSCOPY WITH RIGHT  RETROGRADE PYELOGRAM, URETEROSCOPY HOLMIUMM LASER AND STENT PLACEMENT;  Surgeon: Herbert Pippin, MD;  Location: [facility];  Service: Urology;  Laterality: Right;   CYSTOSCOPY/RETROGRADE/URETEROSCOPY/STONE EXTRACTION WITH BASKET  1970s   LUNG REMOVAL, PARTIAL Left 1967   lower lobe for pneumonia per pt   TONSILLECTOMY AND ADENOIDECTOMY  child     Home Medications:  Prior to Admission medications   Medication Sig Start Date End Date Taking? Authorizing Provider  albuterol (ACCUNEB) 0.63 MG/3ML nebulizer solution Take 1 ampule by nebulization every 6 (six) hours as needed for wheezing.    [provider]  amiodarone (PACERONE) 200 MG tablet TAKE 1 TABLET BY MOUTH DAILY. 11/09/21   Evans, Kardie, DO  aspirin EC 81 MG tablet Take 81 mg by mouth every morning.    [provider]  ezetimibe (ZETIA) 10 MG tablet Take 1 tablet by mouth once daily 01/15/22   Evans, Kardie, DO  FARXIGA 10 MG TABS tablet Take 10 mg by mouth daily. 04/02/21   [provider]  glipiZIDE (GLUCOTROL XL) 2.5 MG 24 hr tablet Take 2.5 mg by mouth daily. 01/04/22   [provider]  memantine (NAMENDA) 5 MG tablet Take 5 mg by mouth 2 (two) times daily. 11/02/19   [provider]  metFORMIN (GLUCOPHAGE) 1000 MG tablet Take 1,000 mg by mouth 2 (two) times daily with a meal.     [provider]  metoprolol succinate (TOPROL XL) 25 MG 24 hr tablet Take 0.5 tablets (12.5 mg total) by mouth daily. 01/18/22   Azalee Course, PA  montelukast (SINGULAIR) 10 MG tablet Take 10 mg by mouth daily. 11/22/21   [provider]  OXYGEN Inhale 2 L into the lungs. At night    [provider]  Pitavastatin Calcium (LIVALO) 2 MG TABS Take 1 tablet (2 mg total) by mouth daily. Patient not taking: Reported on 01/18/2022 01/10/20   Herbert Ripple, DO  traZODone (  DESYREL) 100 MG tablet Take 100 mg by mouth at bedtime. 08/02/19   [provider]    Inpatient Medications: Scheduled Meds:  amiodarone  200 mg Oral Daily   apixaban  5 mg Oral BID   ezetimibe  10 mg Oral Daily   insulin aspart  0-9 Units Subcutaneous TID WC   memantine  5 mg Oral BID   metoprolol succinate  12.5 mg Oral Daily   sodium chloride flush  3 mL Intravenous Q12H   traZODone  100 mg Oral QHS   Continuous Infusions:  diltiazem (CARDIZEM) infusion 12.5 mg/hr (01/22/22 1204)   PRN Meds: acetaminophen **OR** acetaminophen, albuterol, perflutren lipid microspheres (DEFINITY) IV suspension, polyethylene glycol  Allergies:    Allergies  Allergen Reactions   Atorvastatin Other (See Comments)    myalgias   Rosuvastatin Other (See Comments)    myalgias    Social History:   Social History   Socioeconomic History   Marital status: Married    Spouse name: Not on file   Number of children: Not on file   Years of education: Not on file   Highest education level: Not on file  Occupational History   Not on file  Tobacco Use   Smoking status: Former    Types: Cigars    Smokeless tobacco: Current    Types: Snuff, Chew   Tobacco comments:    01/26/20 dips still last cigar was in 1976  Substance and Sexual Activity   Alcohol use: No   Drug use: Never   Sexual activity: Not on file  Other Topics Concern   Not on file  Social History Narrative   Not on file   Social Determinants of Health   Financial Resource Strain: Not on file  Food Insecurity: Not on file  Transportation Needs: Not on file  Physical Activity: Not on file  Stress: Not on file  Social Connections: Not on file  Intimate Partner Violence: Not on file    Family History:    Family History  Problem Relation Age of Onset   Asthma Mother    Heart attack Mother    Stroke Father    Lung cancer Sister    Lung cancer Sister    Lung cancer Sister      ROS:  Please see the history of present illness.   All other ROS reviewed and negative.     Physical Exam/Data:   Vitals:   01/22/22 0358 01/22/22 0823 01/22/22 1119 01/22/22 1200  BP: (!) 119/59 (!) 130/99 118/71 130/78  Pulse: (!) 43 85 61 (!) 109  Resp: 19 19 20 19   Temp: 98.1 F (36.7 C) 98 F (36.7 C) 98.5 F (36.9 C)   TempSrc: Oral Oral Oral   SpO2: 97% 94% 95% 96%  Weight:      Height:        Intake/Output Summary (Last 24 hours) at 01/22/2022 1212 Last data filed at 01/22/2022 1010 Gross per 24 hour  Intake 590 ml  Output --  Net 590 ml      01/21/2022    1:04 PM 01/21/2022   10:09 AM 01/18/2022    3:48 PM  Last 3 Weights  Weight (lbs) 194 lb 194 lb 194 lb 6.4 oz  Weight (kg) 87.998 kg 87.998 kg 88.179 kg     Body mass index is 27.06 kg/m.  General: Well developed, well nourished WM, in no acute distress. Head: Normocephalic, atraumatic, sclera non-icteric, no xanthomas, nares are without discharge. Neck: Negative  for carotid bruits. JVP not elevated. Lungs: decreased BS L base, otherwise rhonchorous throughout. Breathing is unlabored. Heart: Regular rhythm, tachycardic, S1 S2 without murmurs, rubs, or  gallops.  Abdomen: Soft, non-tender, non-distended with normoactive bowel sounds. No rebound/guarding. Extremities: No clubbing or cyanosis. No edema. Distal pedal pulses are 2+ and equal bilaterally. Neuro: Alert and oriented X 3. Moves all extremities spontaneously. Psych:  Responds to questions appropriately with a normal affect.   EKG:  The EKG was personally reviewed and demonstrates:    Initial tracing: regular wide complex QRS rhythm, tachycardic at 119bpm, with RBBB + LPFB ( atrial tach versus atrial flutter Most recent tracing: tachycardic, irregular rhythm - suspected atrial fib but question of low P wave amplitude activity in V1 - RBBB + LPFB, one PVC   Telemetry:  Telemetry was personally reviewed and demonstrates:  regular wide complex tachycardia suspected atrial flutter based on intermittent breaks  Relevant CV Studies: 2d echo 10/2020  1. Left ventricular ejection fraction, by estimation, is 55 to 60%. The  left ventricle has normal function. The left ventricle has no regional  wall motion abnormalities. Left ventricular diastolic parameters are  consistent with Grade I diastolic  dysfunction (impaired relaxation).   2. Right ventricular systolic function is normal. The right ventricular  size is mildly enlarged. There is mildly elevated pulmonary artery  systolic pressure. The estimated right ventricular systolic pressure is  37.3 mmHg.   3. Left atrial size was mildly dilated.   4. The mitral valve is grossly normal. Mild to possibly moderate,  eccentric mitral valve regurgitation.   5. The aortic valve is tricuspid. There is mild calcification of the  aortic valve. Aortic valve regurgitation is trivial.   6. The inferior vena cava is normal in size with greater than 50%  respiratory variability, suggesting right atrial pressure of 3 mmHg.   Laboratory Data:  High Sensitivity Troponin:   Recent Labs  Lab 01/21/22 1027 01/21/22 1350  TROPONINIHS 10 9      Chemistry Recent Labs  Lab 01/21/22 1027 01/22/22 0357  NA 136 137  K 4.2 4.1  CL 102 107  CO2 23 23  GLUCOSE 130* 146*  BUN 11 12  CREATININE 0.93 1.07  CALCIUM 8.9 8.8*  MG 1.8  --   GFRNONAA >60 >60  ANIONGAP 11 7    No results for input(s): "PROT", "ALBUMIN", "AST", "ALT", "ALKPHOS", "BILITOT" in the last 168 hours. Lipids No results for input(s): "CHOL", "TRIG", "HDL", "LABVLDL", "LDLCALC", "CHOLHDL" in the last 168 hours.  Hematology Recent Labs  Lab 01/21/22 1027 01/22/22 0357  WBC 6.7 5.9  RBC 5.06 4.76  HGB 15.7 14.9  HCT 46.2 42.6  MCV 91.3 89.5  MCH 31.0 31.3  MCHC 34.0 35.0  RDW 13.8 13.8  PLT 154 150   Thyroid  Recent Labs  Lab 01/21/22 1027  TSH 2.097    BNPNo results for input(s): "BNP", "PROBNP" in the last 168 hours.  DDimer No results for input(s): "DDIMER" in the last 168 hours.   Radiology/Studies:  ECHOCARDIOGRAM COMPLETE  Result Date: 01/22/2022    ECHOCARDIOGRAM REPORT   Patient Name:   Herbert Evans Date of Exam: 01/22/2022 Medical Rec #:  [id number]      Height:       71.0 in Accession #:    [id number]     Weight:       194.0 lb Date of Birth:  1945-10-16      BSA:  2.081 m Patient Age:    76 years       BP:           118/71 mmHg Patient Gender: M              HR:           63 bpm. Exam Location:  Inpatient Procedure: 2D Echo, Cardiac Doppler, Color Doppler and Intracardiac            Opacification Agent Indications:    Atrial Fibrillation I48.91  History:        Patient has prior history of Echocardiogram examinations, most                 recent 10/15/2020. Arrythmias:NSVT; Risk Factors:Hypertension,                 Dyslipidemia and Diabetes. Hx of pneumonectomy, HCC.  Sonographer:    Milda Smart Referring Phys: [id number] Cecille Po MELVIN  Sonographer Comments: Technically difficult study due to poor echo windows. Image acquisition challenging due to patient body habitus. IMPRESSIONS  1. Left ventricular ejection fraction, by  estimation, is 40 to 45%. The left ventricle has mildly decreased function. The left ventricle demonstrates global hypokinesis. Left ventricular diastolic function could not be evaluated.  2. Right ventricular systolic function is normal. The right ventricular size is mildly enlarged. There is normal pulmonary artery systolic pressure. The estimated right ventricular systolic pressure is 29.8 mmHg.  3. The mitral valve is grossly normal. Mild to moderate mitral valve regurgitation. No evidence of mitral stenosis.  4. The aortic valve is tricuspid. Aortic valve regurgitation is not visualized. No aortic stenosis is present.  5. The inferior vena cava is normal in size with greater than 50% respiratory variability, suggesting right atrial pressure of 3 mmHg. Comparison(s): Changes from prior study are noted. Afib now present. LVEF ~40-45%. FINDINGS  Left Ventricle: Left ventricular ejection fraction, by estimation, is 40 to 45%. The left ventricle has mildly decreased function. The left ventricle demonstrates global hypokinesis. Definity contrast agent was given IV to delineate the left ventricular  endocardial borders. The left ventricular internal cavity size was normal in size. There is no left ventricular hypertrophy. Left ventricular diastolic function could not be evaluated due to atrial fibrillation. Left ventricular diastolic function could  not be evaluated. Right Ventricle: The right ventricular size is mildly enlarged. No increase in right ventricular wall thickness. Right ventricular systolic function is normal. There is normal pulmonary artery systolic pressure. The tricuspid regurgitant velocity is 2.59  m/s, and with an assumed right atrial pressure of 3 mmHg, the estimated right ventricular systolic pressure is 29.8 mmHg. Left Atrium: Left atrial size was normal in size. Right Atrium: Right atrial size was normal in size. Pericardium: There is no evidence of pericardial effusion. Mitral Valve: The  mitral valve is grossly normal. Mild to moderate mitral valve regurgitation. No evidence of mitral valve stenosis. Tricuspid Valve: The tricuspid valve is grossly normal. Tricuspid valve regurgitation is trivial. No evidence of tricuspid stenosis. Aortic Valve: The aortic valve is tricuspid. Aortic valve regurgitation is not visualized. No aortic stenosis is present. Pulmonic Valve: The pulmonic valve was grossly normal. Pulmonic valve regurgitation is mild. No evidence of pulmonic stenosis. Aorta: The aortic root and ascending aorta are structurally normal, with no evidence of dilitation. Venous: The inferior vena cava is normal in size with greater than 50% respiratory variability, suggesting right atrial pressure of 3 mmHg. IAS/Shunts: The atrial septum is grossly normal.  LEFT VENTRICLE PLAX 2D LVIDd:         4.80 cm LVIDs:         4.10 cm LV PW:         1.30 cm LV IVS:        1.00 cm LVOT diam:     2.10 cm LVOT Area:     3.46 cm  RIGHT VENTRICLE             IVC RV S prime:     11.20 cm/s  IVC diam: 1.80 cm TAPSE (M-mode): 1.2 cm LEFT ATRIUM             Index        RIGHT ATRIUM           Index LA diam:        4.70 cm 2.26 cm/m   RA Area:     12.60 cm LA Vol (A2C):   49.7 ml 23.88 ml/m  RA Volume:   25.40 ml  12.20 ml/m LA Vol (A4C):   61.9 ml 29.74 ml/m LA Biplane Vol: 56.1 ml 26.95 ml/m                        PULMONIC VALVE AORTA                 PR End Diast Vel: 6.35 msec Ao Root diam: 3.10 cm Ao Asc diam:  3.50 cm TRICUSPID VALVE TR Peak grad:   26.8 mmHg TR Vmax:        259.00 cm/s  SHUNTS Systemic Diam: 2.10 cm Lennie Odor MD Electronically signed by Lennie Odor MD Signature Date/Time: 01/22/2022/12:01:34 PM    Final    CT Chest Wo Contrast  Result Date: 01/21/2022 CLINICAL DATA:  Palpitations, intermittent shortness of breath, chest pain EXAM: CT CHEST WITHOUT CONTRAST TECHNIQUE: Multidetector CT imaging of the chest was performed following the standard protocol without IV contrast.  RADIATION DOSE REDUCTION: This exam was performed according to the departmental dose-optimization program which includes automated exposure control, adjustment of the mA and/or kV according to patient size and/or use of iterative reconstruction technique. COMPARISON:  Previous CT done on 01/24/2021, chest radiographs done earlier today FINDINGS: Cardiovascular: Coronary artery calcifications are seen. There is ectasia of the main pulmonary artery measuring 3.8 cm suggesting pulmonary arterial hypertension. Mediastinum/Nodes: There is shift of mediastinum to the left suggesting decreased volume in the left lung. Subcentimeter nodes in mediastinum appear stable. Lungs/Pleura: Linear densities in right lower lung field appears stable. There is small focus of increased interstitial markings in the medial right apical region with no significant change. There is decreased volume in the left lung suggesting previous partial resection. In image 48 of series 4, there is 2 cm pleural-based alveolar density in left upper lung field which measured 11 mm in the previous study. There are other linear patchy density seen right mid and right lower lung fields. There is pleural thickening with pleural calcifications in the posteromedial left lower lung field. There is no pneumothorax. Upper Abdomen: No acute findings are seen. Musculoskeletal: Degenerative changes are noted in thoracic spine with extensive calcifications in the anterior spinal ligament. Please correlate for possible ankylosing spondylitis. IMPRESSION: There is 2 cm focal alveolar density in lateral aspect of left upper lung field suggesting pneumonia. Possibility of underlying neoplastic process is not excluded. Short-term follow-up CT in 3 months may be considered. There are other linear densities in left lower lung field suggesting  scarring. There is decreased volume in the left lung suggesting previous partial resection. Coronary artery disease. There is ectasia  of the main pulmonary artery suggesting pulmonary arterial hypertension. Electronically Signed   By: Ernie Avena M.D.   On: 01/21/2022 15:35   DG Chest Port 1 View  Result Date: 01/21/2022 CLINICAL DATA:  76 year old male presenting for evaluation of cough and dyspnea. EXAM: PORTABLE CHEST 1 VIEW COMPARISON:  CT of the chest from September of 2022 and chest x-ray of December 07, 2020 FINDINGS: Trachea midline. Cardiomediastinal contours and hilar structures are stable. 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 EKG leads project over the chest. No lobar consolidative process. Mild increased interstitial markings and suggestion of alveolar opacities developing in the LEFT mid chest. LEFT apical pleural and parenchymal scarring. W on limited assessment no acute skeletal findings. IMPRESSION: Post LEFT lower lobectomy with increasing interstitial and airspace opacities in the LEFT mid chest suspicious for pneumonia. Findings are similar to November 10, 2020. Consider PA and lateral chest radiograph on follow-up to ensure resolution. Electronically Signed   By: Donzetta Kohut M.D.   On: 01/21/2022 14:01     Assessment and Plan:   1. Tachycardia, with known hx of NSVT, PVCs, PAT, RBBB in the past also recent sinus bradycardia 09/2021 - telemetry and EKG suspicious for atrial flutter best seen during variable conduction, will review tracings with MD to confirm since he has also been newly started on Eliquis - current rx otherwise includes amiodarone 200mg  daily (previously on board for PAT/NSVT), metoprolol 12.5mg  daily, IV diltiazem @ 12.5mg /hr - need to keep in mind h/o reported bradycardia in 09/2021 as well as RBBB+LPFB - likely would benefit from EP f/u as well, query whether OP cMRI would be of value  - TSH wnl - will add CMET with AM labs - repeat echo pending  2. Atypical chest pain - ? Related to PNA  and abnormal CT findings - does not sound anginal in nature and troponins negative despite nearly 10-15 days of symptoms - does not specifically seem rate related as he is CP free now and HR presently 118 (back down to 80s-90s by the end of the encounter) - await echo  3. Covid-19 positive with possible PNA - lung exam notable for decreased BS L base as well as rhonchi - abnormal CT as above, will also require OP f/u as well - further management per primary team  4. Coronary calcification - normal cath 2018, negative stress test in 2022 per notes - continue ezetimibe and OP f/u  5. DM - per IM  Risk Assessment/Risk Scores:          CHA2DS2-VASc Score = 4   This indicates a 4.8% annual risk of stroke. The patient's score is based upon: CHF History: 0 HTN History: 1 Diabetes History: 1 Stroke History: 0 Vascular Disease History: 1 Age Score: 1 Gender Score: 0         For questions or updates, please contact [facility] Please consult [email or web address] for contact info under    Signed, 2023, PA-C  01/22/2022 12:12 PM

## 2022-01-22 NOTE — Care Management CC44 (Signed)
Condition Code 44 Documentation Completed  Patient Details  Name: Herbert Evans MRN: [id number] Date of Birth: 01-22-1946   Condition Code 44 given:  Yes Patient signature on Condition Code 44 notice:  Yes Documentation of 2 MD's agreement:  Yes Code 44 added to claim:  Yes    Darrold Span, RN 01/22/2022, 11:50 AM

## 2022-01-22 NOTE — Care Management Obs Status (Signed)
MEDICARE OBSERVATION STATUS NOTIFICATION   Patient Details  Name: Herbert Evans MRN: [id number] Date of Birth: [birth year]   Medicare Observation Status Notification Given:  Yes    Darrold Span, RN 01/22/2022, 11:50 AM

## 2022-01-22 NOTE — Progress Notes (Signed)
  Echocardiogram 2D Echocardiogram has been performed.  Milda Smart 01/22/2022, 11:47 AM

## 2022-01-23 ENCOUNTER — Other Ambulatory Visit (HOSPITAL_COMMUNITY): Payer: Self-pay

## 2022-01-23 ENCOUNTER — Telehealth (HOSPITAL_COMMUNITY): Payer: Self-pay | Admitting: Pharmacy Technician

## 2022-01-23 DIAGNOSIS — E785 Hyperlipidemia, unspecified: Secondary | ICD-10-CM | POA: Diagnosis not present

## 2022-01-23 DIAGNOSIS — I484 Atypical atrial flutter: Secondary | ICD-10-CM | POA: Diagnosis not present

## 2022-01-23 DIAGNOSIS — U071 COVID-19: Secondary | ICD-10-CM | POA: Diagnosis not present

## 2022-01-23 DIAGNOSIS — I10 Essential (primary) hypertension: Secondary | ICD-10-CM | POA: Diagnosis not present

## 2022-01-23 DIAGNOSIS — I4891 Unspecified atrial fibrillation: Secondary | ICD-10-CM | POA: Diagnosis not present

## 2022-01-23 DIAGNOSIS — I5041 Acute combined systolic (congestive) and diastolic (congestive) heart failure: Secondary | ICD-10-CM | POA: Diagnosis not present

## 2022-01-23 DIAGNOSIS — Z9889 Other specified postprocedural states: Secondary | ICD-10-CM | POA: Diagnosis not present

## 2022-01-23 LAB — COMPREHENSIVE METABOLIC PANEL
ALT: 43 U/L (ref 0–44)
AST: 27 U/L (ref 15–41)
Albumin: 3.6 g/dL (ref 3.5–5.0)
Alkaline Phosphatase: 51 U/L (ref 38–126)
Anion gap: 11 (ref 5–15)
BUN: 20 mg/dL (ref 8–23)
CO2: 21 mmol/L — ABNORMAL LOW (ref 22–32)
Calcium: 9.1 mg/dL (ref 8.9–10.3)
Chloride: 104 mmol/L (ref 98–111)
Creatinine, Ser: 1.1 mg/dL (ref 0.61–1.24)
GFR, Estimated: >60 mL/min (ref >60)
Glucose, Bld: 228 mg/dL — ABNORMAL HIGH (ref 70–99)
Potassium: 4.3 mmol/L (ref 3.5–5.1)
Sodium: 136 mmol/L (ref 135–145)
Total Bilirubin: 0.9 mg/dL (ref 0.3–1.2)
Total Protein: 6.9 g/dL (ref 6.5–8.1)

## 2022-01-23 LAB — GLUCOSE, CAPILLARY
Glucose-Capillary: 175 mg/dL — ABNORMAL HIGH (ref 70–99)
Glucose-Capillary: 267 mg/dL — ABNORMAL HIGH (ref 70–99)

## 2022-01-23 LAB — CBC
HCT: 46 % (ref 39.0–52.0)
Hemoglobin: 16.2 g/dL (ref 13.0–17.0)
MCH: 31.5 pg (ref 26.0–34.0)
MCHC: 35.2 g/dL (ref 30.0–36.0)
MCV: 89.3 fL (ref 80.0–100.0)
Platelets: 202 10*3/uL (ref 150–400)
RBC: 5.15 MIL/uL (ref 4.22–5.81)
RDW: 13.7 % (ref 11.5–15.5)
WBC: 9.4 10*3/uL (ref 4.0–10.5)
nRBC: 0 % (ref 0.0–0.2)

## 2022-01-23 MED ORDER — METOPROLOL SUCCINATE ER 50 MG PO TB24: 50.0000 mg | ORAL_TABLET | Freq: Every day | ORAL | 0 refills | Status: DC

## 2022-01-23 MED ORDER — METOPROLOL SUCCINATE ER 50 MG PO TB24: 50.0000 mg | ORAL_TABLET | Freq: Every day | ORAL | Status: DC

## 2022-01-23 MED ORDER — APIXABAN 5 MG PO TABS: 5.0000 mg | ORAL_TABLET | Freq: Two times a day (BID) | ORAL | 0 refills | Status: DC

## 2022-01-23 NOTE — TOC Transition Note (Signed)
Transition of Care (TOC) - CM/SW Discharge Note Donn Pierini RN, BSN Transitions of Care Unit 4E- RN Case Manager See Treatment Team for direct phone #    Patient Details  Name: Herbert Evans MRN: [id number] Date of Birth: July 30, 1945  Transition of Care [facility]) CM/SW Contact:  Darrold Span, RN Phone Number: 01/23/2022, 4:28 PM   Clinical Narrative:    Pt stable for transition home today- DME- rollator has been ordered and in house provider called for delivery to the room.  No further TOC needs noted. Pt new to Eliquis- Pharmacy provided info and 30 day free trail card.      Final next level of care: Home/Self Care Barriers to Discharge: No Barriers Identified   Patient Goals and CMS Choice Patient states their goals for this hospitalization and ongoing recovery are:: return home CMS Medicare.gov Compare Post Acute Care list provided to:: Patient Choice offered to / list presented to : Patient  Discharge Placement                 Home      Discharge Plan and Services   Discharge Planning Services: CM Consult Post Acute Care Choice: Durable Medical Equipment          DME Arranged: Walker rolling with seat DME Agency: [employer] Date DME Agency Contacted: 01/23/22 Time DME Agency Contacted: 1505 Representative spoke with at DME Agency: Arnold Long HH Arranged: NA HH Agency: NA        Social Determinants of Health (SDOH) Interventions     Readmission Risk Interventions    01/23/2022    4:28 PM  Readmission Risk Prevention Plan  Post Dischage Appt Complete  Medication Screening Complete

## 2022-01-23 NOTE — Telephone Encounter (Signed)
Pharmacy Patient Advocate Encounter  Insurance verification completed.    The patient is insured through [employer] Part D   The patient is currently admitted and ran test claims for the following: Eliquis.  Copays and coinsurance results were relayed to Inpatient clinical team.  

## 2022-01-23 NOTE — Inpatient Diabetes Management (Signed)
Inpatient Diabetes Program Recommendations  AACE/ADA: New Consensus Statement on Inpatient Glycemic Control (2015)  Target Ranges:  Prepandial:   less than 140 mg/dL      Peak postprandial:   less than 180 mg/dL (1-2 hours)      Critically ill patients:  140 - 180 mg/dL   Lab Results  Component Value Date   GLUCAP 267 (H) 01/23/2022   HGBA1C 8.3 (H) 10/15/2020    Review of Glycemic Control  Diabetes history: DM 2 Outpatient Diabetes medications: Farxiga 10 mg Daily, Glipizide 2.5 mg Daily, Metformin 1000 mg bid Current orders for Inpatient glycemic control:  Novolog 0-9 units tid  Inpatient Diabetes Program Recommendations:    Note pt did not take the insulin at breakfast and glucose trends increased at lunchtime to 267.  Watch trends for now. No changes at this time  Thanks,  Christena Deem RN, MSN, BC-ADM Inpatient Diabetes Coordinator Team Pager [phone] (8a-5p)

## 2022-01-23 NOTE — Evaluation (Addendum)
Physical Therapy Evaluation and Discharge Patient Details Name: Herbert Evans MRN: [id number] DOB: 1945-11-08 Today's Date: 01/23/2022  History of Present Illness  Pt is a 76 y/o M presenting to ED on 9/9 with SOB, tachycardia, and weakness; admitted for A fib with RVR. PMH includes HTN, HLD, DM, colon CA, and pneumectomy of L lower lobe.  Clinical Impression  Patient evaluated by Physical Therapy with no further acute PT needs identified. Pt appears to be fairly close to his functional baseline. Ambulating 250 ft with and without a Rollator modI, HR 103 bpm, SpO2 93-96% on RA. Pt with preference to use Rollator initially for energy conservation. All education has been completed and the patient has no further questions. No follow-up Physical Therapy or equipment needs. PT is signing off. Thank you for this referral.      Recommendations for follow up therapy are one component of a multi-disciplinary discharge planning process, led by the attending physician.  Recommendations may be updated based on patient status, additional functional criteria and insurance authorization.  Follow Up Recommendations No PT follow up      Assistance Recommended at Discharge None  Patient can return home with the following       Equipment Recommendations Rollator (4 wheels)  Recommendations for Other Services       Functional Status Assessment Patient has had a recent decline in their functional status and demonstrates the ability to make significant improvements in function in a reasonable and predictable amount of time.     Precautions / Restrictions Precautions Precautions: None Restrictions Weight Bearing Restrictions: No      Mobility  Bed Mobility               General bed mobility comments: OOB in chair    Transfers Overall transfer level: Independent Equipment used: None                    Ambulation/Gait Ambulation/Gait assistance: Modified independent  (Device/Increase time) Gait Distance (Feet): 250 Feet Assistive device: Rollator (4 wheels), None Gait Pattern/deviations: WFL(Within Functional Limits)       General Gait Details: Steady pace, light reliance on arms through Rollator. Reaching out with single UE x 1 without AD present  Stairs            Wheelchair Mobility    Modified Rankin (Stroke Patients Only)       Balance Overall balance assessment: No apparent balance deficits (not formally assessed)                                           Pertinent Vitals/Pain Pain Assessment Pain Assessment: Faces Faces Pain Scale: No hurt    Home Living Family/patient expects to be discharged to:: Private residence Living Arrangements: Spouse/significant other Available Help at Discharge: Family;Available PRN/intermittently Type of Home: Mobile home Home Access: Ramped entrance       Home Layout: One level Home Equipment: Other (comment);Shower seat (CPAP; adjustable bed) Additional Comments: pt cares for spouse who is disabled; med mgmt, meals, ADLs    Prior Function Prior Level of Function : Independent/Modified Independent;Driving             Mobility Comments: reports using spouse's rollator intermittently x2 weeks due to dizziness ADLs Comments: ind with ADLs/IADLs     Hand Dominance   Dominant Hand: Right    Extremity/Trunk Assessment   Upper  Extremity Assessment Upper Extremity Assessment: Defer to OT evaluation    Lower Extremity Assessment Lower Extremity Assessment: RLE deficits/detail;LLE deficits/detail RLE Deficits / Details: Strength 5/5 LLE Deficits / Details: Strength 5/5    Cervical / Trunk Assessment Cervical / Trunk Assessment: Normal  Communication   Communication: No difficulties  Cognition Arousal/Alertness: Awake/alert Behavior During Therapy: WFL for tasks assessed/performed Overall Cognitive Status: Within Functional Limits for tasks assessed                                           General Comments      Exercises     Assessment/Plan    PT Assessment Patient does not need any further PT services  PT Problem List         PT Treatment Interventions      PT Goals (Current goals can be found in the Care Plan section)  Acute Rehab PT Goals Patient Stated Goal: go home PT Goal Formulation: All assessment and education complete, DC therapy    Frequency       Co-evaluation               AM-PAC PT "6 Clicks" Mobility  Outcome Measure Help needed turning from your back to your side while in a flat bed without using bedrails?: None Help needed moving from lying on your back to sitting on the side of a flat bed without using bedrails?: None Help needed moving to and from a bed to a chair (including a wheelchair)?: None Help needed standing up from a chair using your arms (e.g., wheelchair or bedside chair)?: None Help needed to walk in hospital room?: None Help needed climbing 3-5 steps with a railing? : None 6 Click Score: 24    End of Session   Activity Tolerance: Patient tolerated treatment well Patient left: in chair;with call bell/phone within reach Nurse Communication: Mobility status PT Visit Diagnosis: Difficulty in walking, not elsewhere classified (R26.2)    Time: 4166-0630 PT Time Calculation (min) (ACUTE ONLY): 13 min   Charges:   PT Evaluation $PT Eval Low Complexity: 1 Low          Lillia Pauls, PT, DPT Acute Rehabilitation Services Office [phone]   Norval Morton 01/23/2022, 2:58 PM

## 2022-01-23 NOTE — TOC Benefit Eligibility Note (Signed)
Patient Product/process development scientist completed.    The patient is currently admitted and upon discharge could be taking Eliquis 5 mg.  The current 30 day co-pay is $23.40.   The patient is insured through [employer] Part D     Roland Earl, CPhT Pharmacy Patient Advocate Specialist [facility] Patient Advocate Team Direct Number: [phone]  Fax: [phone]

## 2022-01-23 NOTE — Evaluation (Signed)
Occupational Therapy Evaluation Patient Details Name: STELLA ENCARNACION MRN: [id number] DOB: [birth year] Today's Date: 01/23/2022   History of Present Illness Pt is a 76 y/o M presenting to ED on 9/9 with SOB, tachycardia, and weakness; admitted for A fib with RVR. PMH includes HTN, HLD, DM, colon CA, and pneumectomy of L lower lobe.   Clinical Impression   Pt independent at baseline with ADLs and functional mobility, reports use of his spouse's rollator x2 weeks due to dizziness. Pt lives with spouse and is her primary caregiver, reports his children also assist with IADLs. Pt currently at a mod I level for ADLs and transfers without AD. Educated pt on energy conservation strategies for home. Pt presenting with impairments listed below, however has no acute OT needs at this time, will s/o. Please reconsult if there is a change in pt status.     Recommendations for follow up therapy are one component of a multi-disciplinary discharge planning process, led by the attending physician.  Recommendations may be updated based on patient status, additional functional criteria and insurance authorization.   Follow Up Recommendations  No OT follow up    Assistance Recommended at Discharge PRN  Patient can return home with the following Direct supervision/assist for medications management;Direct supervision/assist for financial management    Functional Status Assessment  Patient has had a recent decline in their functional status and demonstrates the ability to make significant improvements in function in a reasonable and predictable amount of time.  Equipment Recommendations  None recommended by OT    Recommendations for Other Services PT consult     Precautions / Restrictions Precautions Precautions: None Restrictions Weight Bearing Restrictions: No      Mobility Bed Mobility               General bed mobility comments: OOB in chair    Transfers Overall transfer level:  Independent Equipment used: None                      Balance Overall balance assessment: No apparent balance deficits (not formally assessed)                                         ADL either performed or assessed with clinical judgement   ADL Overall ADL's : At baseline                                       General ADL Comments: mod I for in room ambulation, standing grooming task, up ad lib in room     Vision   Vision Assessment?: No apparent visual deficits     Perception     Praxis      Pertinent Vitals/Pain Pain Assessment Pain Assessment: Faces Pain Score: 2  Faces Pain Scale: Hurts a little bit Pain Location: RUE where IV's were taken out and back pain Pain Descriptors / Indicators: Discomfort Pain Intervention(s): Limited activity within patient's tolerance, Monitored during session     Hand Dominance Right   Extremity/Trunk Assessment Upper Extremity Assessment Upper Extremity Assessment: Overall WFL for tasks assessed (mild R wrist/forearm edema at IV site)   Lower Extremity Assessment Lower Extremity Assessment: Defer to PT evaluation RLE Deficits / Details: Strength 5/5 LLE Deficits / Details: Strength 5/5   Cervical /  Trunk Assessment Cervical / Trunk Assessment: Normal   Communication Communication Communication: No difficulties   Cognition Arousal/Alertness: Awake/alert Behavior During Therapy: WFL for tasks assessed/performed Overall Cognitive Status: Within Functional Limits for tasks assessed                                       General Comments  VSS on RA    Exercises     Shoulder Instructions      Home Living Family/patient expects to be discharged to:: Private residence Living Arrangements: Spouse/significant other Available Help at Discharge: Family;Available PRN/intermittently Type of Home: Mobile home Home Access: Ramped entrance     Home Layout: One level      Bathroom Shower/Tub: Walk-in shower         Home Equipment: Other (comment);Shower seat (CPAP; adjustable bed)   Additional Comments: pt cares for spouse who is disabled; med mgmt, meals, ADLs      Prior Functioning/Environment Prior Level of Function : Independent/Modified Independent;Driving             Mobility Comments: reports using spouse's rollator intermittently x2 weeks due to dizziness ADLs Comments: ind with ADLs/IADLs        OT Problem List: Decreased activity tolerance      OT Treatment/Interventions:      OT Goals(Current goals can be found in the care plan section) Acute Rehab OT Goals Patient Stated Goal: none stated OT Goal Formulation: With patient Time For Goal Achievement: 02/06/22 Potential to Achieve Goals: Good  OT Frequency:      Co-evaluation              AM-PAC OT "6 Clicks" Daily Activity     Outcome Measure Help from another person eating meals?: None Help from another person taking care of personal grooming?: None Help from another person toileting, which includes using toliet, bedpan, or urinal?: None Help from another person bathing (including washing, rinsing, drying)?: None Help from another person to put on and taking off regular upper body clothing?: None Help from another person to put on and taking off regular lower body clothing?: None 6 Click Score: 24   End of Session Nurse Communication: Mobility status  Activity Tolerance: Patient tolerated treatment well Patient left: in chair;with call bell/phone within reach  OT Visit Diagnosis: Unsteadiness on feet (R26.81);Other abnormalities of gait and mobility (R26.89);Muscle weakness (generalized) (M62.81)                Time: 5329-9242 OT Time Calculation (min): 20 min Charges:  OT General Charges $OT Visit: 1 Visit OT Evaluation $OT Eval Low Complexity: 1 Low  Alfonzo Beers, OTD, OTR/L Acute Rehab [phone]) [phone]   Mayer Masker 01/23/2022, 3:47 PM

## 2022-01-23 NOTE — Progress Notes (Signed)
D/c tele and Ivs. Went over AVS with pt and all questions were addressed.  Kollyns Mickelson S Ibrahima Holberg, RN  

## 2022-01-23 NOTE — Discharge Instructions (Signed)

## 2022-01-23 NOTE — Progress Notes (Signed)
Rounding Note    Patient Name: Herbert Evans Date of Encounter: 01/23/2022  [facility] Cardiologist: Thomasene Ripple, DO   Subjective   Patient seen and examined at his bedside. He offers no complaints at this time.  Inpatient Medications    Scheduled Meds:  amiodarone  200 mg Oral Daily   apixaban  5 mg Oral BID   ezetimibe  10 mg Oral Daily   insulin aspart  0-9 Units Subcutaneous TID WC   memantine  5 mg Oral BID   metoprolol succinate  25 mg Oral Daily   sodium chloride flush  3 mL Intravenous Q12H   traZODone  100 mg Oral QHS   Continuous Infusions:  diltiazem (CARDIZEM) infusion 12.5 mg/hr (01/23/22 0827)   PRN Meds: acetaminophen **OR** acetaminophen, albuterol, polyethylene glycol   Vital Signs    Vitals:   01/22/22 2103 01/23/22 0000 01/23/22 0400 01/23/22 0814  BP: 99/69 113/72 (!) 101/51 131/73  Pulse: 71 67 70 (!) 110  Resp: 20 16 20 20   Temp: 98.1 F (36.7 C) 97.9 F (36.6 C) 98.2 F (36.8 C) 98 F (36.7 C)  TempSrc: Oral Oral Oral Oral  SpO2: 95% 94% 93% 93%  Weight:      Height:        Intake/Output Summary (Last 24 hours) at 01/23/2022 0927 Last data filed at 01/22/2022 1622 Gross per 24 hour  Intake 740.62 ml  Output 0 ml  Net 740.62 ml      01/21/2022    1:04 PM 01/21/2022   10:09 AM 01/18/2022    3:48 PM  Last 3 Weights  Weight (lbs) 194 lb 194 lb 194 lb 6.4 oz  Weight (kg) 87.998 kg 87.998 kg 88.179 kg      Telemetry    Atrial fib/flutter - Personally Reviewed  ECG    None - Personally Reviewed  Physical Exam   GEN: No acute distress.   Neck: No JVD Cardiac: RRR, no murmurs, rubs, or gallops.  Respiratory: Clear to auscultation bilaterally. GI: Soft, nontender, non-distended  MS: No edema; No deformity. Neuro:  Nonfocal  Psych: Normal affect   Labs    High Sensitivity Troponin:   Recent Labs  Lab 01/21/22 1027 01/21/22 1350  TROPONINIHS 10 9     Chemistry Recent Labs  Lab 01/21/22 1027  01/22/22 0357 01/23/22 0729  NA 136 137 136  K 4.2 4.1 4.3  CL 102 107 104  CO2 23 23 21*  GLUCOSE 130* 146* 228*  BUN 11 12 20   CREATININE 0.93 1.07 1.10  CALCIUM 8.9 8.8* 9.1  MG 1.8  --   --   PROT  --   --  6.9  ALBUMIN  --   --  3.6  AST  --   --  27  ALT  --   --  43  ALKPHOS  --   --  51  BILITOT  --   --  0.9  GFRNONAA >60 >60 >60  ANIONGAP 11 7 11     Lipids No results for input(s): "CHOL", "TRIG", "HDL", "LABVLDL", "LDLCALC", "CHOLHDL" in the last 168 hours.  Hematology Recent Labs  Lab 01/21/22 1027 01/22/22 0357 01/23/22 0729  WBC 6.7 5.9 9.4  RBC 5.06 4.76 5.15  HGB 15.7 14.9 16.2  HCT 46.2 42.6 46.0  MCV 91.3 89.5 89.3  MCH 31.0 31.3 31.5  MCHC 34.0 35.0 35.2  RDW 13.8 13.8 13.7  PLT 154 150 202   Thyroid  Recent Labs  Lab 01/21/22 1027  TSH 2.097    BNPNo results for input(s): "BNP", "PROBNP" in the last 168 hours.  DDimer No results for input(s): "DDIMER" in the last 168 hours.   Radiology    ECHOCARDIOGRAM COMPLETE  Result Date: 01/22/2022    ECHOCARDIOGRAM REPORT   Patient Name:   Herbert Evans Date of Exam: 01/22/2022 Medical Rec #:  [id number]      Height:       71.0 in Accession #:    [id number]     Weight:       194.0 lb Date of Birth:  [birth year]      BSA:          2.081 m Patient Age:    76 years       BP:           118/71 mmHg Patient Gender: M              HR:           63 bpm. Exam Location:  Inpatient Procedure: 2D Echo, Cardiac Doppler, Color Doppler and Intracardiac            Opacification Agent Indications:    Atrial Fibrillation I48.91  History:        Patient has prior history of Echocardiogram examinations, most                 recent 10/15/2020. Arrythmias:NSVT; Risk Factors:Hypertension,                 Dyslipidemia and Diabetes. Hx of pneumonectomy, HCC.  Sonographer:    Milda Smart Referring Phys: [id number] Cecille Po MELVIN  Sonographer Comments: Technically difficult study due to poor echo windows. Image acquisition  challenging due to patient body habitus. IMPRESSIONS  1. Left ventricular ejection fraction, by estimation, is 40 to 45%. The left ventricle has mildly decreased function. The left ventricle demonstrates global hypokinesis. Left ventricular diastolic function could not be evaluated.  2. Right ventricular systolic function is normal. The right ventricular size is mildly enlarged. There is normal pulmonary artery systolic pressure. The estimated right ventricular systolic pressure is 29.8 mmHg.  3. The mitral valve is grossly normal. Mild to moderate mitral valve regurgitation. No evidence of mitral stenosis.  4. The aortic valve is tricuspid. Aortic valve regurgitation is not visualized. No aortic stenosis is present.  5. The inferior vena cava is normal in size with greater than 50% respiratory variability, suggesting right atrial pressure of 3 mmHg. Comparison(s): Changes from prior study are noted. Afib now present. LVEF ~40-45%. FINDINGS  Left Ventricle: Left ventricular ejection fraction, by estimation, is 40 to 45%. The left ventricle has mildly decreased function. The left ventricle demonstrates global hypokinesis. Definity contrast agent was given IV to delineate the left ventricular  endocardial borders. The left ventricular internal cavity size was normal in size. There is no left ventricular hypertrophy. Left ventricular diastolic function could not be evaluated due to atrial fibrillation. Left ventricular diastolic function could  not be evaluated. Right Ventricle: The right ventricular size is mildly enlarged. No increase in right ventricular wall thickness. Right ventricular systolic function is normal. There is normal pulmonary artery systolic pressure. The tricuspid regurgitant velocity is 2.59  m/s, and with an assumed right atrial pressure of 3 mmHg, the estimated right ventricular systolic pressure is 29.8 mmHg. Left Atrium: Left atrial size was normal in size. Right Atrium: Right atrial size was  normal in size. Pericardium: There is no evidence of  pericardial effusion. Mitral Valve: The mitral valve is grossly normal. Mild to moderate mitral valve regurgitation. No evidence of mitral valve stenosis. Tricuspid Valve: The tricuspid valve is grossly normal. Tricuspid valve regurgitation is trivial. No evidence of tricuspid stenosis. Aortic Valve: The aortic valve is tricuspid. Aortic valve regurgitation is not visualized. No aortic stenosis is present. Pulmonic Valve: The pulmonic valve was grossly normal. Pulmonic valve regurgitation is mild. No evidence of pulmonic stenosis. Aorta: The aortic root and ascending aorta are structurally normal, with no evidence of dilitation. Venous: The inferior vena cava is normal in size with greater than 50% respiratory variability, suggesting right atrial pressure of 3 mmHg. IAS/Shunts: The atrial septum is grossly normal.  LEFT VENTRICLE PLAX 2D LVIDd:         4.80 cm LVIDs:         4.10 cm LV PW:         1.30 cm LV IVS:        1.00 cm LVOT diam:     2.10 cm LVOT Area:     3.46 cm  RIGHT VENTRICLE             IVC RV S prime:     11.20 cm/s  IVC diam: 1.80 cm TAPSE (M-mode): 1.2 cm LEFT ATRIUM             Index        RIGHT ATRIUM           Index LA diam:        4.70 cm 2.26 cm/m   RA Area:     12.60 cm LA Vol (A2C):   49.7 ml 23.88 ml/m  RA Volume:   25.40 ml  12.20 ml/m LA Vol (A4C):   61.9 ml 29.74 ml/m LA Biplane Vol: 56.1 ml 26.95 ml/m                        PULMONIC VALVE AORTA                 PR End Diast Vel: 6.35 msec Ao Root diam: 3.10 cm Ao Asc diam:  3.50 cm TRICUSPID VALVE TR Peak grad:   26.8 mmHg TR Vmax:        259.00 cm/s  SHUNTS Systemic Diam: 2.10 cm Lennie OdorWesley O'Neal MD Electronically signed by Lennie OdorWesley O'Neal MD Signature Date/Time: 01/22/2022/12:01:34 PM    Final    CT Chest Wo Contrast  Result Date: 01/21/2022 CLINICAL DATA:  Palpitations, intermittent shortness of breath, chest pain EXAM: CT CHEST WITHOUT CONTRAST TECHNIQUE: Multidetector CT  imaging of the chest was performed following the standard protocol without IV contrast. RADIATION DOSE REDUCTION: This exam was performed according to the departmental dose-optimization program which includes automated exposure control, adjustment of the mA and/or kV according to patient size and/or use of iterative reconstruction technique. COMPARISON:  Previous CT done on 01/24/2021, chest radiographs done earlier today FINDINGS: Cardiovascular: Coronary artery calcifications are seen. There is ectasia of the main pulmonary artery measuring 3.8 cm suggesting pulmonary arterial hypertension. Mediastinum/Nodes: There is shift of mediastinum to the left suggesting decreased volume in the left lung. Subcentimeter nodes in mediastinum appear stable. Lungs/Pleura: Linear densities in right lower lung field appears stable. There is small focus of increased interstitial markings in the medial right apical region with no significant change. There is decreased volume in the left lung suggesting previous partial resection. In image 48 of series 4, there is 2 cm pleural-based alveolar  density in left upper lung field which measured 11 mm in the previous study. There are other linear patchy density seen right mid and right lower lung fields. There is pleural thickening with pleural calcifications in the posteromedial left lower lung field. There is no pneumothorax. Upper Abdomen: No acute findings are seen. Musculoskeletal: Degenerative changes are noted in thoracic spine with extensive calcifications in the anterior spinal ligament. Please correlate for possible ankylosing spondylitis. IMPRESSION: There is 2 cm focal alveolar density in lateral aspect of left upper lung field suggesting pneumonia. Possibility of underlying neoplastic process is not excluded. Short-term follow-up CT in 3 months may be considered. There are other linear densities in left lower lung field suggesting scarring. There is decreased volume in the  left lung suggesting previous partial resection. Coronary artery disease. There is ectasia of the main pulmonary artery suggesting pulmonary arterial hypertension. Electronically Signed   By: Ernie Avena M.D.   On: 01/21/2022 15:35   DG Chest Port 1 View  Result Date: 01/21/2022 CLINICAL DATA:  76 year old male presenting for evaluation of cough and dyspnea. EXAM: PORTABLE CHEST 1 VIEW COMPARISON:  CT of the chest from September of 2022 and chest x-ray of December 07, 2020 FINDINGS: Trachea midline. Cardiomediastinal contours and hilar structures are stable. 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 EKG leads project over the chest. No lobar consolidative process. Mild increased interstitial markings and suggestion of alveolar opacities developing in the LEFT mid chest. LEFT apical pleural and parenchymal scarring. W on limited assessment no acute skeletal findings. IMPRESSION: Post LEFT lower lobectomy with increasing interstitial and airspace opacities in the LEFT mid chest suspicious for pneumonia. Findings are similar to November 10, 2020. Consider PA and lateral chest radiograph on follow-up to ensure resolution. Electronically Signed   By: Donzetta Kohut M.D.   On: 01/21/2022 14:01    Cardiac Studies   TTE 01/22/2022 IMPRESSIONS     1. Left ventricular ejection fraction, by estimation, is 40 to 45%. The  left ventricle has mildly decreased function. The left ventricle  demonstrates global hypokinesis. Left ventricular diastolic function could  not be evaluated.   2. Right ventricular systolic function is normal. The right ventricular  size is mildly enlarged. There is normal pulmonary artery systolic  pressure. The estimated right ventricular systolic pressure is 29.8 mmHg.   3. The mitral valve is grossly normal. Mild to moderate mitral valve  regurgitation. No evidence of mitral stenosis.   4.  The aortic valve is tricuspid. Aortic valve regurgitation is not  visualized. No aortic stenosis is present.   5. The inferior vena cava is normal in size with greater than 50%  respiratory variability, suggesting right atrial pressure of 3 mmHg.   Comparison(s): Changes from prior study are noted. Afib now present. LVEF  ~40-45%.   FINDINGS   Left Ventricle: Left ventricular ejection fraction, by estimation, is 40  to 45%. The left ventricle has mildly decreased function. The left  ventricle demonstrates global hypokinesis. Definity contrast agent was  given IV to delineate the left ventricular   endocardial borders. The left ventricular internal cavity size was normal  in size. There is no left ventricular hypertrophy. Left ventricular  diastolic function could not be evaluated due to atrial fibrillation. Left  ventricular diastolic function could   not be evaluated.   Right Ventricle: The right ventricular size is mildly enlarged. No  increase in  right ventricular wall thickness. Right ventricular systolic  function is normal. There is normal pulmonary artery systolic pressure.  The tricuspid regurgitant velocity is 2.59   m/s, and with an assumed right atrial pressure of 3 mmHg, the estimated  right ventricular systolic pressure is 29.8 mmHg.   Left Atrium: Left atrial size was normal in size.   Right Atrium: Right atrial size was normal in size.   Pericardium: There is no evidence of pericardial effusion.   Mitral Valve: The mitral valve is grossly normal. Mild to moderate mitral  valve regurgitation. No evidence of mitral valve stenosis.   Tricuspid Valve: The tricuspid valve is grossly normal. Tricuspid valve  regurgitation is trivial. No evidence of tricuspid stenosis.   Aortic Valve: The aortic valve is tricuspid. Aortic valve regurgitation is  not visualized. No aortic stenosis is present.   Pulmonic Valve: The pulmonic valve was grossly normal. Pulmonic valve   regurgitation is mild. No evidence of pulmonic stenosis.   Aorta: The aortic root and ascending aorta are structurally normal, with  no evidence of dilitation.   Venous: The inferior vena cava is normal in size with greater than 50%  respiratory variability, suggesting right atrial pressure of 3 mmHg.   IAS/Shunts: The atrial septum is grossly normal.   Patient Profile     76 y.o. 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  Assessment & Plan    Atrial flutter - heart rate not well controlled today will increase the metoprolol. Also in the setting of the his infection a linear report has been taken.  He will follow-up in the outpatient setting and we can discuss cardioversion at that time if he still is in atrial flutter.  Continue his Eliquis.  Acute systolic and diastolic HF -his EF has dropped compared to 2022 he is not 40 to 45%.  Cardizem has been stopped.  Continue Toprol XL.  Clinically he appears to be euvolemic.  Hyperlipidemia -continue Zetia.  He is not able to afford PCSK9 inhibitors and has had significant intolerance to statins.  From a cardiology standpoint he actually can be discharged home.     For questions or updates, please contact [facility] Please consult [email or web address] for contact info under        Signed, Thomasene Ripple, DO  01/23/2022, 9:27 AM

## 2022-01-24 ENCOUNTER — Telehealth: Payer: Self-pay | Admitting: Internal Medicine

## 2022-01-24 DIAGNOSIS — R9389 Abnormal findings on diagnostic imaging of other specified body structures: Secondary | ICD-10-CM

## 2022-01-24 NOTE — Discharge Summary (Signed)
Physician Discharge Summary   Patient: Herbert Evans MRN: [id number] DOB: 1945/12/05  Admit date:     01/21/2022  Discharge date: 01/23/2022  Discharge Physician: Briant Cedar   PCP: Erskine Emery, NP   Recommendations at discharge:   Follow-up with PCP Follow-up with cardiology Follow-up with pulmonology-we will contact you for repeat CT chest  Discharge Diagnoses: Principal Problem:   Atrial fibrillation with rapid ventricular response (HCC) Active Problems:   Type 2 diabetes mellitus with complication, without long-term current use of insulin (HCC)   Hyperlipidemia LDL goal <100   History of pneumonectomy   Hypertension   NSVT (nonsustained ventricular tachycardia) (HCC)   COVID   Atrial fibrillation with RVR (HCC)   Atypical atrial flutter [facility])   Hospital Course: 76 year old male with history of type 2 diabetes hyperlipidemia colon cancer NSVT hypertension admitted with 10 days of shortness of breath chest pain and increased weakness.  In the ED, noted to be in A-fib with RVR.  He was started on Cardizem drip in the ER.  His troponin was negative.  TSH was normal.  COVID was positive.  CT of the chest shows left upper lobe density suspicious for pneumonia but cannot exclude underlying malignancy.  He has a history of pneumonectomy in 1967 left lower lobe for severe pneumonia at the age of 69. He was admitted for further management.   Assessment and Plan: New onset atrial flutter  S/p Cardizem drip, Toprol  Cardiology consulted, recommend outpatient cardioversion if he is still in atrial flutter after COVID infection has resolved Continue Eliquis   Incidental COVID  Currently on room air, asymptomatic CT chest showed 2 cm focal alveolar density in lateral aspect of left upper lung field suggesting pneumonia. Possibility of underlying neoplastic process is not excluded. Short-term follow-up CT in 3 months may be considered. Monitored off antibiotics Discussed  with pulmonology for possible follow up due to possibility of malignancy  Acute systolic and diastolic HF Echo with drop in EF Appears euvolemic Continue Toprol Follow-up with cardiology  Essential hypertension   Continue PTA meds  Hyperlipidemia  on Zetia and pitavastatin at home(history of multiple statin intolerance)   History of type 2 diabetes on Farxiga, glipizide, metformin prior to admission  Insomnia  continue trazodone  History of colon cancer and colectomy        Pain control - [facility] Controlled Substance Reporting System database was reviewed. and patient was instructed, not to drive, operate heavy machinery, perform activities at heights, swimming or participation in water activities or provide baby-sitting services while on Pain, Sleep and Anxiety Medications; until their outpatient Physician has advised to do so again. Also recommended to not to take more than prescribed Pain, Sleep and Anxiety Medications.    Consultants: Cardiology Procedures performed: None Disposition: Home Diet recommendation:  Cardiac and Carb modified diet DISCHARGE MEDICATION: Allergies as of 01/23/2022       Reactions   Atorvastatin Other (See Comments)   myalgias   Rosuvastatin Other (See Comments)   myalgias        Medication List     TAKE these medications    albuterol 0.63 MG/3ML nebulizer solution Commonly known as: ACCUNEB Take 1 ampule by nebulization every 6 (six) hours as needed for wheezing.   amiodarone 200 MG tablet Commonly known as: PACERONE TAKE 1 TABLET BY MOUTH DAILY.   apixaban 5 MG Tabs tablet Commonly known as: ELIQUIS Take 1 tablet (5 mg total) by mouth 2 (two) times daily.  aspirin EC 81 MG tablet Take 81 mg by mouth every morning.   ezetimibe 10 MG tablet Commonly known as: ZETIA Take 1 tablet by mouth once daily   Farxiga 10 MG Tabs tablet Generic drug: dapagliflozin propanediol Take 10 mg by mouth daily.   glipiZIDE 2.5  MG 24 hr tablet Commonly known as: GLUCOTROL XL Take 2.5 mg by mouth daily.   Livalo 2 MG Tabs Generic drug: Pitavastatin Calcium Take 1 tablet (2 mg total) by mouth daily.   memantine 5 MG tablet Commonly known as: NAMENDA Take 5 mg by mouth 2 (two) times daily.   metFORMIN 1000 MG tablet Commonly known as: GLUCOPHAGE Take 1,000 mg by mouth 2 (two) times daily with a meal.   metoprolol succinate 50 MG 24 hr tablet Commonly known as: Toprol XL Take 1 tablet (50 mg total) by mouth daily. What changed:  medication strength how much to take   montelukast 10 MG tablet Commonly known as: SINGULAIR Take 10 mg by mouth daily.   NON FORMULARY CPAP at bedtime.   traZODone 100 MG tablet Commonly known as: DESYREL Take 150 mg by mouth at bedtime.        Follow-up Information     [facility] Follow up.   Why: (Adapt)- rollator arranged- to be delivered to room prior to discharge Contact information: [address] [place] [address] [phone]         Erskine Emery, NP. Schedule an appointment as soon as possible for a visit in 1 week(s).   Contact information: [address] [place] [address] [phone]         Tobb, Lavona Mound, DO .   Specialty: Cardiology Contact information: [address] [place] [address] [place] [address] [phone]                Discharge Exam: [name] Weights   01/21/22 1304  Weight: 88 kg   General: NAD  Cardiovascular: S1, S2 present Respiratory: CTAB Abdomen: Soft, nontender, nondistended, bowel sounds present Musculoskeletal: No bilateral pedal edema noted Skin: Normal Psychiatry: Normal mood   Condition at discharge: stable  The results of significant diagnostics from this hospitalization (including imaging, microbiology, ancillary and laboratory) are listed below for reference.   Imaging Studies: ECHOCARDIOGRAM COMPLETE  Result Date: 01/22/2022    ECHOCARDIOGRAM REPORT   Patient Name:    Herbert Evans Date of Exam: 01/22/2022 Medical Rec #:  [id number]      Height:       71.0 in Accession #:    [id number]     Weight:       194.0 lb Date of Birth:  [birth year]      BSA:          2.081 m Patient Age:    76 years       BP:           118/71 mmHg Patient Gender: M              HR:           63 bpm. Exam Location:  Inpatient Procedure: 2D Echo, Cardiac Doppler, Color Doppler and Intracardiac            Opacification Agent Indications:    Atrial Fibrillation I48.91  History:        Patient has prior history of Echocardiogram examinations, most                 recent 10/15/2020. Arrythmias:NSVT; Risk  Factors:Hypertension,                 Dyslipidemia and Diabetes. Hx of pneumonectomy, HCC.  Sonographer:    Milda SmartShannon O'Grady Referring Phys: [id number] Cecille PoALEXANDER B MELVIN  Sonographer Comments: Technically difficult study due to poor echo windows. Image acquisition challenging due to patient body habitus. IMPRESSIONS  1. Left ventricular ejection fraction, by estimation, is 40 to 45%. The left ventricle has mildly decreased function. The left ventricle demonstrates global hypokinesis. Left ventricular diastolic function could not be evaluated.  2. Right ventricular systolic function is normal. The right ventricular size is mildly enlarged. There is normal pulmonary artery systolic pressure. The estimated right ventricular systolic pressure is 29.8 mmHg.  3. The mitral valve is grossly normal. Mild to moderate mitral valve regurgitation. No evidence of mitral stenosis.  4. The aortic valve is tricuspid. Aortic valve regurgitation is not visualized. No aortic stenosis is present.  5. The inferior vena cava is normal in size with greater than 50% respiratory variability, suggesting right atrial pressure of 3 mmHg. Comparison(s): Changes from prior study are noted. Afib now present. LVEF ~40-45%. FINDINGS  Left Ventricle: Left ventricular ejection fraction, by estimation, is 40 to 45%. The left ventricle has mildly  decreased function. The left ventricle demonstrates global hypokinesis. Definity contrast agent was given IV to delineate the left ventricular  endocardial borders. The left ventricular internal cavity size was normal in size. There is no left ventricular hypertrophy. Left ventricular diastolic function could not be evaluated due to atrial fibrillation. Left ventricular diastolic function could  not be evaluated. Right Ventricle: The right ventricular size is mildly enlarged. No increase in right ventricular wall thickness. Right ventricular systolic function is normal. There is normal pulmonary artery systolic pressure. The tricuspid regurgitant velocity is 2.59  m/s, and with an assumed right atrial pressure of 3 mmHg, the estimated right ventricular systolic pressure is 29.8 mmHg. Left Atrium: Left atrial size was normal in size. Right Atrium: Right atrial size was normal in size. Pericardium: There is no evidence of pericardial effusion. Mitral Valve: The mitral valve is grossly normal. Mild to moderate mitral valve regurgitation. No evidence of mitral valve stenosis. Tricuspid Valve: The tricuspid valve is grossly normal. Tricuspid valve regurgitation is trivial. No evidence of tricuspid stenosis. Aortic Valve: The aortic valve is tricuspid. Aortic valve regurgitation is not visualized. No aortic stenosis is present. Pulmonic Valve: The pulmonic valve was grossly normal. Pulmonic valve regurgitation is mild. No evidence of pulmonic stenosis. Aorta: The aortic root and ascending aorta are structurally normal, with no evidence of dilitation. Venous: The inferior vena cava is normal in size with greater than 50% respiratory variability, suggesting right atrial pressure of 3 mmHg. IAS/Shunts: The atrial septum is grossly normal.  LEFT VENTRICLE PLAX 2D LVIDd:         4.80 cm LVIDs:         4.10 cm LV PW:         1.30 cm LV IVS:        1.00 cm LVOT diam:     2.10 cm LVOT Area:     3.46 cm  RIGHT VENTRICLE              IVC RV S prime:     11.20 cm/s  IVC diam: 1.80 cm TAPSE (M-mode): 1.2 cm LEFT ATRIUM             Index        RIGHT ATRIUM  Index LA diam:        4.70 cm 2.26 cm/m   RA Area:     12.60 cm LA Vol (A2C):   49.7 ml 23.88 ml/m  RA Volume:   25.40 ml  12.20 ml/m LA Vol (A4C):   61.9 ml 29.74 ml/m LA Biplane Vol: 56.1 ml 26.95 ml/m                        PULMONIC VALVE AORTA                 PR End Diast Vel: 6.35 msec Ao Root diam: 3.10 cm Ao Asc diam:  3.50 cm TRICUSPID VALVE TR Peak grad:   26.8 mmHg TR Vmax:        259.00 cm/s  SHUNTS Systemic Diam: 2.10 cm Lennie Odor MD Electronically signed by Lennie Odor MD Signature Date/Time: 01/22/2022/12:01:34 PM    Final    CT Chest Wo Contrast  Result Date: 01/21/2022 CLINICAL DATA:  Palpitations, intermittent shortness of breath, chest pain EXAM: CT CHEST WITHOUT CONTRAST TECHNIQUE: Multidetector CT imaging of the chest was performed following the standard protocol without IV contrast. RADIATION DOSE REDUCTION: This exam was performed according to the departmental dose-optimization program which includes automated exposure control, adjustment of the mA and/or kV according to patient size and/or use of iterative reconstruction technique. COMPARISON:  Previous CT done on 01/24/2021, chest radiographs done earlier today FINDINGS: Cardiovascular: Coronary artery calcifications are seen. There is ectasia of the main pulmonary artery measuring 3.8 cm suggesting pulmonary arterial hypertension. Mediastinum/Nodes: There is shift of mediastinum to the left suggesting decreased volume in the left lung. Subcentimeter nodes in mediastinum appear stable. Lungs/Pleura: Linear densities in right lower lung field appears stable. There is small focus of increased interstitial markings in the medial right apical region with no significant change. There is decreased volume in the left lung suggesting previous partial resection. In image 48 of series 4, there is 2 cm  pleural-based alveolar density in left upper lung field which measured 11 mm in the previous study. There are other linear patchy density seen right mid and right lower lung fields. There is pleural thickening with pleural calcifications in the posteromedial left lower lung field. There is no pneumothorax. Upper Abdomen: No acute findings are seen. Musculoskeletal: Degenerative changes are noted in thoracic spine with extensive calcifications in the anterior spinal ligament. Please correlate for possible ankylosing spondylitis. IMPRESSION: There is 2 cm focal alveolar density in lateral aspect of left upper lung field suggesting pneumonia. Possibility of underlying neoplastic process is not excluded. Short-term follow-up CT in 3 months may be considered. There are other linear densities in left lower lung field suggesting scarring. There is decreased volume in the left lung suggesting previous partial resection. Coronary artery disease. There is ectasia of the main pulmonary artery suggesting pulmonary arterial hypertension. Electronically Signed   By: Ernie Avena M.D.   On: 01/21/2022 15:35   DG Chest Port 1 View  Result Date: 01/21/2022 CLINICAL DATA:  76 year old male presenting for evaluation of cough and dyspnea. EXAM: PORTABLE CHEST 1 VIEW COMPARISON:  CT of the chest from September of 2022 and chest x-ray of December 07, 2020 FINDINGS: Trachea midline. Cardiomediastinal contours and hilar structures are stable. 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 EKG leads project over the chest. No lobar consolidative process. Mild increased interstitial markings  and suggestion of alveolar opacities developing in the LEFT mid chest. LEFT apical pleural and parenchymal scarring. W on limited assessment no acute skeletal findings. IMPRESSION: Post LEFT lower lobectomy with increasing interstitial and airspace  opacities in the LEFT mid chest suspicious for pneumonia. Findings are similar to November 10, 2020. Consider PA and lateral chest radiograph on follow-up to ensure resolution. Electronically Signed   By: Donzetta Kohut M.D.   On: 01/21/2022 14:01    Microbiology: Results for orders placed or performed during the hospital encounter of 01/21/22  SARS Coronavirus 2 by RT PCR (hospital order, performed in [facility] hospital lab) *cepheid single result test* Anterior Nasal Swab     Status: Abnormal   Collection Time: 01/21/22  1:38 PM   Specimen: Anterior Nasal Swab  Result Value Ref Range Status   SARS Coronavirus 2 by RT PCR POSITIVE (A) NEGATIVE Final    Comment: (NOTE) SARS-CoV-2 target nucleic acids are DETECTED  SARS-CoV-2 RNA is generally detectable in upper respiratory specimens  during the acute phase of infection.  Positive results are indicative  of the presence of the identified virus, but do not rule out bacterial infection or co-infection with other pathogens not detected by the test.  Clinical correlation with patient history and  other diagnostic information is necessary to determine patient infection status.  The expected result is negative.  Fact Sheet for Patients:   RoadLapTop.co.za   Fact Sheet for Healthcare Providers:   http://kim-miller.com/    This test is not yet approved or cleared by the [place] FDA and  has been authorized for detection and/or diagnosis of SARS-CoV-2 by FDA under an Emergency Use Authorization (EUA).  This EUA will remain in effect (meaning this test can be used) for the duration of  the COVID-19 declaration under Section 564(b)(1)  of the Act, 21 U.S.C. section 360-bbb-3(b)(1), unless the authorization is terminated or revoked sooner.   Performed at [facility], [address], [place], [place] [address]     Labs: CBC: Recent Labs  Lab 01/21/22 1027 01/22/22 0357  01/23/22 0729  WBC 6.7 5.9 9.4  NEUTROABS 4.4  --   --   HGB 15.7 14.9 16.2  HCT 46.2 42.6 46.0  MCV 91.3 89.5 89.3  PLT 154 150 202   Basic Metabolic Panel: Recent Labs  Lab 01/21/22 1027 01/22/22 0357 01/23/22 0729  NA 136 137 136  K 4.2 4.1 4.3  CL 102 107 104  CO2 23 23 21*  GLUCOSE 130* 146* 228*  BUN 11 12 20   CREATININE 0.93 1.07 1.10  CALCIUM 8.9 8.8* 9.1  MG 1.8  --   --    Liver Function Tests: Recent Labs  Lab 01/23/22 0729  AST 27  ALT 43  ALKPHOS 51  BILITOT 0.9  PROT 6.9  ALBUMIN 3.6   CBG: Recent Labs  Lab 01/22/22 1117 01/22/22 1617 01/22/22 2102 01/23/22 0629 01/23/22 1135  GLUCAP 163* 117* 164* 175* 267*    Discharge time spent: less than 30 minutes.  Signed: 01/25/22, MD [facility] 01/24/2022

## 2022-01-25 NOTE — Telephone Encounter (Signed)
LMTCB

## 2022-01-28 ENCOUNTER — Ambulatory Visit: Payer: Medicare Other | Admitting: Cardiology

## 2022-02-01 ENCOUNTER — Other Ambulatory Visit (HOSPITAL_BASED_OUTPATIENT_CLINIC_OR_DEPARTMENT_OTHER): Payer: Medicare Other

## 2022-02-01 NOTE — Telephone Encounter (Signed)
Spoke with pt who stated he sees a pulmonologist in [place]. Closing referral.

## 2022-02-25 ENCOUNTER — Other Ambulatory Visit: Payer: Self-pay

## 2022-02-25 ENCOUNTER — Telehealth: Payer: Self-pay | Admitting: Cardiology

## 2022-02-25 MED ORDER — APIXABAN 5 MG PO TABS: 5.0000 mg | ORAL_TABLET | Freq: Two times a day (BID) | ORAL | 1 refills | Status: DC

## 2022-02-25 NOTE — Telephone Encounter (Signed)
Prescription refill request for Eliquis received. Indication:Afib Last office visit:9/23 Scr:1.1 Age: 76 Weight:88 kg  Prescription refilled

## 2022-02-25 NOTE — Telephone Encounter (Signed)
*  STAT* If patient is at the pharmacy, call can be transferred to refill team.   1. Which medications need to be refilled? (please list name of each medication and dose if known) apixaban (ELIQUIS) 5 MG TABS tablet (Expired)  2. Which pharmacy/location (including street and city if local pharmacy) is medication to be sent to? [place], [place] [address]  3. Do they need a 30 day or 90 day supply? [place]

## 2022-03-04 ENCOUNTER — Ambulatory Visit: Payer: Medicare Other | Attending: Cardiology

## 2022-03-04 ENCOUNTER — Ambulatory Visit: Payer: Medicare Other | Attending: Cardiology | Admitting: Cardiology

## 2022-03-04 ENCOUNTER — Encounter: Payer: Self-pay | Admitting: Cardiology

## 2022-03-04 VITALS — BP 112/84 | HR 95 | Ht 70.0 in | Wt 198.0 lb

## 2022-03-04 DIAGNOSIS — Z79899 Other long term (current) drug therapy: Secondary | ICD-10-CM | POA: Diagnosis not present

## 2022-03-04 DIAGNOSIS — I480 Paroxysmal atrial fibrillation: Secondary | ICD-10-CM

## 2022-03-04 DIAGNOSIS — R0989 Other specified symptoms and signs involving the circulatory and respiratory systems: Secondary | ICD-10-CM

## 2022-03-04 DIAGNOSIS — I340 Nonrheumatic mitral (valve) insufficiency: Secondary | ICD-10-CM | POA: Diagnosis not present

## 2022-03-04 DIAGNOSIS — I4729 Other ventricular tachycardia: Secondary | ICD-10-CM

## 2022-03-04 DIAGNOSIS — R5383 Other fatigue: Secondary | ICD-10-CM

## 2022-03-04 NOTE — Patient Instructions (Signed)
Medication Instructions:  Your physician recommends that you continue on your current medications as directed. Please refer to the Current Medication list given to you today.  *If you need a refill on your cardiac medications before your next appointment, please call your pharmacy*   Lab Work: Your physician recommends that you have the following labs drawn today: CMET, Magnesium and CBC  If you have labs (blood work) drawn today and your tests are completely normal, you will receive your results only by: MyChart Message (if you have MyChart) OR A paper copy in the mail If you have any lab test that is abnormal or we need to change your treatment, we will call you to review the results.   Testing/Procedures: Christena Deem- Long Term Monitor Instructions  Your physician has requested you wear a ZIO patch monitor for 14 days.  This is a single patch monitor. [employer] supplies one patch monitor per enrollment. Additional stickers are not available. Please do not apply patch if you will be having a Nuclear Stress Test,  Echocardiogram, Cardiac CT, MRI, or Chest Xray during the period you would be wearing the  monitor. The patch cannot be worn during these tests. You cannot remove and re-apply the  ZIO XT patch monitor.  Your ZIO patch monitor will be mailed 3 day USPS to your address on file. It may take 3-5 days  to receive your monitor after you have been enrolled.  Once you have received your monitor, please review the enclosed instructions. Your monitor  has already been registered assigning a specific monitor serial # to you.  Billing and Patient Assistance Program Information  We have supplied [employer] with any of your insurance information on file for billing purposes. [employer] offers a sliding scale Patient Assistance Program for patients that do not have  insurance, or whose insurance does not completely cover the cost of the ZIO monitor.  You must apply for the Patient Assistance Program  to qualify for this discounted rate.  To apply, please call [employer] at [phone], select option 4, select option 2, ask to apply for  Patient Assistance Program. Meredeth Ide will ask your household income, and how many people  are in your household. They will quote your out-of-pocket cost based on that information.  [employer] will also be able to set up a 68-month, interest-free payment plan if needed.  Applying the monitor   Shave hair from upper left chest.  Hold abrader disc by orange tab. Rub abrader in 40 strokes over the upper left chest as  indicated in your monitor instructions.  Clean area with 4 enclosed alcohol pads. Let dry.  Apply patch as indicated in monitor instructions. Patch will be placed under collarbone on left  side of chest with arrow pointing upward.  Rub patch adhesive wings for 2 minutes. Remove white label marked "1". Remove the white  label marked "2". Rub patch adhesive wings for 2 additional minutes.  While looking in a mirror, press and release button in center of patch. A small green light will  flash 3-4 times. This will be your only indicator that the monitor has been turned on.  Do not shower for the first 24 hours. You may shower after the first 24 hours.  Press the button if you feel a symptom. You will hear a small click. Record Date, Time and  Symptom in the Patient Logbook.  When you are ready to remove the patch, follow instructions on the last 2 pages of Patient  Logbook.  Stick patch monitor onto the last page of Patient Logbook.  Place Patient Logbook in the blue and white box. Use locking tab on box and tape box closed  securely. The blue and white box has prepaid postage on it. Please place it in the mailbox as  soon as possible. Your physician should have your test results approximately 7 days after the  monitor has been mailed back to [facility].  Call [place] at [phone] if you have questions regarding  your ZIO XT  patch monitor. Call them immediately if you see an orange light blinking on your  monitor.  If your monitor falls off in less than 4 days, contact our Monitor department at [phone].  If your monitor becomes loose or falls off after 4 days call [employer] at [phone] for  suggestions on securing your monitor    Follow-Up: At [facility], you and your health needs are our priority.  As part of our continuing mission to provide you with exceptional heart care, we have created designated Provider Care Teams.  These Care Teams include your primary Cardiologist (physician) and Advanced Practice Providers (APPs -  Physician Assistants and Nurse Practitioners) who all work together to provide you with the care you need, when you need it.  We recommend signing up for the patient portal called "MyChart".  Sign up information is provided on this After Visit Summary.  MyChart is used to connect with patients for Virtual Visits (Telemedicine).  Patients are able to view lab/test results, encounter notes, upcoming appointments, etc.  Non-urgent messages can be sent to your provider as well.   To learn more about what you can do with MyChart, go to [email or web address].    Your next appointment:   6 month(s)  The format for your next appointment:   In Person  Provider:   Berniece Salines, DO

## 2022-03-04 NOTE — Progress Notes (Unsigned)
Enrolled for [employer] to mail a ZIO XT long term holter monitor to the patients address on file.  

## 2022-03-04 NOTE — Progress Notes (Signed)
Cardiology Office Note:    Date:  03/04/2022   ID:  Herbert Evans, DOB [birth year], MRN [id number]  PCP:  Erskine Emery, NP  Cardiologist:  Thomasene Ripple, DO  Electrophysiologist:  None   Referring MD: Erskine Emery, NP   "I am really tired"  History of Present Illness:    Herbert Evans is a 76 y.o. male with a hx of    hyperlipidemia statin intolerant history of ventricular tachycardia on beta-blocker, Type 2 Diabetes, history of colon cancer, recently diagnosed atrial fibrillation when he was admitted at [facility] for COVID-19 infection, with drop in EF to 40 to 45%.   I saw the patient in April 2021 at that time we discussed medication choices given the fact that he was intolerant to statin but could not afford PCSK9 inhibitors.  I started the patient on Pitvastatin and zetia.   I saw the patient on July 2021 the patient experiences some shortness of breath I ordered an echocardiogram to reassess his LV function as well as review his cardiac catheterization from 2018 which was normal.  We discussed a follow-up for 3 months unfortunately the patient did not follow-up.   I saw the patient on November 15, 2020 at that time he reported he had been experiencing some palpitation shortness of breath the office to monitor the patient when he did wear in the interim which showed episodes of NSVT, occasional PVCs as well as paroxysmal atrial tachycardia.  First we increase his beta-blocker but the patient called reporting that he has been still experiencing increasing heart rate.  In the interim I started the patient on amiodarone for 400 mg 7 days a day and then to convert to 200 mg daily.  In addition during his last visit we also reviewed his nuclear stress test which was done at [facility] showing normal nuclear stress test-with no evidence of ischemia.  I saw the patient on December 13, 2021 at that time he was doing well he had responded favorably to the amiodarone.   I did see  the patient in May 2023, since that time he has also seen one of our APP's for dyspnea on exertion during that visit his EKG was suspicious for atrial tachycardia versus atrial flutter he was started on Toprol XL 12.5.  In the meantime he had been hospitalized and was noted to be in atrial fibrillation.  Work-up was continued, he was started on anticoagulation with Eliquis.  His echo during that ED visit also did not show depressed ejection fraction.  He is here today for follow-up visit, he tells me that he has been significantly fatigued.  Not a lot of energy.  No lightheadedness no dizziness.  He is here with his wife today.   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    DOE (dyspnea on exertion)    History of colon cancer    03-01-2005   s/p  sigmoid colectomy w/ negative nodes  (07-27-2019  per pt last colonoscopy 2019 , no recurrence)   History of Helicobacter pylori infection    09/ 2006   History of kidney stones    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 - severe PNA age 12   Hypertension    Mixed hyperlipidemia  NSVT (nonsustained ventricular tachycardia) (HCC)    Paroxysmal ventricular tachycardia (HCC)    PAT (paroxysmal atrial tachycardia)    PNA (pneumonia) 10/13/2020   Pneumonia    PVC's (premature ventricular contractions)    RBBB (right bundle branch block)    Right ureteral stone    Type 2 diabetes mellitus (HCC)    followed by pcp --- (07-27-2019  checks blood sugar three times per week in am,  fasting sugar-- 125-160)   Wears dentures    upper    Past Surgical History:  Procedure Laterality Date   CARDIAC CATHETERIZATION  02-09-2002   dr Sharyn Lull     ef 50-55% w/ 10-15% LAD   CARDIAC CATHETERIZATION  06-28-2016   dr Beverely Pace    for arrhythmia)--- normal coronaries, mild focal wall motion abnormallity of distal  inferapical wall,  severe right innominate / subclavin tortuosity   CATARACT EXTRACTION W/ INTRAOCULAR LENS  IMPLANT, BILATERAL  2018   COLON RESECTION SIGMOID  03-01-2005      CYSTOSCOPY WITH RETROGRADE PYELOGRAM, URETEROSCOPY AND STENT PLACEMENT Right 08/03/2019   Procedure: CYSTOSCOPY WITH RIGHT  RETROGRADE PYELOGRAM, URETEROSCOPY HOLMIUMM LASER AND STENT PLACEMENT;  Surgeon: Bjorn Pippin, MD;  Location: [facility];  Service: Urology;  Laterality: Right;   CYSTOSCOPY/RETROGRADE/URETEROSCOPY/STONE EXTRACTION WITH BASKET  1970s   LUNG REMOVAL, PARTIAL Left 1967   lower lobe for pneumonia per pt   TONSILLECTOMY AND ADENOIDECTOMY  child    Current Medications: Current Meds  Medication Sig   albuterol (ACCUNEB) 0.63 MG/3ML nebulizer solution Take 1 ampule by nebulization every 6 (six) hours as needed for wheezing.   amiodarone (PACERONE) 200 MG tablet TAKE 1 TABLET BY MOUTH DAILY.   apixaban (ELIQUIS) 5 MG TABS tablet Take 1 tablet (5 mg total) by mouth 2 (two) times daily.   aspirin EC 81 MG tablet Take 81 mg by mouth every morning.   ezetimibe (ZETIA) 10 MG tablet Take 1 tablet by mouth once daily   glipiZIDE (GLUCOTROL XL) 2.5 MG 24 hr tablet Take 2.5 mg by mouth daily.   memantine (NAMENDA) 5 MG tablet Take 5 mg by mouth 2 (two) times daily.   metFORMIN (GLUCOPHAGE) 1000 MG tablet Take 1,000 mg by mouth 2 (two) times daily with a meal.    montelukast (SINGULAIR) 10 MG tablet Take 10 mg by mouth daily.   NON FORMULARY CPAP at bedtime.   Pitavastatin Calcium (LIVALO) 2 MG TABS Take 1 tablet (2 mg total) by mouth daily.   traZODone (DESYREL) 100 MG tablet Take 150 mg by mouth at bedtime.     Allergies:   Atorvastatin and Rosuvastatin   Social History   Socioeconomic History   Marital status: Married    Spouse name: Not on file   Number of children: Not on file   Years of education: Not on file   Highest education level: Not on file  Occupational History   Not on  file  Tobacco Use   Smoking status: Former    Types: Cigars   Smokeless tobacco: Current    Types: Snuff, Chew   Tobacco comments:    01/26/20 dips still last cigar was in 1976  Substance and Sexual Activity   Alcohol use: No   Drug use: Never   Sexual activity: Not on file  Other Topics Concern   Not on file  Social History Narrative   Not on file   Social Determinants of Health   Financial Resource Strain: Not on file  Food  Insecurity: Not on file  Transportation Needs: Not on file  Physical Activity: Not on file  Stress: Not on file  Social Connections: Not on file     Family History: The patient's family history includes Asthma in his mother; Heart attack in his mother; Lung cancer in his sister, sister, and sister; Stroke in his father.  ROS:   Review of Systems  Constitution: Negative for decreased appetite, fever and weight gain.  HENT: Negative for congestion, ear discharge, hoarse voice and sore throat.   Eyes: Negative for discharge, redness, vision loss in right eye and visual halos.  Cardiovascular: Negative for chest pain, dyspnea on exertion, leg swelling, orthopnea and palpitations.  Respiratory: Negative for cough, hemoptysis, shortness of breath and snoring.   Endocrine: Negative for heat intolerance and polyphagia.  Hematologic/Lymphatic: Negative for bleeding problem. Does not bruise/bleed easily.  Skin: Negative for flushing, nail changes, rash and suspicious lesions.  Musculoskeletal: Negative for arthritis, joint pain, muscle cramps, myalgias, neck pain and stiffness.  Gastrointestinal: Negative for abdominal pain, bowel incontinence, diarrhea and excessive appetite.  Genitourinary: Negative for decreased libido, genital sores and incomplete emptying.  Neurological: Negative for brief paralysis, focal weakness, headaches and loss of balance.  Psychiatric/Behavioral: Negative for altered mental status, depression and suicidal ideas.   Allergic/Immunologic: Negative for HIV exposure and persistent infections.    EKGs/Labs/Other Studies Reviewed:    The following studies were reviewed today:   EKG: Suspect sinus with underlying right bundle branch block   TTE 01/22/2022 FINDINGS   Left Ventricle: Left ventricular ejection fraction, by estimation, is 40 to 45%. The left ventricle has mildly decreased function. The left  ventricle demonstrates global hypokinesis. Definity contrast agent was given IV to delineate the left ventricular   endocardial borders. The left ventricular internal cavity size was normal in size. There is no left ventricular hypertrophy. Left ventricular  diastolic function could not be evaluated due to atrial fibrillation. Left ventricular diastolic function could  not be evaluated.   Right Ventricle: The right ventricular size is mildly enlarged. No increase in right ventricular wall thickness. Right ventricular systolic  function is normal. There is normal pulmonary artery systolic pressure.  The tricuspid regurgitant velocity is 2.59   m/s, and with an assumed right atrial pressure of 3 mmHg, the estimated right ventricular systolic pressure is 14.4 mmHg.   Left Atrium: Left atrial size was normal in size.   Right Atrium: Right atrial size was normal in size.   Pericardium: There is no evidence of pericardial effusion.   Mitral Valve: The mitral valve is grossly normal. Mild to moderate mitral valve regurgitation. No evidence of mitral valve stenosis.   Tricuspid Valve: The tricuspid valve is grossly normal. Tricuspid valve  regurgitation is trivial. No evidence of tricuspid stenosis.   Aortic Valve: The aortic valve is tricuspid. Aortic valve regurgitation is  not visualized. No aortic stenosis is present.   Pulmonic Valve: The pulmonic valve was grossly normal. Pulmonic valve  regurgitation is mild. No evidence of pulmonic stenosis.   Aorta: The aortic root and ascending aorta are  structurally normal, with  no evidence of dilitation.   Venous: The inferior vena cava is normal in size with greater than 50%  respiratory variability, suggesting right atrial pressure of 3 mmHg.   IAS/Shunts: The atrial septum is grossly normal.   TTE 12/13/2020 Zio monitor Patch Wear Time:  13 days and 23 hours November 20, 2020 Indication: Palpitations   patient had a min HR of  49 bpm, max HR of 200 bpm, and avg HR of 67 bpm. Predominant underlying rhythm was Sinus Rhythm.   21 Ventricular Tachycardia runs occurred, the run with the fastest interval lasting 5 beats with a max rate of 179 bpm, the longest lasting 6 beats with an avg rate of 145 bpm.   62 Supraventricular Tachycardia runs occurred, the run with the fastest interval lasting 1 min 33 secs with a max rate of 200 bpm, the longest lasting 2 hours 38 mins with an avg rate of 154 bpm.     Premature atrial complexes were rare (<1.0%). Premature ventricular complexes were occasional (4.7%, 55601). Ventricular Bigeminy and Trigeminy were present.   Symptoms associated with this supraventricular tachycardia, and occasional premature ventricular complexes.   Conclusion: This study is remarkable for                                              1.  Several episodes of nonsustained ventricular tachycardia                                             2.  Paroxysmal supraventricular tachycardia which is likely atrial tachycardia with variable block                                             3.  Occasional premature ventricular complex       TTE 10/15/2020 IMPRESSIONS   1. Left ventricular ejection fraction, by estimation, is 55 to 60%. The left ventricle has normal function. The left ventricle has no regional  wall motion abnormalities. Left ventricular diastolic parameters are consistent with Grade I diastolic  dysfunction (impaired relaxation).   2. Right ventricular systolic function is normal. The right ventricular size is mildly  enlarged. There is mildly elevated pulmonary artery systolic pressure. The estimated right ventricular systolic pressure is 37.3 mmHg.   3. Left atrial size was mildly dilated.   4. The mitral valve is grossly normal. Mild to possibly moderate, eccentric mitral valve regurgitation.   5. The aortic valve is tricuspid. There is mild calcification of the  aortic valve. Aortic valve regurgitation is trivial.   6. The inferior vena cava is normal in size with greater than 50%  respiratory variability, suggesting right atrial pressure of 3 mmHg.   FINDINGS   Left Ventricle: Left ventricular ejection fraction, by estimation, is 55 to 60%. The left ventricle has normal function. The left ventricle has no regional wall motion abnormalities. The left ventricular internal cavity size was normal in size. There is  no left ventricular hypertrophy. Left ventricular diastolic parameters are consistent with Grade I diastolic dysfunction (impaired relaxation).   Right Ventricle: The right ventricular size is mildly enlarged. No increase in right ventricular wall thickness. Right ventricular systolic function is normal. There is mildly elevated pulmonary artery systolic pressure. The tricuspid regurgitant velocity is 2.93 m/s, and with an assumed right atrial pressure of 3 mmHg, the estimated right ventricular systolic pressure is 37.3 mmHg.   Left Atrium: Left atrial size was mildly dilated.   Right Atrium: Right atrial size was normal in size.   Pericardium:  There is no evidence of pericardial effusion.   Mitral Valve: The mitral valve is grossly normal. Mild mitral valve regurgitation.   Tricuspid Valve: The tricuspid valve is grossly normal. Tricuspid valve regurgitation is trivial.   Aortic Valve: The aortic valve is tricuspid. There is mild calcification  of the aortic valve. There is moderate aortic valve annular calcification. Aortic valve regurgitation is trivial.   Pulmonic Valve: The pulmonic  valve was grossly normal. Pulmonic valve regurgitation is trivial.   Aorta: The aortic root is normal in size and structure.   Venous: The inferior vena cava is normal in size with greater than 50% respiratory variability, suggesting right atrial pressure of 3 mmHg.   IAS/Shunts: No atrial level shunt detected by color flow Doppler.    Recent Labs: 01/21/2022: Magnesium 1.8; TSH 2.097 01/23/2022: ALT 43; BUN 20; Creatinine, Ser 1.10; Hemoglobin 16.2; Platelets 202; Potassium 4.3; Sodium 136  Recent Lipid Panel    Component Value Date/Time   CHOL 232 (H) 12/11/2020 0929   TRIG 116 12/11/2020 0929   HDL 66 12/11/2020 0929   CHOLHDL 3.5 12/11/2020 0929   LDLCALC 146 (H) 12/11/2020 0929    Physical Exam:    VS:  BP 112/84   Pulse 95   Ht 5\' 10"  (1.778 m)   Wt 198 lb (89.8 kg)   SpO2 98%   BMI 28.41 kg/m     Wt Readings from Last 3 Encounters:  03/04/22 198 lb (89.8 kg)  01/21/22 194 lb (88 kg)  01/21/22 194 lb (88 kg)     GEN: Well nourished, well developed in no acute distress HEENT: Normal NECK: No JVD; No carotid bruits LYMPHATICS: No lymphadenopathy CARDIAC: S1S2 noted,RRR, no murmurs, rubs, gallops RESPIRATORY:  Clear to auscultation without rales, wheezing or rhonchi  ABDOMEN: Soft, non-tender, non-distended, +bowel sounds, no guarding. EXTREMITIES: No edema, No cyanosis, no clubbing MUSCULOSKELETAL:  No deformity  SKIN: Warm and dry NEUROLOGIC:  Alert and oriented x 3, non-focal PSYCHIATRIC:  Normal affect, good insight  ASSESSMENT:    1. Medication management   2. Paroxysmal ventricular tachycardia (HCC)   3. PAF (paroxysmal atrial fibrillation) (HCC)   4. NSVT (nonsustained ventricular tachycardia) (HCC)   5. Nonrheumatic mitral valve regurgitation   6. Depressed left ventricular ejection fraction   7. Other fatigue     PLAN:    He is experiencing great deal of fatigue, I will like to do a few things first with like to place a monitor on the patient  to understand burden of atrial fibrillation or if he is going to flutter.  In addition he had significant SVT and PSVT on previous monitor.  Once we understand his burden my plan would be to optimize his antiarrhythmics and refer the patient to EP.   The patient is in agreement with the above plan. The patient left the office in stable condition.  The patient will follow up in 1 year or sooner if needed.   Medication Adjustments/Labs and Tests Ordered: Current medicines are reviewed at length with the patient today.  Concerns regarding medicines are outlined above.  Orders Placed This Encounter  Procedures   Comprehensive metabolic panel   Magnesium   CBC   LONG TERM MONITOR (3-14 DAYS)   EKG 12-Lead   No orders of the defined types were placed in this encounter.   Patient Instructions  Medication Instructions:  Your physician recommends that you continue on your current medications as directed. Please refer to the Current Medication list  given to you today.  *If you need a refill on your cardiac medications before your next appointment, please call your pharmacy*   Lab Work: Your physician recommends that you have the following labs drawn today: CMET, Magnesium and CBC  If you have labs (blood work) drawn today and your tests are completely normal, you will receive your results only by: MyChart Message (if you have MyChart) OR A paper copy in the mail If you have any lab test that is abnormal or we need to change your treatment, we will call you to review the results.   Testing/Procedures: Christena DeemZIO XT- Long Term Monitor Instructions  Your physician has requested you wear a ZIO patch monitor for 14 days.  This is a single patch monitor. [employer] supplies one patch monitor per enrollment. Additional stickers are not available. Please do not apply patch if you will be having a Nuclear Stress Test,  Echocardiogram, Cardiac CT, MRI, or Chest Xray during the period you would be wearing  the  monitor. The patch cannot be worn during these tests. You cannot remove and re-apply the  ZIO XT patch monitor.  Your ZIO patch monitor will be mailed 3 day USPS to your address on file. It may take 3-5 days  to receive your monitor after you have been enrolled.  Once you have received your monitor, please review the enclosed instructions. Your monitor  has already been registered assigning a specific monitor serial # to you.  Billing and Patient Assistance Program Information  We have supplied [employer] with any of your insurance information on file for billing purposes. [employer] offers a sliding scale Patient Assistance Program for patients that do not have  insurance, or whose insurance does not completely cover the cost of the ZIO monitor.  You must apply for the Patient Assistance Program to qualify for this discounted rate.  To apply, please call [employer] at [phone], select option 4, select option 2, ask to apply for  Patient Assistance Program. Meredeth [employer] will ask your household income, and how many people  are in your household. They will quote your out-of-pocket cost based on that information.  [employer] will also be able to set up a 4050-month, interest-free payment plan if needed.  Applying the monitor   Shave hair from upper left chest.  Hold abrader disc by orange tab. Rub abrader in 40 strokes over the upper left chest as  indicated in your monitor instructions.  Clean area with 4 enclosed alcohol pads. Let dry.  Apply patch as indicated in monitor instructions. Patch will be placed under collarbone on left  side of chest with arrow pointing upward.  Rub patch adhesive wings for 2 minutes. Remove white label marked "1". Remove the white  label marked "2". Rub patch adhesive wings for 2 additional minutes.  While looking in a mirror, press and release button in center of patch. A small green light will  flash 3-4 times. This will be your only indicator that the monitor has  been turned on.  Do not shower for the first 24 hours. You may shower after the first 24 hours.  Press the button if you feel a symptom. You will hear a small click. Record Date, Time and  Symptom in the Patient Logbook.  When you are ready to remove the patch, follow instructions on the last 2 pages of Patient  Logbook. Stick patch monitor onto the last page of Patient Logbook.  Place Patient Logbook in the blue and white box. Use  locking tab on box and tape box closed  securely. The blue and white box has prepaid postage on it. Please place it in the mailbox as  soon as possible. Your physician should have your test results approximately 7 days after the  monitor has been mailed back to [facility].  Call [facility] Customer Care at [phone] if you have questions regarding  your ZIO XT patch monitor. Call them immediately if you see an orange light blinking on your  monitor.  If your monitor falls off in less than 4 days, contact our Monitor department at [phone].  If your monitor becomes loose or falls off after 4 days call [employer] at [phone] for  suggestions on securing your monitor    Follow-Up: At [facility], you and your health needs are our priority.  As part of our continuing mission to provide you with exceptional heart care, we have created designated Provider Care Teams.  These Care Teams include your primary Cardiologist (physician) and Advanced Practice Providers (APPs -  Physician Assistants and Nurse Practitioners) who all work together to provide you with the care you need, when you need it.  We recommend signing up for the patient portal called "MyChart".  Sign up information is provided on this After Visit Summary.  MyChart is used to connect with patients for Virtual Visits (Telemedicine).  Patients are able to view lab/test results, encounter notes, upcoming appointments, etc.  Non-urgent messages can be sent to your provider as well.   To  learn more about what you can do with MyChart, go to [email or web address].    Your next appointment:   6 month(s)  The format for your next appointment:   In Person  Provider:   Thomasene Ripple, DO     Adopting a Healthy Lifestyle.  Know what a healthy weight is for you (roughly BMI <25) and aim to maintain this   Aim for 7+ servings of fruits and vegetables daily   65-80+ fluid ounces of water or unsweet tea for healthy kidneys   Limit to max 1 drink of alcohol per day; avoid smoking/tobacco   Limit animal fats in diet for cholesterol and heart health - choose grass fed whenever available   Avoid highly processed foods, and foods high in saturated/trans fats   Aim for low stress - take time to unwind and care for your mental health   Aim for 150 min of moderate intensity exercise weekly for heart health, and weights twice weekly for bone health   Aim for 7-9 hours of sleep daily   When it comes to diets, agreement about the perfect plan isnt easy to find, even among the experts. Experts at the [facility] of [employer] developed an idea known as the Healthy Eating Plate. Just imagine a plate divided into logical, healthy portions.   The emphasis is on diet quality:   Load up on vegetables and fruits - one-half of your plate: Aim for color and variety, and remember that potatoes dont count.   Go for whole grains - one-quarter of your plate: Whole wheat, barley, wheat berries, quinoa, oats, brown rice, and foods made with them. If you want pasta, go with whole wheat pasta.   Protein power - one-quarter of your plate: Fish, chicken, beans, and nuts are all healthy, versatile protein sources. Limit red meat.   The diet, however, does go beyond the plate, offering a few other suggestions.   Use healthy plant oils, such as olive, canola, soy,  corn, sunflower and peanut. Check the labels, and avoid partially hydrogenated oil, which have unhealthy trans fats.   If youre  thirsty, drink water. Coffee and tea are good in moderation, but skip sugary drinks and limit milk and dairy products to one or two daily servings.   The type of carbohydrate in the diet is more important than the amount. Some sources of carbohydrates, such as vegetables, fruits, whole grains, and beans-are healthier than others.   Finally, stay active  Signed, Thomasene Ripple, DO  03/04/2022 9:36 AM    [facility]

## 2022-03-05 LAB — COMPREHENSIVE METABOLIC PANEL
ALT: 25 IU/L (ref 0–44)
AST: 25 IU/L (ref 0–40)
Albumin/Globulin Ratio: 2.1 (ref 1.2–2.2)
Albumin: 4.2 g/dL (ref 3.8–4.8)
Alkaline Phosphatase: 70 IU/L (ref 44–121)
BUN/Creatinine Ratio: 17 (ref 10–24)
BUN: 17 mg/dL (ref 8–27)
Bilirubin Total: 0.7 mg/dL (ref 0.0–1.2)
CO2: 26 mmol/L (ref 20–29)
Calcium: 9.2 mg/dL (ref 8.6–10.2)
Chloride: 102 mmol/L (ref 96–106)
Creatinine, Ser: 0.99 mg/dL (ref 0.76–1.27)
Globulin, Total: 2 g/dL (ref 1.5–4.5)
Glucose: 118 mg/dL — ABNORMAL HIGH (ref 70–99)
Potassium: 4.7 mmol/L (ref 3.5–5.2)
Sodium: 139 mmol/L (ref 134–144)
Total Protein: 6.2 g/dL (ref 6.0–8.5)
eGFR: 80 mL/min/{1.73_m2} (ref >59)

## 2022-03-05 LAB — CBC
Hematocrit: 43.7 % (ref 37.5–51.0)
Hemoglobin: 14.5 g/dL (ref 13.0–17.7)
MCH: 30.2 pg (ref 26.6–33.0)
MCHC: 33.2 g/dL (ref 31.5–35.7)
MCV: 91 fL (ref 79–97)
Platelets: 167 10*3/uL (ref 150–450)
RBC: 4.8 x10E6/uL (ref 4.14–5.80)
RDW: 12 % (ref 11.6–15.4)
WBC: 5.3 10*3/uL (ref 3.4–10.8)

## 2022-03-05 LAB — MAGNESIUM: Magnesium: 1.9 mg/dL (ref 1.6–2.3)

## 2022-03-07 DIAGNOSIS — I480 Paroxysmal atrial fibrillation: Secondary | ICD-10-CM

## 2022-03-08 ENCOUNTER — Telehealth: Payer: Self-pay | Admitting: Cardiology

## 2022-03-08 MED ORDER — METOPROLOL SUCCINATE ER 50 MG PO TB24: 50.0000 mg | ORAL_TABLET | Freq: Every day | ORAL | 6 refills | Status: DC

## 2022-03-08 NOTE — Telephone Encounter (Signed)
*  STAT* If patient is at the pharmacy, call can be transferred to refill team.   1. Which medications need to be refilled? (please list name of each medication and dose if known)  metoprolol succinate (TOPROL XL) 50 MG 24 hr tablet (Expired)  2. Which pharmacy/location (including street and city if local pharmacy) is medication to be sent to? [facility], [place] [address]  3. Do they need a 30 day or 90 day supply? 90 day supply   Patient is out of medication.

## 2022-03-26 ENCOUNTER — Telehealth (HOSPITAL_BASED_OUTPATIENT_CLINIC_OR_DEPARTMENT_OTHER): Payer: Self-pay | Admitting: Physician Assistant

## 2022-03-26 NOTE — Telephone Encounter (Signed)
Left message regarding the cancellation of the 04/03/22 Echocardiogram---pt has had a recent Echo per Azalee Course, PA--requested patient call back to confirm

## 2022-03-27 NOTE — Telephone Encounter (Signed)
Left message for patient informing him we have cancelled the Echocardiogram appt for 04/03/22 at 3:00 pm per Azalee Course, PA (testing had bee done earlier)

## 2022-04-03 ENCOUNTER — Other Ambulatory Visit (HOSPITAL_BASED_OUTPATIENT_CLINIC_OR_DEPARTMENT_OTHER): Payer: Medicare Other

## 2022-04-09 ENCOUNTER — Telehealth: Payer: Self-pay

## 2022-04-09 ENCOUNTER — Ambulatory Visit: Payer: Medicare Other | Attending: Cardiology | Admitting: Cardiology

## 2022-04-09 VITALS — BP 105/70 | HR 93 | Wt 193.0 lb

## 2022-04-09 DIAGNOSIS — I480 Paroxysmal atrial fibrillation: Secondary | ICD-10-CM | POA: Diagnosis not present

## 2022-04-09 DIAGNOSIS — Z01818 Encounter for other preprocedural examination: Secondary | ICD-10-CM

## 2022-04-09 NOTE — Telephone Encounter (Signed)
Called patient to discuss AVS instructions gave Dr. Mallory Shirk recommendations. Patient was given cardioversion instructions with date and time of procedure. Patient voiced understanding and all (if any) questions were answered. AVS summary mailed to patient as well as sent to MyChart.

## 2022-04-09 NOTE — Patient Instructions (Addendum)
Medication Instructions:  Your physician recommends that you continue on your current medications as directed. Please refer to the Current Medication list given to you today.  *If you need a refill on your cardiac medications before your next appointment, please call your pharmacy*  Lab Work: Your physician recommends that you return for lab work 1 week prior to Cardioversion:  BMET  If you have labs (blood work) drawn today and your tests are completely normal, you will receive your results only by: MyChart Message (if you have MyChart) OR A paper copy in the mail If you have any lab test that is abnormal or we need to change your treatment, we will call you to review the results.  Testing/Procedures: Your physician has recommended that you have a Cardioversion (DCCV). Electrical Cardioversion uses a jolt of electricity to your heart either through paddles or wired patches attached to your chest. This is a controlled, usually prescheduled, procedure. Defibrillation is done under light anesthesia in the hospital, and you usually go home the day of the procedure. This is done to get your heart back into a normal rhythm. You are not awake for the procedure. Please see the instruction sheet given to you today.  Follow-Up: At [facility], you and your health needs are our priority.  As part of our continuing mission to provide you with exceptional heart care, we have created designated Provider Care Teams.  These Care Teams include your primary Cardiologist (physician) and Advanced Practice Providers (APPs -  Physician Assistants and Nurse Practitioners) who all work together to provide you with the care you need, when you need it.  Your next appointment:   2 week(s) after Cardioversion   The format for your next appointment:   In Person  Provider:   Thomasene Ripple, DO  or  APP         Other Instructions       Dear Herbert Evans,    You are scheduled for a Cardioversion on  Monday, December 11 with Dr. Jacques Navy.  Please arrive at the [facility] (Main Entrance A) at [facility]: [address] [place], [place] [address] at 8:00 AM.   DIET:  Nothing to eat or drink after midnight except a sip of water with medications (see medication instructions below)  MEDICATION INSTRUCTIONS:  Continue taking your anticoagulant (blood thinner): Apixaban (Eliquis).  You will need to continue this after your procedure until you are told by your provider that it is safe to stop.    LABS:   Come to the lab at [facility] at [address] between the hours of 8:00 AM and 4:30 PM. You do NOT have to be fasting.   FYI:  For your safety, and to allow [place] to monitor your vital signs accurately during the surgery/procedure we request: If you have artificial nails, gel coating, SNS etc, please have those removed prior to your surgery/procedure. Not having the nail coverings /polish removed may result in cancellation or delay of your surgery/procedure.  You must have a responsible person to drive you home and stay in the waiting area during your procedure. Failure to do so could result in cancellation.  Bring your insurance cards.  *Special Note: Every effort is made to have your procedure done on time. Occasionally there are emergencies that occur at the hospital that may cause delays. Please be patient if a delay does occur.     Important Information About Sugar

## 2022-04-09 NOTE — Telephone Encounter (Signed)
  Patient Consent for Virtual Visit        Herbert Evans has provided verbal consent on 04/09/2022 for a virtual visit (video or telephone).   CONSENT FOR VIRTUAL VISIT FOR:  Herbert Evans  By participating in this virtual visit I agree to the following:  I hereby voluntarily request, consent and authorize [facility] and its employed or contracted physicians, physician assistants, nurse practitioners or other licensed health care professionals (the Practitioner), to provide me with telemedicine health care services (the "Services") as deemed necessary by the treating Practitioner. I acknowledge and consent to receive the Services by the Practitioner via telemedicine. I understand that the telemedicine visit will involve communicating with the Practitioner through live audiovisual communication technology and the disclosure of certain medical information by electronic transmission. I acknowledge that I have been given the opportunity to request an in-person assessment or other available alternative prior to the telemedicine visit and am voluntarily participating in the telemedicine visit.  I understand that I have the right to withhold or withdraw my consent to the use of telemedicine in the course of my care at any time, without affecting my right to future care or treatment, and that the Practitioner or I may terminate the telemedicine visit at any time. I understand that I have the right to inspect all information obtained and/or recorded in the course of the telemedicine visit and may receive copies of available information for a reasonable fee.  I understand that some of the potential risks of receiving the Services via telemedicine include:  Delay or interruption in medical evaluation due to technological equipment failure or disruption; Information transmitted may not be sufficient (e.g. poor resolution of images) to allow for appropriate medical decision making by the  Practitioner; and/or  In rare instances, security protocols could fail, causing a breach of personal health information.  Furthermore, I acknowledge that it is my responsibility to provide information about my medical history, conditions and care that is complete and accurate to the best of my ability. I acknowledge that Practitioner's advice, recommendations, and/or decision may be based on factors not within their control, such as incomplete or inaccurate data provided by me or distortions of diagnostic images or specimens that may result from electronic transmissions. I understand that the practice of medicine is not an exact science and that Practitioner makes no warranties or guarantees regarding treatment outcomes. I acknowledge that a copy of this consent can be made available to me via my patient portal [facility] MyChart), or I can request a printed copy by calling the office of [facility].    I understand that my insurance will be billed for this visit.   I have read or had this consent read to me. I understand the contents of this consent, which adequately explains the benefits and risks of the Services being provided via telemedicine.  I have been provided ample opportunity to ask questions regarding this consent and the Services and have had my questions answered to my satisfaction. I give my informed consent for the services to be provided through the use of telemedicine in my medical care

## 2022-04-09 NOTE — H&P (View-Only) (Signed)
Virtual Visit via Telephone Note   Because of Herbert Evans's co-morbid illnesses, he is at least at moderate risk for complications without adequate follow up.  This format is felt to be most appropriate for this patient at this time.  The patient did not have access to video technology/had technical difficulties with video requiring transitioning to audio format only (telephone).  All issues noted in this document were discussed and addressed.  No physical exam could be performed with this format.  Please refer to the patient's chart for his consent to telehealth for [facility].   Date:  04/09/2022   ID:  Herbert Evans, DOB Jan 17, 1946, MRN [id number]  Patient Location: Home Provider Location: Office/Clinic  PCP:  Erskine Emery, NP  Cardiologist:  Thomasene Ripple, DO  Electrophysiologist:  None   Evaluation Performed:  Follow-Up Visit  Chief Complaint:  ' I am ok"  History of Present Illness:    Herbert Evans is a 76 y.o. male with hyperlipidemia statin intolerant history of ventricular tachycardia on beta-blocker, Type 2 Diabetes, history of colon cancer, recently diagnosed atrial fibrillation when he was admitted at [facility] for COVID-19 infection, with drop in EF to 40 to 45%.    The patient does not have symptoms concerning for COVID-19 infection (fever, chills, cough, or new shortness of breath).    I saw the patient in April 2021 at that time we discussed medication choices given the fact that he was intolerant to statin but could not afford PCSK9 inhibitors.  I started the patient on Pitvastatin and zetia.   I saw the patient on July 2021 the patient experiences some shortness of breath I ordered an echocardiogram to reassess his LV function as well as review his cardiac catheterization from 2018 which was normal.  We discussed a follow-up for 3 months unfortunately the patient did not follow-up.   I saw the patient on November 15, 2020 at that time he  reported he had been experiencing some palpitation shortness of breath the office to monitor the patient when he did wear in the interim which showed episodes of NSVT, occasional PVCs as well as paroxysmal atrial tachycardia.  First we increase his beta-blocker but the patient called reporting that he has been still experiencing increasing heart rate.  In the interim I started the patient on amiodarone for 400 mg 7 days a day and then to convert to 200 mg daily.  In addition during his last visit we also reviewed his nuclear stress test which was done at [facility] showing normal nuclear stress test-with no evidence of ischemia.   I saw the patient on December 13, 2021 at that time he was doing well he had responded favorably to the amiodarone.    I did see the patient in May 2023, since that time he has also seen one of our APP's for dyspnea on exertion during that visit his EKG was suspicious for atrial tachycardia versus atrial flutter he was started on Toprol XL 12.5.  In the meantime he had been hospitalized and was noted to be in atrial fibrillation.  Work-up was continued, he was started on anticoagulation with Eliquis.      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    DOE (dyspnea on exertion)  History of colon cancer    03-01-2005   s/p  sigmoid colectomy w/ negative nodes  (07-27-2019  per pt last colonoscopy 2019 , no recurrence)   History of Helicobacter pylori infection    09/ 2006   History of kidney stones    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 - severe PNA age 27   Hypertension    Mixed hyperlipidemia    NSVT (nonsustained ventricular tachycardia) (HCC)    Paroxysmal ventricular tachycardia (HCC)    PAT (paroxysmal atrial tachycardia)    PNA (pneumonia) 10/13/2020   Pneumonia    PVC's (premature ventricular contractions)     RBBB (right bundle branch block)    Right ureteral stone    Type 2 diabetes mellitus (HCC)    followed by pcp --- (07-27-2019  checks blood sugar three times per week in am,  fasting sugar-- 125-160)   Wears dentures    upper   Past Surgical History:  Procedure Laterality Date   CARDIAC CATHETERIZATION  02-09-2002   dr Sharyn Lull  @[facility]    ef 50-55% w/ 10-15% LAD   CARDIAC CATHETERIZATION  06-28-2016   dr 06-30-2016 @[facility]    for arrhythmia)--- normal coronaries, mild focal wall motion abnormallity of distal inferapical wall,  severe right innominate / subclavin tortuosity   CATARACT EXTRACTION W/ INTRAOCULAR LENS  IMPLANT, BILATERAL  2018   COLON RESECTION SIGMOID  03-01-2005   @[facility]    CYSTOSCOPY WITH RETROGRADE PYELOGRAM, URETEROSCOPY AND STENT PLACEMENT Right 08/03/2019   Procedure: CYSTOSCOPY WITH RIGHT  RETROGRADE PYELOGRAM, URETEROSCOPY HOLMIUMM LASER AND STENT PLACEMENT;  Surgeon: 03-03-2005, MD;  Location: [facility] [place];  Service: Urology;  Laterality: Right;   CYSTOSCOPY/RETROGRADE/URETEROSCOPY/STONE EXTRACTION WITH BASKET  1970s   LUNG REMOVAL, PARTIAL Left 1967   lower lobe for pneumonia per pt   TONSILLECTOMY AND ADENOIDECTOMY  child     Current Meds  Medication Sig   albuterol (ACCUNEB) 0.63 MG/3ML nebulizer solution Take 1 ampule by nebulization every 6 (six) hours as needed for wheezing.   amiodarone (PACERONE) 200 MG tablet TAKE 1 TABLET BY MOUTH DAILY.   apixaban (ELIQUIS) 5 MG TABS tablet Take 1 tablet (5 mg total) by mouth 2 (two) times daily.   aspirin EC 81 MG tablet Take 81 mg by mouth every morning.   donepezil (ARICEPT) 10 MG tablet Take 10 mg by mouth daily.   ezetimibe (ZETIA) 10 MG tablet Take 1 tablet by mouth once daily   FARXIGA 10 MG TABS tablet Take 10 mg by mouth daily.   glipiZIDE (GLUCOTROL XL) 2.5 MG 24 hr tablet Take 2.5 mg by mouth daily.   memantine (NAMENDA) 5 MG tablet Take 5 mg by mouth 2 (two) times daily.   metFORMIN (GLUCOPHAGE) 1000 MG  tablet Take 1,000 mg by mouth 2 (two) times daily with a meal.    metoprolol succinate (TOPROL XL) 50 MG 24 hr tablet Take 1 tablet (50 mg total) by mouth daily.   montelukast (SINGULAIR) 10 MG tablet Take 10 mg by mouth daily.   NON FORMULARY CPAP at bedtime.   Pitavastatin Calcium (LIVALO) 2 MG TABS Take 1 tablet (2 mg total) by mouth daily.   traZODone (DESYREL) 100 MG tablet Take 150 mg by mouth at bedtime.     Allergies:   Atorvastatin and Rosuvastatin   Social History   Tobacco Use   Smoking status: Former    Types: Cigars   Smokeless tobacco: Current    Types: Snuff,  Chew   Tobacco comments:    01/26/20 dips still last cigar was in 1976  Substance Use Topics   Alcohol use: No   Drug use: Never     Family Hx: The patient's family history includes Asthma in his mother; Heart attack in his mother; Lung cancer in his sister, sister, and sister; Stroke in his father.  ROS:   Please see the history of present illness.     All other systems reviewed and are negative.   Prior CV studies:   The following studies were reviewed today:  Zio monitor 04/01/2022   Patch Wear Time:  14 days and 0 hours (2023-10-26T15:09:35-0400 to 2023-11-09T14:09:39-0500)   Atrial Fibrillation occurred continuously (100% burden), ranging from 61-111 bpm (avg of 93 bpm). Intermittent Bundle Branch Block was present. Isolated VEs were rare (<1.0%), VE Couplets were rare (<1.0%), and no VE Triplets were present.   Symptoms associated with atrial fibrillation.   Conclusion: Study is remarkable for 100% burden atrial fibrillation.    TTE 01/22/2022 FINDINGS   Left Ventricle: Left ventricular ejection fraction, by estimation, is 40 to 45%. The left ventricle has mildly decreased function. The left  ventricle demonstrates global hypokinesis. Definity contrast agent was given IV to delineate the left ventricular   endocardial borders. The left ventricular internal cavity size was normal in size. There  is no left ventricular hypertrophy. Left ventricular  diastolic function could not be evaluated due to atrial fibrillation. Left ventricular diastolic function could  not be evaluated.   Right Ventricle: The right ventricular size is mildly enlarged. No increase in right ventricular wall thickness. Right ventricular systolic  function is normal. There is normal pulmonary artery systolic pressure.  The tricuspid regurgitant velocity is 2.59   m/s, and with an assumed right atrial pressure of 3 mmHg, the estimated right ventricular systolic pressure is 29.8 mmHg.   Left Atrium: Left atrial size was normal in size.   Right Atrium: Right atrial size was normal in size.   Pericardium: There is no evidence of pericardial effusion.   Mitral Valve: The mitral valve is grossly normal. Mild to moderate mitral valve regurgitation. No evidence of mitral valve stenosis.   Tricuspid Valve: The tricuspid valve is grossly normal. Tricuspid valve  regurgitation is trivial. No evidence of tricuspid stenosis.   Aortic Valve: The aortic valve is tricuspid. Aortic valve regurgitation is  not visualized. No aortic stenosis is present.   Pulmonic Valve: The pulmonic valve was grossly normal. Pulmonic valve  regurgitation is mild. No evidence of pulmonic stenosis.   Aorta: The aortic root and ascending aorta are structurally normal, with  no evidence of dilitation.   Venous: The inferior vena cava is normal in size with greater than 50%  respiratory variability, suggesting right atrial pressure of 3 mmHg.   IAS/Shunts: The atrial septum is grossly normal.   Labs/Other Tests and Data Reviewed:    EKG:  No ECG reviewed.  Recent Labs: 01/21/2022: TSH 2.097 03/04/2022: ALT 25; BUN 17; Creatinine, Ser 0.99; Hemoglobin 14.5; Magnesium 1.9; Platelets 167; Potassium 4.7; Sodium 139   Recent Lipid Panel Lab Results  Component Value Date/Time   CHOL 232 (H) 12/11/2020 09:29 AM   TRIG 116 12/11/2020 09:29  AM   HDL 66 12/11/2020 09:29 AM   CHOLHDL 3.5 12/11/2020 09:29 AM   LDLCALC 146 (H) 12/11/2020 09:29 AM    Wt Readings from Last 3 Encounters:  04/09/22 193 lb (87.5 kg)  03/04/22 198 lb (89.8 kg)  01/21/22  194 lb (88 kg)     Objective:    Vital Signs:  BP 105/70 (BP Location: Left Arm, Patient Position: Sitting, Cuff Size: Normal)   Pulse 93   Wt 193 lb (87.5 kg)   BMI 27.69 kg/m    Virtual visit  ASSESSMENT & PLAN:    Paroxysmal atrial fibrillation-his monitor showed continuous A-fib despite the amiodarone.  What I like to do is have the patient get a DC cardioversion.  Hopefully this will help with converting to sinus rhythm.  He is currently on Eliquis for anticoagulation.  Advised the patient to keep this medicine without interruption.  I also advised the patient that if undergo his cardioversion he will have to continue his anticoagulation without interruption for 4 weeks.  COVID-19 Education: The signs and symptoms of COVID-19 were discussed with the patient and how to seek care for testing (follow up with PCP or arrange E-visit).  The importance of social distancing was discussed today.  Time:   Today, I have spent 12 minutes with the patient with telehealth technology discussing the above problems.     Medication Adjustments/Labs and Tests Ordered: Current medicines are reviewed at length with the patient today.  Concerns regarding medicines are outlined above.   Tests Ordered: Orders Placed This Encounter  Procedures   Basic metabolic panel    Medication Changes: No orders of the defined types were placed in this encounter.   Follow Up:  In Person in 2 week(s)  Signed, Thomasene Ripple, DO  04/09/2022 3:26 PM     [facility]

## 2022-04-09 NOTE — Progress Notes (Signed)
Virtual Visit via Telephone Note   Because of Herbert Evans's co-morbid illnesses, he is at least at moderate risk for complications without adequate follow up.  This format is felt to be most appropriate for this patient at this time.  The patient did not have access to video technology/had technical difficulties with video requiring transitioning to audio format only (telephone).  All issues noted in this document were discussed and addressed.  No physical exam could be performed with this format.  Please refer to the patient's chart for his consent to telehealth for [facility].   Date:  04/09/2022   ID:  Herbert Evans, DOB Jan 17, 1946, MRN [id number]  Patient Location: Home Provider Location: Office/Clinic  PCP:  Erskine Emery, NP  Cardiologist:  Thomasene Ripple, DO  Electrophysiologist:  None   Evaluation Performed:  Follow-Up Visit  Chief Complaint:  ' I am ok"  History of Present Illness:    Herbert Evans is a 76 y.o. male with hyperlipidemia statin intolerant history of ventricular tachycardia on beta-blocker, Type 2 Diabetes, history of colon cancer, recently diagnosed atrial fibrillation when he was admitted at [facility] for COVID-19 infection, with drop in EF to 40 to 45%.    The patient does not have symptoms concerning for COVID-19 infection (fever, chills, cough, or new shortness of breath).    I saw the patient in April 2021 at that time we discussed medication choices given the fact that he was intolerant to statin but could not afford PCSK9 inhibitors.  I started the patient on Pitvastatin and zetia.   I saw the patient on July 2021 the patient experiences some shortness of breath I ordered an echocardiogram to reassess his LV function as well as review his cardiac catheterization from 2018 which was normal.  We discussed a follow-up for 3 months unfortunately the patient did not follow-up.   I saw the patient on November 15, 2020 at that time he  reported he had been experiencing some palpitation shortness of breath the office to monitor the patient when he did wear in the interim which showed episodes of NSVT, occasional PVCs as well as paroxysmal atrial tachycardia.  First we increase his beta-blocker but the patient called reporting that he has been still experiencing increasing heart rate.  In the interim I started the patient on amiodarone for 400 mg 7 days a day and then to convert to 200 mg daily.  In addition during his last visit we also reviewed his nuclear stress test which was done at [facility] showing normal nuclear stress test-with no evidence of ischemia.   I saw the patient on December 13, 2021 at that time he was doing well he had responded favorably to the amiodarone.    I did see the patient in May 2023, since that time he has also seen one of our APP's for dyspnea on exertion during that visit his EKG was suspicious for atrial tachycardia versus atrial flutter he was started on Toprol XL 12.5.  In the meantime he had been hospitalized and was noted to be in atrial fibrillation.  Work-up was continued, he was started on anticoagulation with Eliquis.      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    DOE (dyspnea on exertion)  History of colon cancer    03-01-2005   s/p  sigmoid colectomy w/ negative nodes  (07-27-2019  per pt last colonoscopy 2019 , no recurrence)   History of Helicobacter pylori infection    09/ 2006   History of kidney stones    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 - severe PNA age 27   Hypertension    Mixed hyperlipidemia    NSVT (nonsustained ventricular tachycardia) (HCC)    Paroxysmal ventricular tachycardia (HCC)    PAT (paroxysmal atrial tachycardia)    PNA (pneumonia) 10/13/2020   Pneumonia    PVC's (premature ventricular contractions)     RBBB (right bundle branch block)    Right ureteral stone    Type 2 diabetes mellitus (HCC)    followed by pcp --- (07-27-2019  checks blood sugar three times per week in am,  fasting sugar-- 125-160)   Wears dentures    upper   Past Surgical History:  Procedure Laterality Date   CARDIAC CATHETERIZATION  02-09-2002   dr Sharyn Lull  @[facility]    ef 50-55% w/ 10-15% LAD   CARDIAC CATHETERIZATION  06-28-2016   dr 06-30-2016 @[facility]    for arrhythmia)--- normal coronaries, mild focal wall motion abnormallity of distal inferapical wall,  severe right innominate / subclavin tortuosity   CATARACT EXTRACTION W/ INTRAOCULAR LENS  IMPLANT, BILATERAL  2018   COLON RESECTION SIGMOID  03-01-2005   @[facility]    CYSTOSCOPY WITH RETROGRADE PYELOGRAM, URETEROSCOPY AND STENT PLACEMENT Right 08/03/2019   Procedure: CYSTOSCOPY WITH RIGHT  RETROGRADE PYELOGRAM, URETEROSCOPY HOLMIUMM LASER AND STENT PLACEMENT;  Surgeon: 03-03-2005, MD;  Location: [facility] [place];  Service: Urology;  Laterality: Right;   CYSTOSCOPY/RETROGRADE/URETEROSCOPY/STONE EXTRACTION WITH BASKET  1970s   LUNG REMOVAL, PARTIAL Left 1967   lower lobe for pneumonia per pt   TONSILLECTOMY AND ADENOIDECTOMY  child     Current Meds  Medication Sig   albuterol (ACCUNEB) 0.63 MG/3ML nebulizer solution Take 1 ampule by nebulization every 6 (six) hours as needed for wheezing.   amiodarone (PACERONE) 200 MG tablet TAKE 1 TABLET BY MOUTH DAILY.   apixaban (ELIQUIS) 5 MG TABS tablet Take 1 tablet (5 mg total) by mouth 2 (two) times daily.   aspirin EC 81 MG tablet Take 81 mg by mouth every morning.   donepezil (ARICEPT) 10 MG tablet Take 10 mg by mouth daily.   ezetimibe (ZETIA) 10 MG tablet Take 1 tablet by mouth once daily   FARXIGA 10 MG TABS tablet Take 10 mg by mouth daily.   glipiZIDE (GLUCOTROL XL) 2.5 MG 24 hr tablet Take 2.5 mg by mouth daily.   memantine (NAMENDA) 5 MG tablet Take 5 mg by mouth 2 (two) times daily.   metFORMIN (GLUCOPHAGE) 1000 MG  tablet Take 1,000 mg by mouth 2 (two) times daily with a meal.    metoprolol succinate (TOPROL XL) 50 MG 24 hr tablet Take 1 tablet (50 mg total) by mouth daily.   montelukast (SINGULAIR) 10 MG tablet Take 10 mg by mouth daily.   NON FORMULARY CPAP at bedtime.   Pitavastatin Calcium (LIVALO) 2 MG TABS Take 1 tablet (2 mg total) by mouth daily.   traZODone (DESYREL) 100 MG tablet Take 150 mg by mouth at bedtime.     Allergies:   Atorvastatin and Rosuvastatin   Social History   Tobacco Use   Smoking status: Former    Types: Cigars   Smokeless tobacco: Current    Types: Snuff,  Chew   Tobacco comments:    01/26/20 dips still last cigar was in 1976  Substance Use Topics   Alcohol use: No   Drug use: Never     Family Hx: The patient's family history includes Asthma in his mother; Heart attack in his mother; Lung cancer in his sister, sister, and sister; Stroke in his father.  ROS:   Please see the history of present illness.     All other systems reviewed and are negative.   Prior CV studies:   The following studies were reviewed today:  Zio monitor 04/01/2022   Patch Wear Time:  14 days and 0 hours (2023-10-26T15:09:35-0400 to 2023-11-09T14:09:39-0500)   Atrial Fibrillation occurred continuously (100% burden), ranging from 61-111 bpm (avg of 93 bpm). Intermittent Bundle Branch Block was present. Isolated VEs were rare (<1.0%), VE Couplets were rare (<1.0%), and no VE Triplets were present.   Symptoms associated with atrial fibrillation.   Conclusion: Study is remarkable for 100% burden atrial fibrillation.    TTE 01/22/2022 FINDINGS   Left Ventricle: Left ventricular ejection fraction, by estimation, is 40 to 45%. The left ventricle has mildly decreased function. The left  ventricle demonstrates global hypokinesis. Definity contrast agent was given IV to delineate the left ventricular   endocardial borders. The left ventricular internal cavity size was normal in size. There  is no left ventricular hypertrophy. Left ventricular  diastolic function could not be evaluated due to atrial fibrillation. Left ventricular diastolic function could  not be evaluated.   Right Ventricle: The right ventricular size is mildly enlarged. No increase in right ventricular wall thickness. Right ventricular systolic  function is normal. There is normal pulmonary artery systolic pressure.  The tricuspid regurgitant velocity is 2.59   m/s, and with an assumed right atrial pressure of 3 mmHg, the estimated right ventricular systolic pressure is 29.8 mmHg.   Left Atrium: Left atrial size was normal in size.   Right Atrium: Right atrial size was normal in size.   Pericardium: There is no evidence of pericardial effusion.   Mitral Valve: The mitral valve is grossly normal. Mild to moderate mitral valve regurgitation. No evidence of mitral valve stenosis.   Tricuspid Valve: The tricuspid valve is grossly normal. Tricuspid valve  regurgitation is trivial. No evidence of tricuspid stenosis.   Aortic Valve: The aortic valve is tricuspid. Aortic valve regurgitation is  not visualized. No aortic stenosis is present.   Pulmonic Valve: The pulmonic valve was grossly normal. Pulmonic valve  regurgitation is mild. No evidence of pulmonic stenosis.   Aorta: The aortic root and ascending aorta are structurally normal, with  no evidence of dilitation.   Venous: The inferior vena cava is normal in size with greater than 50%  respiratory variability, suggesting right atrial pressure of 3 mmHg.   IAS/Shunts: The atrial septum is grossly normal.   Labs/Other Tests and Data Reviewed:    EKG:  No ECG reviewed.  Recent Labs: 01/21/2022: TSH 2.097 03/04/2022: ALT 25; BUN 17; Creatinine, Ser 0.99; Hemoglobin 14.5; Magnesium 1.9; Platelets 167; Potassium 4.7; Sodium 139   Recent Lipid Panel Lab Results  Component Value Date/Time   CHOL 232 (H) 12/11/2020 09:29 AM   TRIG 116 12/11/2020 09:29  AM   HDL 66 12/11/2020 09:29 AM   CHOLHDL 3.5 12/11/2020 09:29 AM   LDLCALC 146 (H) 12/11/2020 09:29 AM    Wt Readings from Last 3 Encounters:  04/09/22 193 lb (87.5 kg)  03/04/22 198 lb (89.8 kg)  01/21/22   194 lb (88 kg)     Objective:    Vital Signs:  BP 105/70 (BP Location: Left Arm, Patient Position: Sitting, Cuff Size: Normal)   Pulse 93   Wt 193 lb (87.5 kg)   BMI 27.69 kg/m    Virtual visit  ASSESSMENT & PLAN:    Paroxysmal atrial fibrillation-his monitor showed continuous A-fib despite the amiodarone.  What I like to do is have the patient get a DC cardioversion.  Hopefully this will help with converting to sinus rhythm.  He is currently on Eliquis for anticoagulation.  Advised the patient to keep this medicine without interruption.  I also advised the patient that if undergo his cardioversion he will have to continue his anticoagulation without interruption for 4 weeks.  COVID-19 Education: The signs and symptoms of COVID-19 were discussed with the patient and how to seek care for testing (follow up with PCP or arrange E-visit).  The importance of social distancing was discussed today.  Time:   Today, I have spent 12 minutes with the patient with telehealth technology discussing the above problems.     Medication Adjustments/Labs and Tests Ordered: Current medicines are reviewed at length with the patient today.  Concerns regarding medicines are outlined above.   Tests Ordered: Orders Placed This Encounter  Procedures   Basic metabolic panel    Medication Changes: No orders of the defined types were placed in this encounter.   Follow Up:  In Person in 2 week(s)  Signed, Thomasene Ripple, DO  04/09/2022 3:26 PM     [facility]

## 2022-04-19 ENCOUNTER — Telehealth: Payer: Self-pay | Admitting: Cardiology

## 2022-04-19 LAB — BASIC METABOLIC PANEL
BUN/Creatinine Ratio: 17 (ref 10–24)
BUN: 16 mg/dL (ref 8–27)
CO2: 21 mmol/L (ref 20–29)
Calcium: 8.9 mg/dL (ref 8.6–10.2)
Chloride: 98 mmol/L (ref 96–106)
Creatinine, Ser: 0.96 mg/dL (ref 0.76–1.27)
Glucose: 131 mg/dL — ABNORMAL HIGH (ref 70–99)
Potassium: 4.7 mmol/L (ref 3.5–5.2)
Sodium: 136 mmol/L (ref 134–144)
eGFR: 83 mL/min/{1.73_m2} (ref >59)

## 2022-04-19 NOTE — Telephone Encounter (Signed)
Pt states he would like a call back to discuss upcoming procedure. Please advise.

## 2022-04-19 NOTE — Telephone Encounter (Signed)
Returned call to pt he is asking what are the risks? Informed pt that his was went over at his appointment and I do not know. How long? Informed pt that this is on an individualized basis it can take anywhere from a couple hours to overnight. They will go over this when he gets there and make sure to have his questions ready so he can ask all his/their questions. Verbalized understanding

## 2022-04-22 ENCOUNTER — Ambulatory Visit (HOSPITAL_COMMUNITY)
Admission: RE | Admit: 2022-04-22 | Discharge: 2022-04-22 | Disposition: A | Discharge status: Home / Self Care | Payer: Medicare Other | Attending: Internal Medicine | Admitting: Internal Medicine

## 2022-04-22 ENCOUNTER — Encounter (HOSPITAL_COMMUNITY)
Admission: RE | Disposition: A | Discharge status: Home / Self Care | Payer: Self-pay | Source: Home / Self Care | Attending: Internal Medicine

## 2022-04-22 ENCOUNTER — Other Ambulatory Visit: Payer: Self-pay

## 2022-04-22 ENCOUNTER — Ambulatory Visit (HOSPITAL_BASED_OUTPATIENT_CLINIC_OR_DEPARTMENT_OTHER): Payer: Medicare Other | Admitting: Anesthesiology

## 2022-04-22 ENCOUNTER — Encounter (HOSPITAL_COMMUNITY): Payer: Self-pay | Admitting: Internal Medicine

## 2022-04-22 ENCOUNTER — Ambulatory Visit (HOSPITAL_COMMUNITY): Payer: Medicare Other | Admitting: Anesthesiology

## 2022-04-22 DIAGNOSIS — F17290 Nicotine dependence, other tobacco product, uncomplicated: Secondary | ICD-10-CM | POA: Diagnosis not present

## 2022-04-22 DIAGNOSIS — M1990 Unspecified osteoarthritis, unspecified site: Secondary | ICD-10-CM | POA: Diagnosis not present

## 2022-04-22 DIAGNOSIS — Z7901 Long term (current) use of anticoagulants: Secondary | ICD-10-CM | POA: Insufficient documentation

## 2022-04-22 DIAGNOSIS — Z8249 Family history of ischemic heart disease and other diseases of the circulatory system: Secondary | ICD-10-CM | POA: Diagnosis not present

## 2022-04-22 DIAGNOSIS — I4891 Unspecified atrial fibrillation: Secondary | ICD-10-CM | POA: Diagnosis not present

## 2022-04-22 DIAGNOSIS — Z7984 Long term (current) use of oral hypoglycemic drugs: Secondary | ICD-10-CM | POA: Diagnosis not present

## 2022-04-22 DIAGNOSIS — I10 Essential (primary) hypertension: Secondary | ICD-10-CM

## 2022-04-22 DIAGNOSIS — Z87891 Personal history of nicotine dependence: Secondary | ICD-10-CM | POA: Diagnosis not present

## 2022-04-22 DIAGNOSIS — E782 Mixed hyperlipidemia: Secondary | ICD-10-CM | POA: Diagnosis not present

## 2022-04-22 DIAGNOSIS — I480 Paroxysmal atrial fibrillation: Secondary | ICD-10-CM | POA: Insufficient documentation

## 2022-04-22 DIAGNOSIS — Z8616 Personal history of COVID-19: Secondary | ICD-10-CM | POA: Diagnosis not present

## 2022-04-22 DIAGNOSIS — E119 Type 2 diabetes mellitus without complications: Secondary | ICD-10-CM | POA: Diagnosis not present

## 2022-04-22 DIAGNOSIS — Z85038 Personal history of other malignant neoplasm of large intestine: Secondary | ICD-10-CM | POA: Insufficient documentation

## 2022-04-22 HISTORY — PX: CARDIOVERSION: SHX1299

## 2022-04-22 LAB — GLUCOSE, CAPILLARY: Glucose-Capillary: 172 mg/dL — ABNORMAL HIGH (ref 70–99)

## 2022-04-22 SURGERY — CARDIOVERSION
Anesthesia: General

## 2022-04-22 MED ORDER — LIDOCAINE 2% (20 MG/ML) 5 ML SYRINGE
INTRAMUSCULAR | Status: DC | PRN
  Administered 2022-04-22: 60 mg via INTRAVENOUS

## 2022-04-22 MED ORDER — PROPOFOL 10 MG/ML IV BOLUS
INTRAVENOUS | Status: DC | PRN
  Administered 2022-04-22: 50 mg via INTRAVENOUS

## 2022-04-22 MED ORDER — SODIUM CHLORIDE 0.9 % IV SOLN: INTRAVENOUS | Status: DC

## 2022-04-22 NOTE — Interval H&P Note (Signed)
History and Physical Interval Note:  04/22/2022 8:46 AM  Herbert Luster Sr.  has presented today for surgery, with the diagnosis of AFIB.  The various methods of treatment have been discussed with the patient and family. After consideration of risks, benefits and other options for treatment, the patient has consented to  Procedure(s): CARDIOVERSION (N/A) as a surgical intervention.  The patient's history has been reviewed, patient examined, no change in status, stable for surgery.  I have reviewed the patient's chart and labs.  Questions were answered to the patient's satisfaction.     Parke Poisson

## 2022-04-22 NOTE — Discharge Instructions (Signed)

## 2022-04-22 NOTE — Transfer of Care (Signed)
Immediate Anesthesia Transfer of Care Note  Patient: Herbert Evans.  Procedure(s) Performed: CARDIOVERSION  Patient Location: Endoscopy Unit  Anesthesia Type:General  Level of Consciousness: drowsy  Airway & Oxygen Therapy: Patient Spontanous Breathing  Post-op Assessment: Report given to RN and Post -op Vital signs reviewed and stable  Post vital signs: Reviewed and stable  Last Vitals:  Vitals Value Taken Time  BP    Temp    Pulse    Resp    SpO2      Last Pain:  Vitals:   04/22/22 0815  TempSrc: Tympanic  PainSc: 0-No pain         Complications: No notable events documented.

## 2022-04-22 NOTE — Anesthesia Preprocedure Evaluation (Addendum)
Anesthesia Evaluation  Patient identified by MRN, date of birth, ID band Patient awake    Reviewed: Allergy & Precautions, NPO status , Patient's Chart, lab work & pertinent test results, reviewed documented beta blocker date and time   Airway Mallampati: II  TM Distance: >3 FB Neck ROM: Full    Dental  (+) Upper Dentures, Edentulous Upper, Dental Advisory Given   Pulmonary former smoker   Pulmonary exam normal breath sounds clear to auscultation       Cardiovascular hypertension, Pt. on home beta blockers and Pt. on medications + DOE  Normal cardiovascular exam+ dysrhythmias Atrial Fibrillation and Ventricular Tachycardia  Rhythm:Irregular Rate:Normal  TTE 2023 1. Left ventricular ejection fraction, by estimation, is 40 to 45%. The  left ventricle has mildly decreased function. The left ventricle  demonstrates global hypokinesis. Left ventricular diastolic function could  not be evaluated.   2. Right ventricular systolic function is normal. The right ventricular  size is mildly enlarged. There is normal pulmonary artery systolic  pressure. The estimated right ventricular systolic pressure is 29.8 mmHg.   3. The mitral valve is grossly normal. Mild to moderate mitral valve  regurgitation. No evidence of mitral stenosis.   4. The aortic valve is tricuspid. Aortic valve regurgitation is not  visualized. No aortic stenosis is present.   5. The inferior vena cava is normal in size with greater than 50%  respiratory variability, suggesting right atrial pressure of 3 mmHg.     Neuro/Psych negative neurological ROS  negative psych ROS   GI/Hepatic negative GI ROS, Neg liver ROS,,,  Endo/Other  diabetes, Type 2, Oral Hypoglycemic Agents    Renal/GU negative Renal ROS  negative genitourinary   Musculoskeletal  (+) Arthritis ,    Abdominal   Peds  Hematology negative hematology ROS (+)   Anesthesia Other Findings    Reproductive/Obstetrics                             Anesthesia Physical Anesthesia Plan  ASA: 3  Anesthesia Plan: General   Post-op Pain Management:    Induction: Intravenous  PONV Risk Score and Plan: Propofol infusion and Treatment may vary due to age or medical condition  Airway Management Planned: Natural Airway  Additional Equipment:   Intra-op Plan:   Post-operative Plan:   Informed Consent: I have reviewed the patients History and Physical, chart, labs and discussed the procedure including the risks, benefits and alternatives for the proposed anesthesia with the patient or authorized representative who has indicated his/her understanding and acceptance.     Dental advisory given  Plan Discussed with: CRNA  Anesthesia Plan Comments:        Anesthesia Quick Evaluation

## 2022-04-22 NOTE — CV Procedure (Addendum)
Procedure: Electrical Cardioversion Indications:  Atrial Fibrillation  Procedure Details:  Consent: Risks of procedure as well as the alternatives and risks of each were explained to the (patient/caregiver).  Consent for procedure obtained.  Confirmed by phone with patient's granddaughter that he takes Eliquis twice daily and no missed doses in last 4 weeks.  Time Out: Verified patient identification, verified procedure, site/side was marked, verified correct patient position, special equipment/implants available, medications/allergies/relevent history reviewed, required imaging and test results available. PERFORMED.  Patient placed on cardiac monitor, pulse oximetry, supplemental oxygen as necessary.  Sedation given:  propofol per anesthesia Pacer pads placed anterior and posterior chest.  Cardioverted 1 time(s).  Cardioversion with synchronized biphasic 120J shock.  Evaluation: Findings: Post procedure EKG shows:  sinus bradycardia Complications: None Patient did tolerate procedure well.  Time Spent Directly with the Patient:  30 minutes   Parke Poisson 04/22/2022, 9:08 AM

## 2022-04-24 NOTE — Anesthesia Postprocedure Evaluation (Signed)
Anesthesia Post Note  Patient: Herbert NOGUEZ Sr.  Procedure(s) Performed: CARDIOVERSION     Patient location during evaluation: Endoscopy Anesthesia Type: General Level of consciousness: awake and alert Pain management: pain level controlled Vital Signs Assessment: post-procedure vital signs reviewed and stable Respiratory status: spontaneous breathing, nonlabored ventilation, respiratory function stable and patient connected to nasal cannula oxygen Cardiovascular status: blood pressure returned to baseline and stable Postop Assessment: no apparent nausea or vomiting Anesthetic complications: no  No notable events documented.  Last Vitals:  Vitals:   04/22/22 0920 04/22/22 0930  BP: 99/70 103/66  Pulse: (!) 55 (!) 55  Resp: 18 (!) 21  Temp: 36.6 C   SpO2: 96% 96%    Last Pain:  Vitals:   04/23/22 1545  TempSrc:   PainSc: 0-No pain                 Averyanna Sax L Avigdor Dollar

## 2022-04-25 ENCOUNTER — Encounter (HOSPITAL_COMMUNITY): Payer: Self-pay | Admitting: Internal Medicine

## 2022-04-26 ENCOUNTER — Telehealth: Payer: Self-pay | Admitting: Cardiology

## 2022-04-26 NOTE — Telephone Encounter (Signed)
Patient called in to triage because of heart rate 45. Heart rate in high 40s since yesterday. BP while on phone 122/67, P 45, O2 sat 96%. He denies SOB, dizziness, lightheadedness, palpitations. He is not staying hydrated; urine yellow. Recommended he increase fluid intake. He is taking meds as prescribed. Please advise on P.

## 2022-04-26 NOTE — Telephone Encounter (Signed)
STAT if HR is under 50 or over 120 (normal HR is 60-100 beats per minute)  What is your heart rate?  Hr 47  States its been around 44-47   Do you have a log of your heart rate readings (document readings)? No   Do you have any other symptoms? No, pt states since his cardioversion his hr has been very low. Please advise

## 2022-05-01 ENCOUNTER — Other Ambulatory Visit: Payer: Self-pay | Admitting: Cardiology

## 2022-05-02 ENCOUNTER — Other Ambulatory Visit: Payer: Self-pay

## 2022-05-02 ENCOUNTER — Telehealth: Payer: Self-pay

## 2022-05-02 MED ORDER — METOPROLOL SUCCINATE ER 25 MG PO TB24: 25.0000 mg | ORAL_TABLET | Freq: Every day | ORAL | 3 refills | Status: DC

## 2022-05-02 NOTE — Telephone Encounter (Signed)
Changed metoprolol from 50mg  to 25mg  per Dr. wishes. Dr spoke with the patient in regards to the change. Gave a verbal understanding

## 2022-05-02 NOTE — Telephone Encounter (Signed)
Patient seen office today with his wife.  He has been on 50 mg of Toprol XL.  Since the cardioversion his heart rate has dropped.  I would like to cut the patient back to 25 mg of the metoprolol.  Keep him placed on amiodarone 200 mg daily for now.  If we need to take away a rate agent please call back on the beta-blocker before taking off his amiodarone.

## 2022-05-10 ENCOUNTER — Encounter: Payer: Self-pay | Admitting: Physician Assistant

## 2022-05-10 ENCOUNTER — Ambulatory Visit: Payer: Medicare Other | Attending: Physician Assistant | Admitting: Physician Assistant

## 2022-05-10 VITALS — BP 140/62 | HR 51 | Ht 70.0 in | Wt 188.2 lb

## 2022-05-10 DIAGNOSIS — I4892 Unspecified atrial flutter: Secondary | ICD-10-CM | POA: Diagnosis not present

## 2022-05-10 DIAGNOSIS — R918 Other nonspecific abnormal finding of lung field: Secondary | ICD-10-CM | POA: Diagnosis not present

## 2022-05-10 DIAGNOSIS — Z79899 Other long term (current) drug therapy: Secondary | ICD-10-CM

## 2022-05-10 DIAGNOSIS — E785 Hyperlipidemia, unspecified: Secondary | ICD-10-CM

## 2022-05-10 NOTE — Progress Notes (Unsigned)
Cardiology Office Note:    Date:  05/10/2022   ID:  Herbert Luster Sr., DOB 1945-06-04, MRN [id number]  PCP:  Erskine Emery, NP   [facility] Providers Cardiologist:  Thomasene Ripple, DO { Click to update primary MD,subspecialty MD or APP then REFRESH:1}    Referring MD: Erskine Emery, NP   No chief complaint on file. ***  History of Present Illness:    Herbert MOTTON Sr. is a 76 y.o. 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  He is intolerant to statins and unable to afford PCSK9 inhibitors.  He was started on pitavastatin and Zetia.  Card catheterization in 2018 was normal.  Echocardiogram obtained in August 2021 and also June 2022 showed a normal EF.  Echocardiogram obtained on 10/15/2020 showed EF 55 to 60%, grade 1 DD, RVSP 37.3 mmHg, mild to moderate MR, trivial AI.  Previous heart monitor showed episodes of NSVT and occasional PVCs.  Beta-blocker was increased, and he was eventually started on amiodarone therapy.  He had a nuclear stress test obtained at [facility] in 2022 that showed no evidence of ischemia.  He was seen by Dr. Servando Salina on 09/19/2021 at which time he was doing well.  He called later in May complaining of low heart rate.  He had a episode where he was limited laying in bed and began to feel hot and clammy.  Heart rate was 39, blood pressure was 164/99.  His heart rate eventually went back up to the 40s and he felt better.  Beta-blocker was held.  Patient was ultimately supposed to follow-up afterward, however did not do so as symptom went away.   I last saw the patient on 01/18/2022 at which time he was having fatigue, weakness and palpitation.  EKG at the time showed wide-complex tachycardia with heart rate of 116 bpm.  I reviewed the EKG with DOD, however neither 1 of [place] were sure if he was in atrial tachycardia versus 2-1 atrial flutter.  We started the patient on 12.5 mg daily of  metoprolol succinate.  He was admitted to the hospital 2 days later with atrial flutter, he was also tested positive for COVID-19.  Echocardiogram obtained on 01/22/2022 showed EF 40 to 45%, global hypokinesis, RVSP 29.8 mmHg, mild to moderate MR.  He was placed on Eliquis.  It was recommended for the patient to proceed with outpatient cardioversion after he recovers from COVID-19.  CT of the chest in the hospital showed 2 cm focal alveolar density in the lateral aspect of the left upper lobe suggesting pneumonia, possibility of underlying neoplastic process cannot be excluded.  Since discharge, patient was seen by Dr. Servando Salina in October who placed him on the monitor.  He eventually underwent outpatient DCCV on 04/22/2022, post cardioversion he was in sinus bradycardia.  Metoprolol was later reduced to 25 mg daily.  Patient presents today for follow-up.  He continues to have fatigue.  He has been seen by pulmonologist in [place], however did not have the repeat CT of the chest.  I gave him a copy of the CT report from September and asked him to follow-up with his pulmonologist to make sure he get a repeat CT image.  He says he is recovering after the last COVID test, he still have occasional cough.  Lung exam is okay.  He has no lower extremity edema.  EKG today shows he is maintaining sinus  rhythm, however does have T wave inversion in the lateral leads.  He denies any chest pain.  I recommended continue on the current therapy.  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  I did not order chest x-ray since he is due for a CT of the chest anyway.  He is aware to see the eye doctor on a yearly basis.  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    DOE (dyspnea on exertion)    History of colon cancer    03-01-2005   s/p  sigmoid colectomy w/ negative nodes   (07-27-2019  per pt last colonoscopy 2019 , no recurrence)   History of Helicobacter pylori infection    09/ 2006   History of kidney stones    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 - severe PNA age 76   Hypertension    Mixed hyperlipidemia    NSVT (nonsustained ventricular tachycardia) (HCC)    Paroxysmal ventricular tachycardia (HCC)    PAT (paroxysmal atrial tachycardia)    PNA (pneumonia) 10/13/2020   Pneumonia    PVC's (premature ventricular contractions)    RBBB (right bundle branch block)    Right ureteral stone    Type 2 diabetes mellitus (HCC)    followed by pcp --- (07-27-2019  checks blood sugar three times per week in am,  fasting sugar-- 125-160)   Wears dentures    upper    Past Surgical History:  Procedure Laterality Date   CARDIAC CATHETERIZATION  02-09-2002   dr Sharyn Lullharwani  @[facility]    ef 50-55% w/ 10-15% LAD   CARDIAC CATHETERIZATION  06-28-2016   dr Beverely Pacecheek @[facility]    for arrhythmia)--- normal coronaries, mild focal wall motion abnormallity of distal inferapical wall,  severe right innominate / subclavin tortuosity   CARDIOVERSION N/A 04/22/2022   Procedure: CARDIOVERSION;  Surgeon: Parke PoissonAcharya, Gayatri A, MD;  [facility];  Service: Cardiovascular;  Laterality: N/A;   CATARACT EXTRACTION W/ INTRAOCULAR LENS  IMPLANT, BILATERAL  2018   COLON RESECTION SIGMOID  03-01-2005   @[facility]    CYSTOSCOPY WITH RETROGRADE PYELOGRAM, URETEROSCOPY AND STENT PLACEMENT Right 08/03/2019   Procedure: CYSTOSCOPY WITH RIGHT  RETROGRADE PYELOGRAM, URETEROSCOPY HOLMIUMM LASER AND STENT PLACEMENT;  Surgeon: Bjorn PippinWrenn, John, MD;  [facility];  Service: Urology;  Laterality: Right;   CYSTOSCOPY/RETROGRADE/URETEROSCOPY/STONE EXTRACTION WITH BASKET  1970s   LUNG REMOVAL, PARTIAL Left 1967   lower lobe for pneumonia per pt   TONSILLECTOMY AND ADENOIDECTOMY  child    Current Medications: Current Meds  Medication Sig   albuterol (ACCUNEB) 0.63 MG/3ML nebulizer  solution Take 1 ampule by nebulization every 6 (six) hours as needed for wheezing.   amiodarone (PACERONE) 200 MG tablet TAKE 1 TABLET BY MOUTH DAILY.   apixaban (ELIQUIS) 5 MG TABS tablet Take 1 tablet (5 mg total) by mouth 2 (two) times daily.   aspirin EC 81 MG tablet Take 81 mg by mouth every morning.   donepezil (ARICEPT) 10 MG tablet Take 10 mg by mouth daily.   ezetimibe (ZETIA) 10 MG tablet Take 1 tablet by mouth once daily   FARXIGA 10 MG TABS tablet Take 10 mg by mouth daily.   glipiZIDE (GLUCOTROL XL) 2.5 MG 24 hr tablet Take 2.5 mg by mouth daily.  memantine (NAMENDA) 5 MG tablet Take 5 mg by mouth 2 (two) times daily.   metFORMIN (GLUCOPHAGE) 1000 MG tablet Take 1,000 mg by mouth 2 (two) times daily with a meal.    metoprolol succinate (TOPROL XL) 25 MG 24 hr tablet Take 1 tablet (25 mg total) by mouth daily.   montelukast (SINGULAIR) 10 MG tablet Take 10 mg by mouth daily.   NON FORMULARY CPAP at bedtime.   Pitavastatin Calcium (LIVALO) 2 MG TABS Take 1 tablet (2 mg total) by mouth daily.   traZODone (DESYREL) 100 MG tablet Take 150 mg by mouth at bedtime.     Allergies:   Atorvastatin and Rosuvastatin   Social History   Socioeconomic History   Marital status: Married    Spouse name: Not on file   Number of children: Not on file   Years of education: Not on file   Highest education level: Not on file  Occupational History   Not on file  Tobacco Use   Smoking status: Former    Types: Cigars   Smokeless tobacco: Current    Types: Snuff, Chew   Tobacco comments:    01/26/20 dips still last cigar was in 1976  Substance and Sexual Activity   Alcohol use: No   Drug use: Never   Sexual activity: Not on file  Other Topics Concern   Not on file  Social History Narrative   Not on file   Social Determinants of Health   Financial Resource Strain: Not on file  Food Insecurity: Not on file  Transportation Needs: Not on file  Physical Activity: Not on file  Stress:  Not on file  Social Connections: Not on file     Family History: The patient's ***family history includes Asthma in his mother; Heart attack in his mother; Lung cancer in his sister, sister, and sister; Stroke in his father.  ROS:   Please see the history of present illness.    *** All other systems reviewed and are negative.  EKGs/Labs/Other Studies Reviewed:    The following studies were reviewed today: ***  EKG:  EKG is *** ordered today.  The ekg ordered today demonstrates ***  Recent Labs: 01/21/2022: TSH 2.097 03/04/2022: ALT 25; Hemoglobin 14.5; Magnesium 1.9; Platelets 167 04/18/2022: BUN 16; Creatinine, Ser 0.96; Potassium 4.7; Sodium 136  Recent Lipid Panel    Component Value Date/Time   CHOL 232 (H) 12/11/2020 0929   TRIG 116 12/11/2020 0929   HDL 66 12/11/2020 0929   CHOLHDL 3.5 12/11/2020 0929   LDLCALC 146 (H) 12/11/2020 0929     Risk Assessment/Calculations:   {Does this patient have ATRIAL FIBRILLATION?[phone]}       Physical Exam:    VS:  BP (!) 140/62 (BP Location: Left Arm, Patient Position: Sitting, Cuff Size: Large)   Pulse (!) 51   Ht 5\' 10"  (1.778 m)   Wt 188 lb 3.2 oz (85.4 kg)   SpO2 95%   BMI 27.00 kg/m     The patient's 1st BP is elevated (>139/89)*** Repeat BP and {Click to enter a 2nd BP Refresh Note  :1}   Wt Readings from Last 3 Encounters:  05/10/22 188 lb 3.2 oz (85.4 kg)  04/22/22 183 lb (83 kg)  04/09/22 193 lb (87.5 kg)     GEN: *** Well nourished, well developed in no acute distress HEENT: Normal NECK: No JVD; No carotid bruits LYMPHATICS: No lymphadenopathy CARDIAC: ***RRR, no murmurs, rubs, gallops RESPIRATORY:  Clear to auscultation without  rales, wheezing or rhonchi  ABDOMEN: Soft, non-tender, non-distended MUSCULOSKELETAL:  No edema; No deformity  SKIN: Warm and dry NEUROLOGIC:  Alert and oriented x 3 PSYCHIATRIC:  Normal affect   ASSESSMENT:    No diagnosis found. PLAN:    In order of problems  listed above:  ***      {Are you ordering a CV Procedure (e.g. stress test, cath, DCCV, TEE, etc)?   Press F2        :[id number]}    Medication Adjustments/Labs and Tests Ordered: Current medicines are reviewed at length with the patient today.  Concerns regarding medicines are outlined above.  No orders of the defined types were placed in this encounter.  No orders of the defined types were placed in this encounter.   There are no Patient Instructions on file for this visit.   Herbert Evans, [place]  05/10/2022 3:25 PM    [facility]

## 2022-05-10 NOTE — Patient Instructions (Signed)
Medication Instructions:   Your physician recommends that you continue on your current medications as directed. Please refer to the Current Medication list given to you today.  *If you need a refill on your cardiac medications before your next appointment, please call your pharmacy*  Lab Work: Your physician recommends that you return for lab work TODAY:  Hepatic (Liver) Function Test  TSH  If you have labs (blood work) drawn today and your tests are completely normal, you will receive your results only by: MyChart Message (if you have MyChart) OR A paper copy in the mail If you have any lab test that is abnormal or we need to change your treatment, we will call you to review the results.  Testing/Procedures: NONE ordered at this time of appointment   Follow-Up: At [facility], you and your health needs are our priority.  As part of our continuing mission to provide you with exceptional heart care, we have created designated Provider Care Teams.  These Care Teams include your primary Cardiologist (physician) and Advanced Practice Providers (APPs -  Physician Assistants and Nurse Practitioners) who all work together to provide you with the care you need, when you need it.  Your next appointment:   3-4 month(s)  The format for your next appointment:   In Person  Provider:   Thomasene Ripple, DO     Other Instructions  Important Information About Sugar

## 2022-05-11 LAB — HEPATIC FUNCTION PANEL
ALT: 32 IU/L (ref 0–44)
AST: 37 IU/L (ref 0–40)
Albumin: 4.2 g/dL (ref 3.8–4.8)
Alkaline Phosphatase: 76 IU/L (ref 44–121)
Bilirubin Total: 0.5 mg/dL (ref 0.0–1.2)
Bilirubin, Direct: 0.19 mg/dL (ref 0.00–0.40)
Total Protein: 6.8 g/dL (ref 6.0–8.5)

## 2022-05-11 LAB — TSH: TSH: 2.61 u[IU]/mL (ref 0.450–4.500)

## 2022-05-12 ENCOUNTER — Encounter: Payer: Self-pay | Admitting: Physician Assistant

## 2022-07-29 ENCOUNTER — Other Ambulatory Visit: Payer: Self-pay | Admitting: Cardiology

## 2022-07-29 NOTE — Telephone Encounter (Signed)
Rx refill sent to pharmacy. 

## 2022-08-09 ENCOUNTER — Ambulatory Visit: Payer: Medicare Other | Admitting: Cardiology

## 2022-08-10 ENCOUNTER — Other Ambulatory Visit: Payer: Self-pay | Admitting: Cardiology

## 2022-08-12 NOTE — Telephone Encounter (Signed)
Rx refill sent to pharmacy. 

## 2022-08-14 ENCOUNTER — Ambulatory Visit: Payer: Medicare Other | Admitting: Cardiology

## 2022-08-15 ENCOUNTER — Other Ambulatory Visit: Payer: Self-pay | Admitting: *Deleted

## 2022-08-15 MED ORDER — AMIODARONE HCL 200 MG PO TABS: 200.0000 mg | ORAL_TABLET | Freq: Every day | ORAL | 0 refills | Status: DC

## 2022-08-16 ENCOUNTER — Ambulatory Visit: Payer: Medicare Other | Admitting: Cardiology

## 2022-08-21 ENCOUNTER — Other Ambulatory Visit: Payer: Self-pay | Admitting: Cardiology

## 2022-08-21 NOTE — Telephone Encounter (Signed)
Prescription refill request for Eliquis received. Indication: AF Last office visit: 05/10/22  Harrell Lark PA Scr: 0.96 on 04/18/22 Age: 77 Weight: 85.4kg  Based on above findings Eliquis 5mg  twice daily is the appropriate dose.  Refill approved.

## 2022-09-26 ENCOUNTER — Telehealth: Payer: Self-pay | Admitting: Cardiology

## 2022-09-26 NOTE — Telephone Encounter (Signed)
Patient stated someone called him about an appointment but he would rather his wife gets appointment if one is available because she needs one sooner. He did not need help at this time.

## 2022-09-26 NOTE — Telephone Encounter (Signed)
Patient is returning phone call.  °

## 2022-10-29 ENCOUNTER — Other Ambulatory Visit: Payer: Self-pay | Admitting: Cardiology

## 2022-11-28 ENCOUNTER — Ambulatory Visit: Payer: Medicare Other | Admitting: Cardiology

## 2023-01-06 ENCOUNTER — Other Ambulatory Visit: Payer: Self-pay | Admitting: Physician Assistant

## 2023-01-24 ENCOUNTER — Telehealth: Payer: Self-pay | Admitting: Cardiology

## 2023-01-24 ENCOUNTER — Other Ambulatory Visit (HOSPITAL_COMMUNITY): Payer: Self-pay

## 2023-01-24 MED ORDER — WARFARIN SODIUM 5 MG PO TABS: 5.0000 mg | ORAL_TABLET | Freq: Every day | ORAL | 0 refills | Status: DC

## 2023-01-24 NOTE — Telephone Encounter (Signed)
Pharmacy Patient Advocate Encounter  Insurance verification completed.    The patient is insured through  [employer] test claim for pradaxa. Currently not covered/requires prior auth. Eliquis and Xarelto on preferred per rejection   This test claim was processed through [facility]- copay amounts may vary at other pharmacies due to pharmacy/plan contracts, or as the patient moves through the different stages of their insurance plan.

## 2023-01-24 NOTE — Telephone Encounter (Signed)
Patient called into clinic. Patient declined good rx coupon for Pradaxa. Reviewed warfarin and the monitoring required and that there could be a cost for apt. Pt and wife would like to continue with warfarin transition.  He has enough Eliquis through tomorrow. Start warfarin 5mg  daily today. Finish out Eliquis with warfarin overlap INR check at [facility] office 9/18.  Aware he can switch back to Eliquis in Jan. Asked about safety. Advised trials show Eliquis has less bleeding than warfarin. Of note he is on amiodarone. So he may require dose reduction.

## 2023-01-24 NOTE — Telephone Encounter (Signed)
Pt c/o medication issue:  1. Name of Medication: apixaban (ELIQUIS) 5 MG TABS tablet   2. How are you currently taking this medication (dosage and times per day)? Take 1 tablet by mouth twice daily   3. Are you having a reaction (difficulty breathing--STAT)? No   4. What is your medication issue? Patient called and said that it is getting too expensive to get medication. Want to know if he could switch to warfarin via insurance due to it being cheaper

## 2023-01-29 ENCOUNTER — Ambulatory Visit: Payer: Medicare Other | Attending: Cardiovascular Disease

## 2023-01-29 DIAGNOSIS — Z7901 Long term (current) use of anticoagulants: Secondary | ICD-10-CM | POA: Diagnosis not present

## 2023-01-29 DIAGNOSIS — I484 Atypical atrial flutter: Secondary | ICD-10-CM

## 2023-01-29 LAB — POCT INR: INR: 3.4 — AB (ref 2.0–3.0)

## 2023-01-29 NOTE — Patient Instructions (Addendum)
Description   HOLD tomorrow's dose and then START taking continue 1 tablet daily except 1/2 tablet on Tuesdays, Thursdays, and Saturdays.   Stay consistent with greens each week  Amio 200mg  daily  [facility] [phone]   A full discussion of the nature of anticoagulants has been carried out.  A benefit risk analysis has been presented to the patient, so that they understand the justification for choosing anticoagulation at this time. The need for frequent and regular monitoring, precise dosage adjustment and compliance is stressed.  Side effects of potential bleeding are discussed. The patient should avoid any OTC items containing aspirin or ibuprofen, and should avoid great swings in general diet.  Avoid alcohol consumption.  Call if any signs of abnormal bleeding

## 2023-01-30 ENCOUNTER — Other Ambulatory Visit (HOSPITAL_COMMUNITY): Payer: Self-pay

## 2023-01-30 MED ORDER — AMIODARONE HCL 100 MG PO TABS: 100.0000 mg | ORAL_TABLET | Freq: Every day | ORAL | 1 refills | Status: DC

## 2023-01-30 NOTE — Telephone Encounter (Signed)
Called pt to verify his current Amiodarone dose. Pt os currently taking Amiodarone 200 mg once daily. Spoke with Dr. Servando Salina, she wants to decrease his dose to 100 mg once. Relayed message to pt and his wife. Prescription sent to pharmacy.  Pt's wife thanked me for calling them and sending in the medication.

## 2023-01-30 NOTE — Addendum Note (Signed)
Addended by: Reynolds Bowl on: 01/30/2023 03:24 PM   Modules accepted: Orders

## 2023-02-03 ENCOUNTER — Telehealth: Payer: Self-pay | Admitting: Cardiology

## 2023-02-03 ENCOUNTER — Other Ambulatory Visit: Payer: Self-pay | Admitting: Cardiology

## 2023-02-03 MED ORDER — AMIODARONE HCL 200 MG PO TABS: 100.0000 mg | ORAL_TABLET | Freq: Every day | ORAL | 3 refills | Status: DC

## 2023-02-03 NOTE — Telephone Encounter (Signed)
Pt c/o medication issue:  1. Name of Medication: amiodarone (PACERONE) 100 MG tablet   2. How are you currently taking this medication (dosage and times per day)? Take 1 tablet (100 mg total) by mouth daily.   3. Are you having a reaction (difficulty breathing--STAT)? No   4. What is your medication issue? Robin from [name] called to see if Dr. Servando Salina would approve of a medication change from the amiodarone 100 MG 1 tablet to amiodarone 200 MG 1/2 tablet due to cost

## 2023-02-03 NOTE — Telephone Encounter (Signed)
Reviewed and spoke to Dr Servando Salina,  Molli Knock to switch  100 mg Amiodarone  to 1/2 tablet of 200 mg ( equal 100 mg) daily.    Per  Pharmacist $50 days for 30 day supply of 100 mg tablet  200 mg tablet is considerable less.   RN contacted pharmacy , per verbal order from Dr Servando Salina.  Discontinue 100 mg tablet  Change to 1/2 tablet of 200 mg once daily  #45 quantity with  3 refills.

## 2023-02-05 ENCOUNTER — Ambulatory Visit: Payer: Medicare Other | Attending: Cardiology | Admitting: *Deleted

## 2023-02-05 DIAGNOSIS — Z7901 Long term (current) use of anticoagulants: Secondary | ICD-10-CM | POA: Diagnosis not present

## 2023-02-05 DIAGNOSIS — I484 Atypical atrial flutter: Secondary | ICD-10-CM

## 2023-02-05 LAB — POCT INR: INR: 3.6 — AB (ref 2.0–3.0)

## 2023-02-05 NOTE — Patient Instructions (Addendum)
Description   Do not take any warfarin today then START taking 1/2 tablet daily except 1 tablet on Monday, Wednesday, and Friday. Recheck INR in 1 week.   Stay consistent with greens each week. Amio 100mg  daily (using 200mg  tab & taking 1/2 tablet daily).   [facility] [phone]

## 2023-02-10 ENCOUNTER — Ambulatory Visit: Payer: Medicare Other | Attending: Cardiology | Admitting: Cardiology

## 2023-02-10 ENCOUNTER — Encounter: Payer: Self-pay | Admitting: Cardiology

## 2023-02-10 VITALS — BP 132/60 | HR 62 | Ht 70.0 in | Wt 185.4 lb

## 2023-02-10 DIAGNOSIS — I480 Paroxysmal atrial fibrillation: Secondary | ICD-10-CM

## 2023-02-10 DIAGNOSIS — I10 Essential (primary) hypertension: Secondary | ICD-10-CM

## 2023-02-10 DIAGNOSIS — I4729 Other ventricular tachycardia: Secondary | ICD-10-CM

## 2023-02-10 DIAGNOSIS — I484 Atypical atrial flutter: Secondary | ICD-10-CM | POA: Diagnosis not present

## 2023-02-10 NOTE — Progress Notes (Unsigned)
Cardiology Office Note:    Date:  02/10/2023   ID:  Herbert Luster Sr., DOB 10-Jan-1946, MRN [id number]  PCP:  Erskine Emery, NP  Cardiologist:  Thomasene Ripple, DO  Electrophysiologist:  None   Referring MD: Erskine Emery, NP   No chief complaint on file.   History of Present Illness:    Herbert GENTZLER Sr. is a 77 y.o. male with a hx of ***  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    DOE (dyspnea on exertion)    History of colon cancer    03-01-2005   s/p  sigmoid colectomy w/ negative nodes  (07-27-2019  per pt last colonoscopy 2019 , no recurrence)   History of Helicobacter pylori infection    09/ 2006   History of kidney stones    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 - severe PNA age 80   Hypertension    Mixed hyperlipidemia    NSVT (nonsustained ventricular tachycardia) (HCC)    Paroxysmal ventricular tachycardia (HCC)    PAT (paroxysmal atrial tachycardia)    PNA (pneumonia) 10/13/2020   Pneumonia    PVC's (premature ventricular contractions)    RBBB (right bundle branch block)    Right ureteral stone    Type 2 diabetes mellitus (HCC)    followed by pcp --- (07-27-2019  checks blood sugar three times per week in am,  fasting sugar-- 125-160)   Wears dentures    upper    Past Surgical History:  Procedure Laterality Date   CARDIAC CATHETERIZATION  02-09-2002   dr Sharyn Lull  @[facility]    ef 50-55% w/ 10-15% LAD   CARDIAC CATHETERIZATION  06-28-2016   dr Beverely Pace @[facility]    for arrhythmia)--- normal coronaries, mild focal wall motion abnormallity of distal inferapical wall,  severe right innominate / subclavin tortuosity   CARDIOVERSION N/A 04/22/2022   Procedure: CARDIOVERSION;  Surgeon: Parke Poisson, MD;  Location: [facility] ENDOSCOPY;  Service: Cardiovascular;  Laterality: N/A;   CATARACT EXTRACTION W/ INTRAOCULAR LENS  IMPLANT,  BILATERAL  2018   COLON RESECTION SIGMOID  03-01-2005   @[facility]    CYSTOSCOPY WITH RETROGRADE PYELOGRAM, URETEROSCOPY AND STENT PLACEMENT Right 08/03/2019   Procedure: CYSTOSCOPY WITH RIGHT  RETROGRADE PYELOGRAM, URETEROSCOPY HOLMIUMM LASER AND STENT PLACEMENT;  Surgeon: Bjorn Pippin, MD;  Location: [facility];  Service: Urology;  Laterality: Right;   CYSTOSCOPY/RETROGRADE/URETEROSCOPY/STONE EXTRACTION WITH BASKET  1970s   LUNG REMOVAL, PARTIAL Left 1967   lower lobe for pneumonia per pt   TONSILLECTOMY AND ADENOIDECTOMY  child    Current Medications: Current Meds  Medication Sig   albuterol (ACCUNEB) 0.63 MG/3ML nebulizer solution Take 1 ampule by nebulization every 6 (six) hours as needed for wheezing.   amiodarone (PACERONE) 200 MG tablet Take 0.5 tablets (100 mg total) by mouth daily.   aspirin EC 81 MG tablet Take 81 mg by mouth every morning.   Budeson-Glycopyrrol-Formoterol (BREZTRI AEROSPHERE) 160-9-4.8 MCG/ACT AERO Inhale into the lungs. INHALE 1-2 PUFFS AS NEEDED SHORTNESS OF BREATH OR WHEEZING.   donepezil (ARICEPT) 10 MG tablet Take 10 mg by mouth daily.   ezetimibe (ZETIA) 10 MG tablet Take 1 tablet by mouth once daily   FARXIGA 10 MG TABS tablet Take 10 mg by mouth daily.   glipiZIDE (GLUCOTROL XL)  2.5 MG 24 hr tablet Take 2.5 mg by mouth daily.   memantine (NAMENDA) 5 MG tablet Take 5 mg by mouth 2 (two) times daily.   metFORMIN (GLUCOPHAGE) 1000 MG tablet Take 1,000 mg by mouth 2 (two) times daily with a meal.    metoprolol succinate (TOPROL-XL) 25 MG 24 hr tablet Take 1 tablet (25 mg total) by mouth daily.   NON FORMULARY CPAP at bedtime.   traZODone (DESYREL) 100 MG tablet Take 150 mg by mouth at bedtime.   warfarin (COUMADIN) 5 MG tablet Take 1 tablet (5 mg total) by mouth daily.     Allergies:   Atorvastatin and Rosuvastatin   Social History   Socioeconomic History   Marital status: Married    Spouse name: Not on file   Number of children: Not on file    Years of education: Not on file   Highest education level: Not on file  Occupational History   Not on file  Tobacco Use   Smoking status: Former    Types: Cigars   Smokeless tobacco: Current    Types: Snuff, Chew   Tobacco comments:    01/26/20 dips still last cigar was in 1976  Substance and Sexual Activity   Alcohol use: No   Drug use: Never   Sexual activity: Not on file  Other Topics Concern   Not on file  Social History Narrative   Not on file   Social Determinants of Health   Financial Resource Strain: Not on file  Food Insecurity: Not on file  Transportation Needs: Not on file  Physical Activity: Not on file  Stress: Not on file  Social Connections: Not on file     Family History: The patient's family history includes Asthma in his mother; Heart attack in his mother; Lung cancer in his sister, sister, and sister; Stroke in his father.  ROS:   Review of Systems  Constitution: Negative for decreased appetite, fever and weight gain.  HENT: Negative for congestion, ear discharge, hoarse voice and sore throat.   Eyes: Negative for discharge, redness, vision loss in right eye and visual halos.  Cardiovascular: Negative for chest pain, dyspnea on exertion, leg swelling, orthopnea and palpitations.  Respiratory: Negative for cough, hemoptysis, shortness of breath and snoring.   Endocrine: Negative for heat intolerance and polyphagia.  Hematologic/Lymphatic: Negative for bleeding problem. Does not bruise/bleed easily.  Skin: Negative for flushing, nail changes, rash and suspicious lesions.  Musculoskeletal: Negative for arthritis, joint pain, muscle cramps, myalgias, neck pain and stiffness.  Gastrointestinal: Negative for abdominal pain, bowel incontinence, diarrhea and excessive appetite.  Genitourinary: Negative for decreased libido, genital sores and incomplete emptying.  Neurological: Negative for brief paralysis, focal weakness, headaches and loss of balance.   Psychiatric/Behavioral: Negative for altered mental status, depression and suicidal ideas.  Allergic/Immunologic: Negative for HIV exposure and persistent infections.    EKGs/Labs/Other Studies Reviewed:    The following studies were reviewed today:   EKG:  The ekg ordered today demonstrates sinus rhythm HR 62 bpm 1st degress  Recent Labs: 03/04/2022: Hemoglobin 14.5; Magnesium 1.9; Platelets 167 04/18/2022: BUN 16; Creatinine, Ser 0.96; Potassium 4.7; Sodium 136 05/10/2022: ALT 32; TSH 2.610  Recent Lipid Panel    Component Value Date/Time   CHOL 232 (H) 12/11/2020 0929   TRIG 116 12/11/2020 0929   HDL 66 12/11/2020 0929   CHOLHDL 3.5 12/11/2020 0929   LDLCALC 146 (H) 12/11/2020 0929    Physical Exam:    VS:  BP  132/60 (BP Location: Right Arm, Patient Position: Sitting, Cuff Size: Normal)   Pulse 62   Ht 5\' 10"  (1.778 m)   Wt 185 lb 6.4 oz (84.1 kg)   SpO2 96%   BMI 26.60 kg/m     Wt Readings from Last 3 Encounters:  02/10/23 185 lb 6.4 oz (84.1 kg)  05/10/22 188 lb 3.2 oz (85.4 kg)  04/22/22 183 lb (83 kg)     GEN: Well nourished, well developed in no acute distress HEENT: Normal NECK: No JVD; No carotid bruits LYMPHATICS: No lymphadenopathy CARDIAC: S1S2 noted,RRR, no murmurs, rubs, gallops RESPIRATORY:  Clear to auscultation without rales, wheezing or rhonchi  ABDOMEN: Soft, non-tender, non-distended, +bowel sounds, no guarding. EXTREMITIES: No edema, No cyanosis, no clubbing MUSCULOSKELETAL:  No deformity  SKIN: Warm and dry NEUROLOGIC:  Alert and oriented x 3, non-focal PSYCHIATRIC:  Normal affect, good insight  ASSESSMENT:    1. Atypical atrial flutter (HCC)    PLAN:     1.  The patient is in agreement with the above plan. The patient left the office in stable condition.  The patient will follow up in   Medication Adjustments/Labs and Tests Ordered: Current medicines are reviewed at length with the patient today.  Concerns regarding medicines  are outlined above.  Orders Placed This Encounter  Procedures   EKG 12-Lead   No orders of the defined types were placed in this encounter.   There are no Patient Instructions on file for this visit.   Adopting a Healthy Lifestyle.  Know what a healthy weight is for you (roughly BMI <25) and aim to maintain this   Aim for 7+ servings of fruits and vegetables daily   65-80+ fluid ounces of water or unsweet tea for healthy kidneys   Limit to max 1 drink of alcohol per day; avoid smoking/tobacco   Limit animal fats in diet for cholesterol and heart health - choose grass fed whenever available   Avoid highly processed foods, and foods high in saturated/trans fats   Aim for low stress - take time to unwind and care for your mental health   Aim for 150 min of moderate intensity exercise weekly for heart health, and weights twice weekly for bone health   Aim for 7-9 hours of sleep daily   When it comes to diets, agreement about the perfect plan isnt easy to find, even among the experts. Experts at the [facility] of [employer] developed an idea known as the Healthy Eating Plate. Just imagine a plate divided into logical, healthy portions.   The emphasis is on diet quality:   Load up on vegetables and fruits - one-half of your plate: Aim for color and variety, and remember that potatoes dont count.   Go for whole grains - one-quarter of your plate: Whole wheat, barley, wheat berries, quinoa, oats, brown rice, and foods made with them. If you want pasta, go with whole wheat pasta.   Protein power - one-quarter of your plate: Fish, chicken, beans, and nuts are all healthy, versatile protein sources. Limit red meat.   The diet, however, does go beyond the plate, offering a few other suggestions.   Use healthy plant oils, such as olive, canola, soy, corn, sunflower and peanut. Check the labels, and avoid partially hydrogenated oil, which have unhealthy trans fats.   If youre  thirsty, drink water. Coffee and tea are good in moderation, but skip sugary drinks and limit milk and dairy products to one or  two daily servings.   The type of carbohydrate in the diet is more important than the amount. Some sources of carbohydrates, such as vegetables, fruits, whole grains, and beans-are healthier than others.   Finally, stay active  Signed, Thomasene Ripple, DO  02/10/2023 3:44 PM    [facility]

## 2023-02-10 NOTE — Patient Instructions (Signed)
Medication Instructions:  Your physician recommends that you continue on your current medications as directed. Please refer to the Current Medication list given to you today.  *If you need a refill on your cardiac medications before your next appointment, please call your pharmacy*   Lab Work: None  Testing/Procedures: None   Follow-Up: At [facility], you and your health needs are our priority.  As part of our continuing mission to provide you with exceptional heart care, we have created designated Provider Care Teams.  These Care Teams include your primary Cardiologist (physician) and Advanced Practice Providers (APPs -  Physician Assistants and Nurse Practitioners) who all work together to provide you with the care you need, when you need it.   Your next appointment:   6 week(s)  Provider:   Thomasene Ripple, DO     Other Instructions Please transfer Coumadin check ins to [facility] office.   Please make appointment with EP Provider (Dr. Elberta Fortis) in [place] office.

## 2023-02-11 ENCOUNTER — Ambulatory Visit: Payer: Medicare Other | Attending: Cardiology

## 2023-02-11 DIAGNOSIS — I484 Atypical atrial flutter: Secondary | ICD-10-CM

## 2023-02-11 DIAGNOSIS — Z7901 Long term (current) use of anticoagulants: Secondary | ICD-10-CM

## 2023-02-11 LAB — POCT INR: INR: 1.3 — AB (ref 2.0–3.0)

## 2023-02-11 NOTE — Patient Instructions (Signed)
Description   Take 1 tablet today and then START taking 1/2 tablet daily except 1 tablet on Monday, Wednesday, Friday, and Saturday. Recheck INR in 1 week.   Stay consistent with greens each week. Amio 100mg  daily (using 200mg  tab & taking 1/2 tablet daily).   [facility] [phone]

## 2023-02-12 ENCOUNTER — Ambulatory Visit: Payer: Medicare Other

## 2023-02-18 ENCOUNTER — Ambulatory Visit: Payer: Medicare Other | Attending: Cardiology

## 2023-02-18 DIAGNOSIS — Z7901 Long term (current) use of anticoagulants: Secondary | ICD-10-CM | POA: Diagnosis not present

## 2023-02-18 DIAGNOSIS — I484 Atypical atrial flutter: Secondary | ICD-10-CM

## 2023-02-18 LAB — POCT INR: INR: 3.2 — AB (ref 2.0–3.0)

## 2023-02-18 NOTE — Patient Instructions (Signed)
Description   HOLD today's dose and then START taking 1/2 tablet daily except 1 tablet on Monday, Wednesday, Friday (except 1 tablet on this Saturday)  Recheck INR in 1 week.   Amio 100mg  daily (using 200mg  tab & taking 1/2 tablet daily).   C[facility]in Clinic [phone]

## 2023-02-25 ENCOUNTER — Ambulatory Visit: Payer: Medicare Other | Attending: Cardiology

## 2023-02-25 DIAGNOSIS — Z7901 Long term (current) use of anticoagulants: Secondary | ICD-10-CM | POA: Diagnosis not present

## 2023-02-25 DIAGNOSIS — I484 Atypical atrial flutter: Secondary | ICD-10-CM

## 2023-02-25 LAB — POCT INR: INR: 4.7 — AB (ref 2.0–3.0)

## 2023-02-25 NOTE — Patient Instructions (Addendum)
Description   HOLD today's dose and eat greens. Then START taking 1/2 tablet daily except 1 tablet on Mondays and Wednesdays   Stay consistent with greens each week (slaw)  Recheck INR in 1 week.   Amio 100mg  daily (using 200mg  tab & taking 1/2 tablet daily).   [facility] [phone]

## 2023-03-04 ENCOUNTER — Ambulatory Visit: Payer: Medicare Other | Attending: Cardiology

## 2023-03-04 DIAGNOSIS — I484 Atypical atrial flutter: Secondary | ICD-10-CM

## 2023-03-04 DIAGNOSIS — Z7901 Long term (current) use of anticoagulants: Secondary | ICD-10-CM

## 2023-03-04 LAB — POCT INR: INR: 3.4 — AB (ref 2.0–3.0)

## 2023-03-04 NOTE — Patient Instructions (Signed)
Description   HOLD today's dose and then START taking 1/2 tablet daily except 1 tablet on Wednesdays .  Stay consistent with greens each week (slaw)  Recheck INR in 1 week.   Amio 100mg  daily (using 200mg  tab & taking 1/2 tablet daily).   [facility] Clinic [phone]

## 2023-03-11 ENCOUNTER — Ambulatory Visit: Payer: Medicare Other

## 2023-03-11 ENCOUNTER — Ambulatory Visit: Payer: Medicare Other | Attending: Cardiology

## 2023-03-11 DIAGNOSIS — I484 Atypical atrial flutter: Secondary | ICD-10-CM

## 2023-03-11 DIAGNOSIS — Z7901 Long term (current) use of anticoagulants: Secondary | ICD-10-CM | POA: Diagnosis not present

## 2023-03-11 LAB — POCT INR: INR: 1.8 — AB (ref 2.0–3.0)

## 2023-03-11 NOTE — Patient Instructions (Signed)
Description   Take 1 tablet today and then resume taking 1/2 tablet daily except 1 tablet on Wednesdays .  Stay consistent with greens each week (slaw)  Recheck INR in 1 week.   Amio 100mg  daily (using 200mg  tab & taking 1/2 tablet daily).   C[facility]in Clinic [phone]

## 2023-03-15 LAB — LAB REPORT - SCANNED
A1c: 7.9
EGFR: 75.5

## 2023-03-16 ENCOUNTER — Other Ambulatory Visit: Payer: Self-pay | Admitting: Cardiology

## 2023-03-18 ENCOUNTER — Ambulatory Visit: Payer: Medicare Other | Attending: Cardiology

## 2023-03-18 DIAGNOSIS — I484 Atypical atrial flutter: Secondary | ICD-10-CM

## 2023-03-18 DIAGNOSIS — Z7901 Long term (current) use of anticoagulants: Secondary | ICD-10-CM | POA: Diagnosis not present

## 2023-03-18 LAB — POCT INR: INR: 2.5 (ref 2.0–3.0)

## 2023-03-18 NOTE — Patient Instructions (Signed)
Description   Continue taking 1/2 tablet daily except 1 tablet on Wednesdays .  Stay consistent with greens each week (slaw)  Recheck INR in 2 weeks   Amio 100mg  daily (using 200mg  tab & taking 1/2 tablet daily).   Coumadin Clinic [phone]

## 2023-03-24 ENCOUNTER — Ambulatory Visit: Payer: Medicare Other | Attending: Cardiology | Admitting: Cardiology

## 2023-04-01 ENCOUNTER — Ambulatory Visit: Payer: Medicare Other | Attending: Cardiology

## 2023-04-01 DIAGNOSIS — I484 Atypical atrial flutter: Secondary | ICD-10-CM

## 2023-04-01 DIAGNOSIS — Z7901 Long term (current) use of anticoagulants: Secondary | ICD-10-CM | POA: Diagnosis not present

## 2023-04-01 LAB — POCT INR: INR: 2 (ref 2.0–3.0)

## 2023-04-01 NOTE — Patient Instructions (Signed)
Description   Take 1 tablet today and then continue taking 1/2 tablet daily except 1 tablet on Wednesdays .  Stay consistent with greens each week (slaw)  Recheck INR in 2 weeks   Amio 100mg  daily (using 200mg  tab & taking 1/2 tablet daily).   C[facility]in Clinic [phone]

## 2023-04-07 ENCOUNTER — Ambulatory Visit: Payer: Medicare Other | Admitting: Cardiology

## 2023-04-15 ENCOUNTER — Ambulatory Visit: Payer: Medicare Other

## 2023-04-19 ENCOUNTER — Emergency Department (HOSPITAL_COMMUNITY): Payer: Medicare Other

## 2023-04-19 ENCOUNTER — Emergency Department (HOSPITAL_COMMUNITY)
Admission: EM | Admit: 2023-04-19 | Discharge: 2023-04-19 | Disposition: A | Discharge status: Home / Self Care | Payer: Medicare Other | Attending: Emergency Medicine | Admitting: Emergency Medicine

## 2023-04-19 ENCOUNTER — Encounter (HOSPITAL_COMMUNITY): Payer: Self-pay | Admitting: *Deleted

## 2023-04-19 ENCOUNTER — Other Ambulatory Visit: Payer: Self-pay

## 2023-04-19 DIAGNOSIS — Z7901 Long term (current) use of anticoagulants: Secondary | ICD-10-CM | POA: Diagnosis not present

## 2023-04-19 DIAGNOSIS — M25552 Pain in left hip: Secondary | ICD-10-CM | POA: Insufficient documentation

## 2023-04-19 DIAGNOSIS — I10 Essential (primary) hypertension: Secondary | ICD-10-CM | POA: Diagnosis not present

## 2023-04-19 DIAGNOSIS — R0789 Other chest pain: Secondary | ICD-10-CM | POA: Diagnosis not present

## 2023-04-19 DIAGNOSIS — S32040A Wedge compression fracture of fourth lumbar vertebra, initial encounter for closed fracture: Secondary | ICD-10-CM | POA: Insufficient documentation

## 2023-04-19 DIAGNOSIS — Z7984 Long term (current) use of oral hypoglycemic drugs: Secondary | ICD-10-CM | POA: Diagnosis not present

## 2023-04-19 DIAGNOSIS — Z7982 Long term (current) use of aspirin: Secondary | ICD-10-CM | POA: Insufficient documentation

## 2023-04-19 DIAGNOSIS — R519 Headache, unspecified: Secondary | ICD-10-CM | POA: Diagnosis not present

## 2023-04-19 DIAGNOSIS — Z79899 Other long term (current) drug therapy: Secondary | ICD-10-CM | POA: Insufficient documentation

## 2023-04-19 DIAGNOSIS — W19XXXA Unspecified fall, initial encounter: Secondary | ICD-10-CM | POA: Insufficient documentation

## 2023-04-19 DIAGNOSIS — E119 Type 2 diabetes mellitus without complications: Secondary | ICD-10-CM | POA: Insufficient documentation

## 2023-04-19 DIAGNOSIS — M25572 Pain in left ankle and joints of left foot: Secondary | ICD-10-CM | POA: Diagnosis not present

## 2023-04-19 DIAGNOSIS — S3992XA Unspecified injury of lower back, initial encounter: Secondary | ICD-10-CM | POA: Diagnosis present

## 2023-04-19 LAB — COMPREHENSIVE METABOLIC PANEL
ALT: 16 U/L (ref 0–44)
AST: 21 U/L (ref 15–41)
Albumin: 3.5 g/dL (ref 3.5–5.0)
Alkaline Phosphatase: 62 U/L (ref 38–126)
Anion gap: 9 (ref 5–15)
BUN: 15 mg/dL (ref 8–23)
CO2: 23 mmol/L (ref 22–32)
Calcium: 9 mg/dL (ref 8.9–10.3)
Chloride: 104 mmol/L (ref 98–111)
Creatinine, Ser: 1.09 mg/dL (ref 0.61–1.24)
GFR, Estimated: >60 mL/min (ref >60)
Glucose, Bld: 163 mg/dL — ABNORMAL HIGH (ref 70–99)
Potassium: 4 mmol/L (ref 3.5–5.1)
Sodium: 136 mmol/L (ref 135–145)
Total Bilirubin: 0.6 mg/dL (ref <1.2)
Total Protein: 6.5 g/dL (ref 6.5–8.1)

## 2023-04-19 LAB — CBC
HCT: 42.3 % (ref 39.0–52.0)
Hemoglobin: 13.8 g/dL (ref 13.0–17.0)
MCH: 29.2 pg (ref 26.0–34.0)
MCHC: 32.6 g/dL (ref 30.0–36.0)
MCV: 89.4 fL (ref 80.0–100.0)
Platelets: 175 10*3/uL (ref 150–400)
RBC: 4.73 MIL/uL (ref 4.22–5.81)
RDW: 13.4 % (ref 11.5–15.5)
WBC: 6.6 10*3/uL (ref 4.0–10.5)
nRBC: 0 % (ref 0.0–0.2)

## 2023-04-19 LAB — TROPONIN I (HIGH SENSITIVITY)
Troponin I (High Sensitivity): 7 ng/L (ref <18)
Troponin I (High Sensitivity): 9 ng/L (ref <18)

## 2023-04-19 LAB — MAGNESIUM: Magnesium: 1.9 mg/dL (ref 1.7–2.4)

## 2023-04-19 MED ORDER — METHOCARBAMOL 500 MG PO TABS: 500.0000 mg | ORAL_TABLET | Freq: Three times a day (TID) | ORAL | 0 refills | Status: DC | PRN

## 2023-04-19 MED ORDER — OXYCODONE-ACETAMINOPHEN 5-325 MG PO TABS: 1.0000 | ORAL_TABLET | Freq: Three times a day (TID) | ORAL | 0 refills | Status: AC | PRN

## 2023-04-19 MED ORDER — LIDOCAINE 5 % EX PTCH
1.0000 | MEDICATED_PATCH | CUTANEOUS | Status: DC
  Administered 2023-04-19: 1 via TRANSDERMAL
  Filled 2023-04-19: qty 1

## 2023-04-19 MED ORDER — OXYCODONE-ACETAMINOPHEN 5-325 MG PO TABS
1.0000 | ORAL_TABLET | Freq: Once | ORAL | Status: AC
  Administered 2023-04-19: 1 via ORAL
  Filled 2023-04-19: qty 1

## 2023-04-19 MED ORDER — LIDOCAINE 5 % EX PTCH: 1.0000 | MEDICATED_PATCH | CUTANEOUS | 0 refills | Status: AC

## 2023-04-19 NOTE — ED Notes (Signed)
Called ortho for brace and ace wrap

## 2023-04-19 NOTE — ED Triage Notes (Signed)
The pt fell in the shower 2-3 days ago  since then  no loc  he is on coumadin  he called his doctor  but the office was closed   pain lower back pain lt hip pain lt foot and he has  a headache over his rt eye since  he has redness and irritation in his rt eye

## 2023-04-19 NOTE — Discharge Instructions (Addendum)
You have what appears to be a very mild fracture of L4.  Wear brace when up and out of bed to minimize twisting movements and pain.  Prescriptions for medications to minimize pain were sent to your pharmacy.  Call the telephone number below to set up a follow-up appointment with the spine doctor office.  They will want to see you in 4 to 6 weeks.  Return to the emergency department for any new or worsening symptoms of concern.

## 2023-04-19 NOTE — Progress Notes (Signed)
Orthopedic Tech Progress Note Patient Details:  AMR LONGS Sr. 11-29-1945 [id number]  Ortho Devices Type of Ortho Device: Thoracolumbar corset (TLSO), Ace wrap Ortho Device/Splint Location: lle ankle ace wrap Ortho Device/Splint Interventions: Ordered, Application, Adjustment  I fit the patient for the tlso then I taught them how to apply the brace and remove it. Post Interventions Patient Tolerated: Well Instructions Provided: Care of device, Adjustment of device  Trinna Post 04/19/2023, 8:56 PM

## 2023-04-19 NOTE — ED Provider Notes (Signed)
[place] EMERGENCY DEPARTMENT AT [facility] Provider Note   CSN: [id number] Arrival date & time: 04/19/23  1608     History  Chief Complaint  Patient presents with   Herbert Hudson Sr. is a 77 y.o. male.   Fall Associated symptoms include chest pain.  Patient presents for fall.  Medical history includes T2DM, HLD, HTN, atrial fibrillation.  He does take Coumadin.  Although he is unsure of the day he fell, his wife thinks it was 5 days ago.  At the time, patient was taking a shower.  He lifted his leg up to wash and lost his balance.  Since his fall, he has had pain to left ankle, left hip, lower back, and right side of chest.  He is on Coumadin and does believe that he struck his head.  Yesterday, he noticed some redness to his right eye.  He typically ambulates without a cane or walker.  He has been using a cane today due to the pain with ambulation.  He tried a back brace with no relief.  Last pain medication he took with some Tylenol yesterday.     Home Medications Prior to Admission medications   Medication Sig Start Date End Date Taking? Authorizing Provider  lidocaine (LIDODERM) 5 % Place 1 patch onto the skin daily. Remove & Discard patch within 12 hours or as directed by MD 04/19/23  Yes Gloris Manchester, MD  methocarbamol (ROBAXIN) 500 MG tablet Take 1 tablet (500 mg total) by mouth every 8 (eight) hours as needed for muscle spasms. 04/19/23  Yes Gloris Manchester, MD  oxyCODONE-acetaminophen (PERCOCET/ROXICET) 5-325 MG tablet Take 1 tablet by mouth every 8 (eight) hours as needed for up to 4 days for severe pain (pain score 7-10). 04/19/23 04/23/23 Yes Gloris Manchester, MD  albuterol (ACCUNEB) 0.63 MG/3ML nebulizer solution Take 1 ampule by nebulization every 6 (six) hours as needed for wheezing.    [provider]  amiodarone (PACERONE) 200 MG tablet Take 0.5 tablets (100 mg total) by mouth daily. 02/03/23   Tobb, Kardie, DO  aspirin EC 81 MG tablet Take 81 mg by  mouth every morning.    [provider]  Budeson-Glycopyrrol-Formoterol (BREZTRI AEROSPHERE) 160-9-4.8 MCG/ACT AERO Inhale into the lungs. INHALE 1-2 PUFFS AS NEEDED SHORTNESS OF BREATH OR WHEEZING.    [provider]  donepezil (ARICEPT) 10 MG tablet Take 10 mg by mouth daily. 04/08/22   [provider]  ezetimibe (ZETIA) 10 MG tablet Take 1 tablet by mouth once daily 02/04/23   Azalee Course, PA  FARXIGA 10 MG TABS tablet Take 10 mg by mouth daily. 04/02/21   [provider]  glipiZIDE (GLUCOTROL XL) 2.5 MG 24 hr tablet Take 2.5 mg by mouth daily. 01/04/22   [provider]  memantine (NAMENDA) 5 MG tablet Take 5 mg by mouth 2 (two) times daily. 11/02/19   [provider]  metFORMIN (GLUCOPHAGE) 1000 MG tablet Take 1,000 mg by mouth 2 (two) times daily with a meal.     [provider]  metoprolol succinate (TOPROL-XL) 25 MG 24 hr tablet Take 1 tablet (25 mg total) by mouth daily. 01/08/23   Tobb, Kardie, DO  NON FORMULARY CPAP at bedtime.    [provider]  traZODone (DESYREL) 100 MG tablet Take 150 mg by mouth at bedtime. 08/02/19   [provider]  warfarin (COUMADIN) 5 MG tablet Take 1 tablet by mouth once daily 03/17/23  Tobb, Kardie, DO      Allergies    Atorvastatin and Rosuvastatin    Review of Systems   Review of Systems  Eyes:  Positive for redness.  Cardiovascular:  Positive for chest pain.  Musculoskeletal:  Positive for arthralgias and back pain.  All other systems reviewed and are negative.   Physical Exam Updated Vital Signs BP 137/76   Pulse (!) 53   Temp 97.7 F (36.5 C)   Resp 14   Ht 5\' 10"  (1.778 m)   Wt 84.1 kg   SpO2 97%   BMI 26.60 kg/m  Physical Exam Vitals and nursing note reviewed.  Constitutional:      General: He is not in acute distress.    Appearance: Normal appearance. He is well-developed. He is not ill-appearing, toxic-appearing or diaphoretic.  HENT:     Head:  Normocephalic and atraumatic.     Right Ear: External ear normal.     Left Ear: External ear normal.     Nose: Nose normal.     Mouth/Throat:     Mouth: Mucous membranes are moist.  Eyes:     Extraocular Movements: Extraocular movements intact.     Comments: Conjunctival hemorrhage to medial and inferior aspect of right eye  Cardiovascular:     Rate and Rhythm: Normal rate and regular rhythm.     Heart sounds: No murmur heard. Pulmonary:     Effort: Pulmonary effort is normal. No respiratory distress.     Breath sounds: Normal breath sounds.  Chest:     Chest wall: Tenderness present.  Abdominal:     General: There is no distension.     Palpations: Abdomen is soft.     Tenderness: There is no abdominal tenderness.  Musculoskeletal:        General: Tenderness present. No swelling or deformity. Normal range of motion.     Cervical back: Neck supple.  Skin:    General: Skin is warm and dry.     Coloration: Skin is not jaundiced or pale.  Neurological:     General: No focal deficit present.     Mental Status: He is alert and oriented to person, place, and time.  Psychiatric:        Mood and Affect: Mood normal.        Behavior: Behavior normal.     ED Results / Procedures / Treatments   Labs (all labs ordered are listed, but only abnormal results are displayed) Labs Reviewed  COMPREHENSIVE METABOLIC PANEL - Abnormal; Notable for the following components:      Result Value   Glucose, Bld 163 (*)    All other components within normal limits  CBC  MAGNESIUM  TROPONIN I (HIGH SENSITIVITY)  TROPONIN I (HIGH SENSITIVITY)    EKG EKG Interpretation Date/Time:  Saturday April 19 2023 17:37:56 EST Ventricular Rate:  61 PR Interval:  226 QRS Duration:  166 QT Interval:  498 QTC Calculation: 502 R Axis:   143  Text Interpretation: Sinus rhythm Prolonged PR interval RBBB and LPFB Confirmed by Gloris Manchester [phone]) on 04/19/2023 6:24:31 PM  Radiology CT Lumbar Spine Wo  Contrast  Result Date: 04/19/2023 CLINICAL DATA:  Fall, back trauma EXAM: CT LUMBAR SPINE WITHOUT CONTRAST TECHNIQUE: Multidetector CT imaging of the lumbar spine was performed without intravenous contrast administration. Multiplanar CT image reconstructions were also generated. RADIATION DOSE REDUCTION: This exam was performed according to the departmental dose-optimization program which includes automated exposure control, adjustment of the mA and/or  kV according to patient size and/or use of iterative reconstruction technique. COMPARISON:  Lumbar spine radiographs dated 04/19/2023. CT abdomen/pelvis dated 05/12/2019. FINDINGS: Segmentation: 5 lumbar type vertebral bodies. Alignment: Normal lumbar lordosis. Vertebrae: Mild superior endplate changes at L4 which favor an acute mild central compression fracture (sagittal images 47 and 45). Paraspinal and other soft tissues: Vascular calcifications. Disc levels: Mild degenerative changes, most prominent at L5-S1. Spinal canal is patent. Please note that the possible sacrococcygeal abnormality noted on recent radiograph was not imaged. IMPRESSION: Suspected mild superior endplate central compression fracture at L4. Please note that the possible sacrococcygeal abnormality noted on recent radiograph was not imaged. Electronically Signed   By: Charline Bills M.D.   On: 04/19/2023 19:14   CT HEAD WO CONTRAST  Result Date: 04/19/2023 CLINICAL DATA:  Trauma EXAM: CT HEAD WITHOUT CONTRAST CT CERVICAL SPINE WITHOUT CONTRAST TECHNIQUE: Multidetector CT imaging of the head and cervical spine was performed following the standard protocol without intravenous contrast. Multiplanar CT image reconstructions of the cervical spine were also generated. RADIATION DOSE REDUCTION: This exam was performed according to the departmental dose-optimization program which includes automated exposure control, adjustment of the mA and/or kV according to patient size and/or use of iterative  reconstruction technique. COMPARISON:  None Available. FINDINGS: CT HEAD FINDINGS Brain: No evidence of acute infarction, hemorrhage, hydrocephalus, extra-axial collection or mass lesion/mass effect. Mild cortical atrophy. Vascular: Intracranial atherosclerosis. Skull: Normal. Negative for fracture or focal lesion. Sinuses/Orbits: The visualized paranasal sinuses are essentially clear. The right mastoid air cells are unopacified. Old left mastoidectomy. Other: None. CT CERVICAL SPINE FINDINGS Alignment: Normal cervical lordosis. Skull base and vertebrae: No acute fracture. No primary bone lesion or focal pathologic process. Soft tissues and spinal canal: No prevertebral fluid or swelling. No visible canal hematoma. Disc levels: Mild degenerative changes of the mid/lower cervical spine. Spinal canal is patent. Upper chest: Visualized lung apices are notable for left apical pleural-parenchymal scarring. Other: Visualized thyroid is unremarkable IMPRESSION: No acute intracranial abnormality. Mild cortical atrophy. No traumatic injury to the cervical spine. Mild degenerative changes. Electronically Signed   By: Charline Bills M.D.   On: 04/19/2023 18:46   CT CERVICAL SPINE WO CONTRAST  Result Date: 04/19/2023 CLINICAL DATA:  Trauma EXAM: CT HEAD WITHOUT CONTRAST CT CERVICAL SPINE WITHOUT CONTRAST TECHNIQUE: Multidetector CT imaging of the head and cervical spine was performed following the standard protocol without intravenous contrast. Multiplanar CT image reconstructions of the cervical spine were also generated. RADIATION DOSE REDUCTION: This exam was performed according to the departmental dose-optimization program which includes automated exposure control, adjustment of the mA and/or kV according to patient size and/or use of iterative reconstruction technique. COMPARISON:  None Available. FINDINGS: CT HEAD FINDINGS Brain: No evidence of acute infarction, hemorrhage, hydrocephalus, extra-axial collection or  mass lesion/mass effect. Mild cortical atrophy. Vascular: Intracranial atherosclerosis. Skull: Normal. Negative for fracture or focal lesion. Sinuses/Orbits: The visualized paranasal sinuses are essentially clear. The right mastoid air cells are unopacified. Old left mastoidectomy. Other: None. CT CERVICAL SPINE FINDINGS Alignment: Normal cervical lordosis. Skull base and vertebrae: No acute fracture. No primary bone lesion or focal pathologic process. Soft tissues and spinal canal: No prevertebral fluid or swelling. No visible canal hematoma. Disc levels: Mild degenerative changes of the mid/lower cervical spine. Spinal canal is patent. Upper chest: Visualized lung apices are notable for left apical pleural-parenchymal scarring. Other: Visualized thyroid is unremarkable IMPRESSION: No acute intracranial abnormality. Mild cortical atrophy. No traumatic injury to the cervical spine. Mild  degenerative changes. Electronically Signed   By: Charline Bills M.D.   On: 04/19/2023 18:46   DG Hip Unilat W or Wo Pelvis 2-3 Views Left  Result Date: 04/19/2023 CLINICAL DATA:  Larey Seat, left hip pain EXAM: DG HIP (WITH OR WITHOUT PELVIS) 2-3V LEFT COMPARISON:  None Available. FINDINGS: Frontal view of the pelvis as well as frontal and frogleg lateral views of the left hip are obtained. No evidence of acute fracture, subluxation, or dislocation. 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 Sacroiliac joints are normal. Soft tissues are unremarkable. IMPRESSION: 1. Mild bilateral hip osteoarthritis, left greater than right. 2. No acute displaced fracture. Electronically Signed   By: Sharlet Salina M.D.   On: 04/19/2023 18:26   DG Lumbar Spine 2-3Vclearing  Result Date: 04/19/2023 CLINICAL DATA:  Larey Seat in shower 2-3 days ago, lower back pain EXAM: LIMITED LUMBAR SPINE FOR TRAUMA CLEARING - 2-3 VIEW COMPARISON:  None Available. FINDINGS: Frontal and lateral views of the lumbar spine are obtained. There are 5  non-rib-bearing lumbar type vertebral bodies in normal anatomic alignment. There may be a subtle cortical discontinuity at the sacrococcygeal junction compatible with nondisplaced sacral fracture, only seen on the lateral common down view. No other acute fractures. There is diffuse multilevel spondylosis and facet hypertrophy, greatest at the L5-S1 level. Sacroiliac joints appear unremarkable. IMPRESSION: 1. Suspected nondisplaced fracture of the distal sacrum at the sacrococcygeal junction. 2. No other acute lumbar spine fractures. 3. Diffuse lumbar spondylosis and facet hypertrophy, greatest at L5-S1. Electronically Signed   By: Sharlet Salina M.D.   On: 04/19/2023 18:25   DG Ankle 2 Views Left  Result Date: 04/19/2023 CLINICAL DATA:  Larey Seat 2-3 days ago, pain EXAM: LEFT ANKLE - 2 VIEW COMPARISON:  None Available. FINDINGS: Frontal and lateral views of the left ankle are obtained. No acute fracture, subluxation, or dislocation. Prominent superior and inferior calcaneal spurs. Soft tissues are unremarkable. IMPRESSION: 1. No acute displaced fracture. Electronically Signed   By: Sharlet Salina M.D.   On: 04/19/2023 18:22   DG Chest 1 View  Result Date: 04/19/2023 CLINICAL DATA:  Chest pain, fell 2-3 days ago EXAM: CHEST  1 VIEW COMPARISON:  01/21/2022 FINDINGS: Single frontal view of the chest demonstrates a stable cardiac silhouette. Postsurgical changes from prior left lower lobe resection, with volume loss in left hemithorax. No acute airspace disease, effusion, or pneumothorax. Chronic left pleural thickening unchanged. No acute displaced fracture. IMPRESSION: 1. Stable chest, no acute intrathoracic process. Electronically Signed   By: Sharlet Salina M.D.   On: 04/19/2023 18:21    Procedures Procedures    Medications Ordered in ED Medications  lidocaine (LIDODERM) 5 % 1 patch (1 patch Transdermal Patch Applied 04/19/23 1741)  oxyCODONE-acetaminophen (PERCOCET/ROXICET) 5-325 MG per tablet 1 tablet  (1 tablet Oral Given 04/19/23 1741)    ED Course/ Medical Decision Making/ A&P                                 Medical Decision Making Amount and/or Complexity of Data Reviewed Labs: ordered. Radiology: ordered.  Risk Prescription drug management.   This patient presents to the ED for concern of fall, this involves an extensive number of treatment options, and is a complaint that carries with it a high risk of complications and morbidity.  The differential diagnosis includes acute injuries   Co morbidities that complicate the patient evaluation  T2DM, HLD, HTN, atrial fibrillation  Additional history obtained:  Additional history obtained from N/A External records from outside source obtained and reviewed including MR   Lab Tests:  I Ordered, and personally interpreted labs.  The pertinent results include: Normal hemoglobin, no leukocytosis, normal kidney function, normal electrolytes, normal troponin   Imaging Studies ordered:  I ordered imaging studies including x-ray of chest, left ankle, lumbar spine, left hip; CT of head, cervical spine, lumbar spine I independently visualized and interpreted imaging which showed likely acute, mild compression fracture of L4, and possible nondisplaced fracture of distal sacrum I agree with the radiologist interpretation   Cardiac Monitoring: / EKG:  The patient was maintained on a cardiac monitor.  I personally viewed and interpreted the cardiac monitored which showed an underlying rhythm of: Sinus rhythm  Problem List / ED Course / Critical interventions / Medication management  Patient presents for ongoing pain following a fall that occurred 5 days ago.  On arrival in the ED, patient is overall well-appearing.  He endorses pain in multiple areas: Left ankle, left hip, right anterior chest, lower back.  He has been ambulatory since his fall.  There is no appreciable deformities or areas of swelling.  He does have tenderness to  ankle and lumbar spine area of L4.  Chest pain is reproducible.  Percocet and lidocaine patch were ordered for analgesia.  Workup was initiated.  Laboratory workup is reassuring.  Imaging studies were notable for possible nondisplaced distal sacral fracture as well as likely mild L4 compression fracture.  TLSO brace was ordered.  Patient was informed of results.  Multimodal pain control was prescribed.  Patient underwent Ace wrap of ankle and received brace.  He was advised to follow-up with neurosurgery.  He was discharged in stable condition. I ordered medication including Percocet and lidocaine patch for analgesia Reevaluation of the patient after these medicines showed that the patient improved I have reviewed the patients home medicines and have made adjustments as needed   Social Determinants of Health:  Has access to outpatient care        Final Clinical Impression(s) / ED Diagnoses Final diagnoses:  Fall, initial encounter  Closed compression fracture of L4 lumbar vertebra, initial encounter (HCC)    Rx / DC Orders ED Discharge Orders          Ordered    lidocaine (LIDODERM) 5 %  Every 24 hours        04/19/23 1941    methocarbamol (ROBAXIN) 500 MG tablet  Every 8 hours PRN        04/19/23 1941    oxyCODONE-acetaminophen (PERCOCET/ROXICET) 5-325 MG tablet  Every 8 hours PRN        04/19/23 1941              Gloris Manchester, MD 04/19/23 2113

## 2023-04-22 ENCOUNTER — Ambulatory Visit: Payer: Medicare Other | Attending: Cardiology

## 2023-04-22 DIAGNOSIS — Z7901 Long term (current) use of anticoagulants: Secondary | ICD-10-CM

## 2023-04-22 DIAGNOSIS — I484 Atypical atrial flutter: Secondary | ICD-10-CM | POA: Diagnosis not present

## 2023-04-22 LAB — POCT INR: INR: 1.5 — AB (ref 2.0–3.0)

## 2023-04-22 NOTE — Patient Instructions (Signed)
Description   Take 1 tablet today and then continue taking 1/2 tablet daily except 1 tablet on Wednesdays .  Stay consistent with greens each week (slaw)  Recheck INR in 1 week   Amio 100mg  daily (using 200mg  tab & taking 1/2 tablet daily).   Coumadin Clinic [phone]

## 2023-04-28 ENCOUNTER — Other Ambulatory Visit: Payer: Self-pay | Admitting: Physician Assistant

## 2023-04-29 ENCOUNTER — Ambulatory Visit: Payer: Medicare Other | Attending: Cardiology

## 2023-04-29 DIAGNOSIS — I484 Atypical atrial flutter: Secondary | ICD-10-CM | POA: Diagnosis not present

## 2023-04-29 DIAGNOSIS — Z7901 Long term (current) use of anticoagulants: Secondary | ICD-10-CM

## 2023-04-29 LAB — POCT INR: INR: 1.4 — AB (ref 2.0–3.0)

## 2023-04-29 NOTE — Patient Instructions (Signed)
Description   Take 1 tablet today and then START taking 1/2 tablet daily except 1 tablet on Wednesdays and Fridays.  Stay consistent with greens each week (slaw)  Recheck INR in 1 week   Amio 100mg  daily (using 200mg  tab & taking 1/2 tablet daily).   [facility]in Clinic [phone]

## 2023-05-06 ENCOUNTER — Ambulatory Visit: Payer: Medicare Other

## 2023-05-20 ENCOUNTER — Telehealth (HOSPITAL_COMMUNITY): Payer: Self-pay

## 2023-05-20 ENCOUNTER — Other Ambulatory Visit (HOSPITAL_COMMUNITY): Payer: Self-pay | Admitting: Interventional Radiology

## 2023-05-20 DIAGNOSIS — S32040A Wedge compression fracture of fourth lumbar vertebra, initial encounter for closed fracture: Secondary | ICD-10-CM

## 2023-05-20 LAB — LAB REPORT - SCANNED: EGFR: 78.2

## 2023-05-20 NOTE — Telephone Encounter (Signed)
Ok per Dr. Estanislado Pandy for L4 KP. AB

## 2023-05-21 ENCOUNTER — Telehealth: Payer: Self-pay

## 2023-05-21 NOTE — Telephone Encounter (Signed)
 Please advise holding coumadin prior to L4 kyphoplasty scheduled for 1/16.   Thank you!  DW

## 2023-05-21 NOTE — Telephone Encounter (Signed)
   Pre-operative Risk Assessment    Patient Name: Herbert MALDONADO Sr.  DOB: 1945-10-01 MRN: [id number]   Date of last office visit: 02/10/23 Date of next office visit: 06/02/23   Request for Surgical Clearance    Procedure:   L4 Kyphoplasty  Date of Surgery:  Clearance 05/29/23                                Surgeon:  Dr. Thyra Nash Surgeon's Group or Practice Name:  [facility] Phone number:  [phone] Fax number:  [phone]   Type of Clearance Requested:   - Medical  - Pharmacy:  Hold Aspirin  and Warfarin (Coumadin ) (Aspirin  x5 days, Warfarin x4days prior to)   Type of Anesthesia:  Not Indicated   Additional requests/questions:    Bonney Ival LOISE Gerome   05/21/2023, 4:27 PM

## 2023-05-21 NOTE — Telephone Encounter (Signed)
   Name: Herbert MURRILLO Sr.  DOB: [birth year]  MRN: [id number]  Primary Cardiologist: [name], DO   Preoperative team, please contact this patient and set up a phone call appointment for further preoperative risk assessment. Please obtain consent and complete medication review. Thank you for your help.  Patient can be put on Friday's schedule 05/23/2023 - 11 am, 120 pm, or 340 pm.   I confirm that guidance regarding antiplatelet and oral anticoagulation therapy has been completed and, if necessary, noted below.  Pending pharmacy recommendations for holding coumadin .  Ideally aspirin  should be continued without interruption, however if the bleeding risk is too great, aspirin  may be held for 5-7 days prior to surgery. Please resume aspirin  post operatively when it is felt to be safe from a bleeding standpoint.     I also confirmed the patient resides in the state of [place] . As per [facility] Medical Board telemedicine laws, the patient must reside in the state in which the provider is licensed.   Barnie Hila, NP 05/21/2023, 4:52 PM [facility]

## 2023-05-22 ENCOUNTER — Telehealth: Payer: Self-pay

## 2023-05-22 NOTE — Telephone Encounter (Signed)
 Called and spoke with pt. Instructed pt to hold Warfarin 5 day prior to procedure per cardiac clearance and resume post procedure or as directed by surgeon. Rescheduled coumadin clinic appt for 06/03/23. Pt verbalized understanding.

## 2023-05-22 NOTE — Telephone Encounter (Signed)
 Patient with diagnosis of PAF on warfarin for anticoagulation.  Patient has repeat INR on 05/27/23  Procedure:  L4 Kyphoplasty  Date of procedure: 05/29/23   CHA2DS2-VASc Score = 4  This indicates a 4.8% annual risk of stroke. The patient's score is based upon: CHF History: 0 HTN History: 1 Diabetes History: 1 Stroke History: 0 Vascular Disease History: 0 Age Score: 2 Gender Score: 0   CrCl 76 mL/min Platelet count 190K   Per office protocol, patient can hold warfarin for 5 days days prior to procedure.    Patient will not need bridging with Lovenox  (enoxaparin ) around procedure.  **This guidance is not considered finalized until pre-operative APP has relayed final recommendations.**

## 2023-05-22 NOTE — Telephone Encounter (Signed)
  Patient Consent for Virtual Visit         Herbert BELLUCCI Sr. has provided verbal consent on 05/22/2023 for a virtual visit (video or telephone).   CONSENT FOR VIRTUAL VISIT FOR:  Herbert FORBES Frost Sr.  By participating in this virtual visit I agree to the following:  I hereby voluntarily request, consent and authorize [facility] and its employed or contracted physicians, physician assistants, nurse practitioners or other licensed health care professionals (the Practitioner), to provide me with telemedicine health care services (the "Services) as deemed necessary by the treating Practitioner. I acknowledge and consent to receive the Services by the Practitioner via telemedicine. I understand that the telemedicine visit will involve communicating with the Practitioner through live audiovisual communication technology and the disclosure of certain medical information by electronic transmission. I acknowledge that I have been given the opportunity to request an in-person assessment or other available alternative prior to the telemedicine visit and am voluntarily participating in the telemedicine visit.  I understand that I have the right to withhold or withdraw my consent to the use of telemedicine in the course of my care at any time, without affecting my right to future care or treatment, and that the Practitioner or I may terminate the telemedicine visit at any time. I understand that I have the right to inspect all information obtained and/or recorded in the course of the telemedicine visit and may receive copies of available information for a reasonable fee.  I understand that some of the potential risks of receiving the Services via telemedicine include:  Delay or interruption in medical evaluation due to technological equipment failure or disruption; Information transmitted may not be sufficient (e.g. poor resolution of images) to allow for appropriate medical decision making by the  Practitioner; and/or  In rare instances, security protocols could fail, causing a breach of personal health information.  Furthermore, I acknowledge that it is my responsibility to provide information about my medical history, conditions and care that is complete and accurate to the best of my ability. I acknowledge that Practitioner's advice, recommendations, and/or decision may be based on factors not within their control, such as incomplete or inaccurate data provided by me or distortions of diagnostic images or specimens that may result from electronic transmissions. I understand that the practice of medicine is not an exact science and that Practitioner makes no warranties or guarantees regarding treatment outcomes. I acknowledge that a copy of this consent can be made available to me via my patient portal [facility] MyChart), or I can request a printed copy by calling the office of [facility].    I understand that my insurance will be billed for this visit.   I have read or had this consent read to me. I understand the contents of this consent, which adequately explains the benefits and risks of the Services being provided via telemedicine.  I have been provided ample opportunity to ask questions regarding this consent and the Services and have had my questions answered to my satisfaction. I give my informed consent for the services to be provided through the use of telemedicine in my medical care

## 2023-05-22 NOTE — Telephone Encounter (Signed)
 Pt scheduled for tele visit on 05/23/23. Med rec and consent done

## 2023-05-23 ENCOUNTER — Ambulatory Visit: Payer: Medicare Other | Attending: Cardiology | Admitting: Student

## 2023-05-23 DIAGNOSIS — Z01810 Encounter for preprocedural cardiovascular examination: Secondary | ICD-10-CM | POA: Diagnosis not present

## 2023-05-23 NOTE — Progress Notes (Signed)
 Virtual Visit via Telephone Note   Because of Herbert DOWTY Sr.'s co-morbid illnesses, he is at least at moderate risk for complications without adequate follow up.  This format is felt to be most appropriate for this patient at this time.  The patient did not have access to video technology/had technical difficulties with video requiring transitioning to audio format only (telephone).  All issues noted in this document were discussed and addressed.  No physical exam could be performed with this format.  Please refer to the patient's chart for his consent to telehealth for Herbert Evans.  Evaluation Performed:  Preoperative cardiovascular risk assessment _____________   Date:  05/23/2023   Patient ID:  Herbert FORBES Frost Sr., DOB 1945-08-09, MRN [id number] Patient Location:  Home Provider location:   Office  Primary Care Provider:  Silvano Angeline FALCON, NP Primary Cardiologist:  [name], DO  Chief Complaint / Patient Profile   78 y.o. y/o male with a h/o ventricular tachycardia, PAF on anticoagulation, chronic HFmrEF, hypertension, hyperlipidemia, tobacco abuse, colon cancer, T2DM who is pending L4 kyphoplasty by Dr. Dolphus on 05/29/2023 and presents today for telephonic preoperative cardiovascular risk assessment.  History of Present Illness    Herbert SHAMBLIN Sr. is a 78 y.o. male who presents via audio/video conferencing for a telehealth visit today.  Pt was last seen in cardiology clinic on 02/10/2023 by Dr. Sheena.  At that time Herbert POTTINGER Sr. was stable from a cardiac standpoint.  The patient is now pending procedure as outlined above. Since his last visit, he is doing well. Patient denies shortness of breath, dyspnea on exertion, lower extremity edema, orthopnea or PND. No chest pain, pressure, or tightness. No palpitations.  He has not been able to exercise since around Thanksgiving secondary to back pain. He is independent with ADLs and able to perform light to moderate  household activities. The date of his surgery has changed. Discussed holding anticoagulation and antiplatelets. He verbalized understanding.   Past Medical History    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    DOE (dyspnea on exertion)    History of colon cancer    03-01-2005   s/p  sigmoid colectomy w/ negative nodes  (07-27-2019  per pt last colonoscopy 2019 , no recurrence)   History of Helicobacter pylori infection    09/ 2006   History of kidney stones    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 - severe PNA age 67   Hypertension    Mixed hyperlipidemia    NSVT (nonsustained ventricular tachycardia) (HCC)    Paroxysmal ventricular tachycardia (HCC)    PAT (paroxysmal atrial tachycardia) (HCC)    PNA (pneumonia) 10/13/2020   Pneumonia    PVC's (premature ventricular contractions)    RBBB (right bundle branch block)    Right ureteral stone    Type 2 diabetes mellitus (HCC)    followed by pcp --- (07-27-2019  checks blood sugar three times per week in am,  fasting sugar-- 125-160)   Wears dentures    upper   Past Surgical History:  Procedure Laterality Date   CARDIAC CATHETERIZATION  02-09-2002   dr levern  @[facility]    ef 50-55% w/ 10-15% LAD   CARDIAC CATHETERIZATION  06-28-2016   dr launie @[facility]   for arrhythmia)--- normal coronaries, mild focal wall motion abnormallity of distal inferapical wall,  severe right innominate / subclavin tortuosity   CARDIOVERSION N/A 04/22/2022   Procedure: CARDIOVERSION;  Surgeon: Loni Soyla LABOR, MD;  Location: [facility];  Service: Cardiovascular;  Laterality: N/A;   CATARACT EXTRACTION W/ INTRAOCULAR LENS  IMPLANT, BILATERAL  2018   COLON RESECTION SIGMOID  03-01-2005   @[facility]    CYSTOSCOPY WITH RETROGRADE PYELOGRAM, URETEROSCOPY AND STENT PLACEMENT Right 08/03/2019   Procedure: CYSTOSCOPY WITH RIGHT   RETROGRADE PYELOGRAM, URETEROSCOPY HOLMIUMM LASER AND STENT PLACEMENT;  Surgeon: Watt Rush, MD;  Location: [facility];  Service: Urology;  Laterality: Right;   CYSTOSCOPY/RETROGRADE/URETEROSCOPY/STONE EXTRACTION WITH BASKET  1970s   LUNG REMOVAL, PARTIAL Left 1967   lower lobe for pneumonia per pt   TONSILLECTOMY AND ADENOIDECTOMY  child    Allergies  Allergies  Allergen Reactions   Atorvastatin Other (See Comments)    myalgias   Rosuvastatin Other (See Comments)    myalgias    Home Medications    Prior to Admission medications   Medication Sig Start Date End Date Taking? Authorizing Provider  albuterol  (ACCUNEB ) 0.63 MG/3ML nebulizer solution Take 1 ampule by nebulization every 6 (six) hours as needed for wheezing.    [provider]  amiodarone  (PACERONE ) 200 MG tablet Take 0.5 tablets (100 mg total) by mouth daily. 02/03/23   Tobb, Kardie, DO  aspirin  EC 81 MG tablet Take 81 mg by mouth every morning.    [provider]  Budeson-Glycopyrrol-Formoterol  (BREZTRI AEROSPHERE) 160-9-4.8 MCG/ACT AERO Inhale into the lungs. INHALE 1-2 PUFFS AS NEEDED SHORTNESS OF BREATH OR WHEEZING.    [provider]  donepezil (ARICEPT) 10 MG tablet Take 10 mg by mouth daily. 04/08/22   [provider]  ezetimibe  (ZETIA ) 10 MG tablet Take 1 tablet by mouth once daily 04/29/23   Tobb, Kardie, DO  FARXIGA 10 MG TABS tablet Take 10 mg by mouth daily. 04/02/21   [provider]  glipiZIDE (GLUCOTROL XL) 2.5 MG 24 hr tablet Take 2.5 mg by mouth daily. 01/04/22   [provider]  lidocaine  (LIDODERM ) 5 % Place 1 patch onto the skin daily. Remove & Discard patch within 12 hours or as directed by MD 04/19/23   Melvenia Motto, MD  memantine  (NAMENDA ) 5 MG tablet Take 5 mg by mouth 2 (two) times daily. 11/02/19   [provider]  metFORMIN (GLUCOPHAGE) 1000 MG tablet Take 1,000 mg by mouth 2 (two) times daily with a meal.     [provider]  methocarbamol  (ROBAXIN ) 500 MG tablet Take 1 tablet (500 mg total) by mouth every 8 (eight) hours as needed for muscle spasms. 04/19/23   Melvenia Motto, MD  metoprolol  succinate (TOPROL -XL) 25 MG 24 hr tablet Take 1 tablet (25 mg total) by mouth daily. 01/08/23   Tobb, Kardie, DO  NON FORMULARY CPAP at bedtime.    [provider]  traZODone  (DESYREL ) 100 MG tablet Take 150 mg by mouth at bedtime. 08/02/19   [provider]  warfarin (COUMADIN ) 5 MG tablet Take 1 tablet by mouth once daily 03/17/23   Tobb, Kardie, DO    Physical Exam    Vital Signs:  Herbert RANGE Sr. does not have vital signs available for review today.  Given telephonic nature of communication, physical exam is limited. AAOx3. NAD. Normal affect.  Speech and respirations are unlabored.   Assessment & Plan    Primary Cardiologist: [name], DO  Preoperative cardiovascular risk assessment.  L4 kyphoplasty by Dr. Dolphus.  Chart reviewed as part of pre-operative protocol coverage. According to the RCRI, patient has a 0.9% risk of MACE. Patient reports activity equivalent to 5.62 METS (Per [facility]).   Given past medical history and time since last visit, based on ACC/AHA guidelines, Herbert [facility] Sr. would be at acceptable risk for the planned procedure without further cardiovascular testing.   Patient was advised that if he develops new symptoms prior to surgery to contact our office to arrange a follow-up appointment.  he verbalized understanding.  Per Pharm D, patient may hold Coumadin  for 5 days prior to procedure.  Patient will not need bridging with Lovenox  (enoxaparin ) around procedure. Per office protocol, he may hold aspirin  for 5-7 days prior to procedure and should resume as soon as hemodynamically stable postoperatively.   I will route this recommendation to the requesting party via Epic fax function.  Please call with questions.  Time:   Today, I have spent 6 minutes with  the patient with telehealth technology discussing medical history, symptoms, and management plan.     Barnie Hila, NP  05/23/2023, 8:16 AM

## 2023-06-02 ENCOUNTER — Encounter: Payer: Self-pay | Admitting: Cardiology

## 2023-06-02 ENCOUNTER — Ambulatory Visit: Payer: Medicare Other | Attending: Cardiology | Admitting: Cardiology

## 2023-06-02 VITALS — BP 124/78 | HR 63 | Ht 71.0 in | Wt 185.0 lb

## 2023-06-02 DIAGNOSIS — I472 Ventricular tachycardia, unspecified: Secondary | ICD-10-CM | POA: Diagnosis not present

## 2023-06-02 DIAGNOSIS — I10 Essential (primary) hypertension: Secondary | ICD-10-CM | POA: Diagnosis not present

## 2023-06-02 DIAGNOSIS — I4819 Other persistent atrial fibrillation: Secondary | ICD-10-CM

## 2023-06-02 DIAGNOSIS — D6869 Other thrombophilia: Secondary | ICD-10-CM

## 2023-06-02 NOTE — Progress Notes (Signed)
Electrophysiology Office Note:   Date:  06/02/2023  ID:  [name], DOB Jun 04, 1945, MRN [id number]  Primary Cardiologist: Thomasene Ripple, DO Primary Heart Failure: None Electrophysiologist: Marika Mahaffy Herbert Loa, MD      History of Present Illness:   Herbert ARONOW Sr. is a 78 y.o. male with h/o atrial fibrillation, diabetes, colon cancer, VT, hyperlipidemia seen today for  for Electrophysiology evaluation of atrial fibrillation at the request of Herbert Evans.    He presents today to discuss his atrial fibrillation.  He is currently on amiodarone.  He wore a cardiac monitor in 2023 that showed a 100% atrial fibrillation burden.  He is continue to have episodes of atrial fibrillation since being on amiodarone.  His episodes are associated with fatigue and shortness of breath.  He has no chest pain.  When he is in normal rhythm, he is able to do most of his daily activities.  He does have some mild dementia and is on Aricept.  Review of systems complete and found to be negative unless listed in HPI.   EP Information / Studies Reviewed:    EKG is not ordered today. EKG from 04/19/23 reviewed which showed sinus rhythm, RBBB        Risk Assessment/Calculations:    CHA2DS2-VASc Score = 4   This indicates a 4.8% annual risk of stroke. The patient's score is based upon: CHF History: 0 HTN History: 1 Diabetes History: 1 Stroke History: 0 Vascular Disease History: 0 Age Score: 2 Gender Score: 0             Physical Exam:   VS:  BP 124/78   Pulse 63   Ht 5\' 11"  (1.803 m)   Wt 185 lb (83.9 kg)   SpO2 95%   BMI 25.80 kg/m    Wt Readings from Last 3 Encounters:  06/02/23 185 lb (83.9 kg)  04/19/23 185 lb 6.5 oz (84.1 kg)  02/10/23 185 lb 6.4 oz (84.1 kg)     GEN: Well nourished, well developed in no acute distress NECK: No JVD; No carotid bruits CARDIAC: Regular rate and rhythm, no murmurs, rubs, gallops RESPIRATORY:  Clear to auscultation without rales, wheezing or  rhonchi  ABDOMEN: Soft, non-tender, non-distended EXTREMITIES:  No edema; No deformity   ASSESSMENT AND PLAN:    1.  Persistent atrial fibrillation: Currently on amiodarone and warfarin.  He would prefer to avoid amiodarone long-term.  In addition, he has continued to have episodes of atrial fibrillation.  Due to that, we Herbert Evans plan for ablation.  Risk and benefits have been discussed.  He understands the risks and is agreed to the procedure.  In the interim, he would prefer to avoid continued therapy with warfarin.  Herbert Evans switch to Eliquis.  He can start this after he is had his kyphoplasty and is safe per operating physician.  Risk, benefits, and alternatives to EP study and radiofrequency/pulse field ablation for afib were also discussed in detail today. These risks include but are not limited to stroke, bleeding, vascular damage, tamponade, perforation, damage to the esophagus, lungs, and other structures, pulmonary vein stenosis, worsening renal function, and death. The patient understands these risk and wishes to proceed.  We [name] therefore proceed with catheter ablation at the next available time.  Carto, ICE, anesthesia are requested for the procedure.  Herbert Evans also obtain CT PV protocol prior to the procedure to exclude LAA thrombus and further evaluate atrial anatomy.  2.  Hypertension:well controlled  3.  Nonsustained  VT: Currently on amiodarone.  Has previously been controlled with beta-blockers.  4.  Secondary hypercoagulable state: Switching from warfarin to Eliquis for atrial fibrillation  Follow up with Dr. Elberta Fortis as usual post procedure  Signed, Herbert Evans Herbert Loa, MD

## 2023-06-02 NOTE — Patient Instructions (Signed)
Medication Instructions:  Your physician has recommended you make the following change in your medication:  STOP Coumadin & START Eliquis.  The Coumadin clinic will be in touch to make this change for you.   *If you need a refill on your cardiac medications before your next appointment, please call your pharmacy*   Lab Work: Pre procedure labs -- we will call you to schedule:  BMP & CBC  If you have a lab test that is abnormal and we need to change your treatment, we will call you to review the results -- otherwise no news is good news.    Testing/Procedures: Your physician has requested that you have cardiac CT 1 month PRIOR to your ablation. Cardiac computed tomography (CT) is a painless test that uses an x-ray machine to take clear, detailed pictures of your heart.  We will call you to schedule.  Your physician has recommended that you have an ablation. Catheter ablation is a medical procedure used to treat some cardiac arrhythmias (irregular heartbeats). During catheter ablation, a long, thin, flexible tube is put into a blood vessel in your groin (upper thigh), or neck. This tube is called an ablation catheter. It is then guided to your heart through the blood vessel. Radio frequency waves destroy small areas of heart tissue where abnormal heartbeats may cause an arrhythmia to start.   Your ablation is scheduled for 09/11/2023. Please arrive at [facility] at 10:30 am.  We will call you for further instructions.   Follow-Up: At [facility], you and your health needs are our priority.  As part of our continuing mission to provide you with exceptional heart care, we have created designated Provider Care Teams.  These Care Teams include your primary Cardiologist (physician) and Advanced Practice Providers (APPs -  Physician Assistants and Nurse Practitioners) who all work together to provide you with the care you need, when you need it.  Your next appointment:   1 month(s) after  your ablation  The format for your next appointment:   In Person  Provider:   AFib clinic   Thank you for choosing [facility]!!   Dory Horn, RN [phone]    Other Instructions   Cardiac Ablation Cardiac ablation is a procedure to destroy (ablate) some heart tissue that is sending bad signals. These bad signals cause problems in heart rhythm. The heart has many areas that make these signals. If there are problems in these areas, they can make the heart beat in a way that is not normal. Destroying some tissues can help make the heart rhythm normal. Tell your doctor about: Any allergies you have. All medicines you are taking. These include vitamins, herbs, eye drops, creams, and over-the-counter medicines. Any problems you or family members have had with medicines that make you fall asleep (anesthetics). Any blood disorders you have. Any surgeries you have had. Any medical conditions you have, such as kidney failure. Whether you are pregnant or may be pregnant. What are the risks? This is a safe procedure. But problems may occur, including: Infection. Bruising and bleeding. Bleeding into the chest. Stroke or blood clots. Damage to nearby areas of your body. Allergies to medicines or dyes. The need for a pacemaker if the normal system is damaged. Failure of the procedure to treat the problem. What happens before the procedure? Medicines Ask your doctor about: Changing or stopping your normal medicines. This is important. Taking aspirin and ibuprofen. Do not take these medicines unless your doctor tells  you to take them. Taking other medicines, vitamins, herbs, and supplements. General instructions Follow instructions from your doctor about what you cannot eat or drink. Plan to have someone take you home from the hospital or clinic. If you will be going home right after the procedure, plan to have someone with you for 24 hours. Ask your doctor what steps will  be taken to prevent infection. What happens during the procedure?  An IV tube will be put into one of your veins. You will be given a medicine to help you relax. The skin on your neck or groin will be numbed. A cut (incision) will be made in your neck or groin. A needle will be put through your cut and into a large vein. A tube (catheter) will be put into the needle. The tube will be moved to your heart. Dye may be put through the tube. This helps your doctor see your heart. Small devices (electrodes) on the tube will send out signals. A type of energy will be used to destroy some heart tissue. The tube will be taken out. Pressure will be held on your cut. This helps stop bleeding. A bandage will be put over your cut. The exact procedure may vary among doctors and hospitals. What happens after the procedure? You will be watched until you leave the hospital or clinic. This includes checking your heart rate, breathing rate, oxygen, and blood pressure. Your cut will be watched for bleeding. You will need to lie still for a few hours. Do not drive for 24 hours or as long as your doctor tells you. Summary Cardiac ablation is a procedure to destroy some heart tissue. This is done to treat heart rhythm problems. Tell your doctor about any medical conditions you may have. Tell him or her about all medicines you are taking to treat them. This is a safe procedure. But problems may occur. These include infection, bruising, bleeding, and damage to nearby areas of your body. Follow what your doctor tells you about food and drink. You may also be told to change or stop some of your medicines. After the procedure, do not drive for 24 hours or as long as your doctor tells you. This information is not intended to replace advice given to you by your health care provider. Make sure you discuss any questions you have with your health care provider. Document Revised: 07/20/2021 Document Reviewed:  04/01/2019 Elsevier Patient Education  2023 Elsevier Inc.   Cardiac Ablation, Care After  This sheet gives you information about how to care for yourself after your procedure. Your health care provider may also give you more specific instructions. If you have problems or questions, contact your health care provider. What can I expect after the procedure? After the procedure, it is common to have: Bruising around your puncture site. Tenderness around your puncture site. Skipped heartbeats. If you had an atrial fibrillation ablation, you may have atrial fibrillation during the first several months after your procedure.  Tiredness (fatigue).  Follow these instructions at home: Puncture site care  Follow instructions from your health care provider about how to take care of your puncture site. Make sure you: If present, leave stitches (sutures), skin glue, or adhesive strips in place. These skin closures may need to stay in place for up to 2 weeks. If adhesive strip edges start to loosen and curl up, you may trim the loose edges. Do not remove adhesive strips completely unless your health care provider tells you to  do that. If a large square bandage is present, this may be removed 24 hours after surgery.  Check your puncture site every day for signs of infection. Check for: Redness, swelling, or pain. Fluid or blood. If your puncture site starts to bleed, lie down on your back, apply firm pressure to the area, and contact your health care provider. Warmth. Pus or a bad smell. A pea or small marble sized lump at the site is normal and can take up to three months to resolve.  Driving Do not drive for at least 4 days after your procedure or however long your health care provider recommends. (Do not resume driving if you have previously been instructed not to drive for other health reasons.) Do not drive or use heavy machinery while taking prescription pain medicine. Activity Avoid activities  that take a lot of effort for at least 7 days after your procedure. Do not lift anything that is heavier than 5 lb (4.5 kg) for one week.  No sexual activity for 1 week.  Return to your normal activities as told by your health care provider. Ask your health care provider what activities are safe for you. General instructions Take over-the-counter and prescription medicines only as told by your health care provider. Do not use any products that contain nicotine or tobacco, such as cigarettes and e-cigarettes. If you need help quitting, ask your health care provider. You may shower after 24 hours, but Do not take baths, swim, or use a hot tub for 1 week.  Do not drink alcohol for 24 hours after your procedure. Keep all follow-up visits as told by your health care provider. This is important. Contact a health care provider if: You have redness, mild swelling, or pain around your puncture site. You have fluid or blood coming from your puncture site that stops after applying firm pressure to the area. Your puncture site feels warm to the touch. You have pus or a bad smell coming from your puncture site. You have a fever. You have chest pain or discomfort that spreads to your neck, jaw, or arm. You have chest pain that is worse with lying on your back or taking a deep breath. You are sweating a lot. You feel nauseous. You have a fast or irregular heartbeat. You have shortness of breath. You are dizzy or light-headed and feel the need to lie down. You have pain or numbness in the arm or leg closest to your puncture site. Get help right away if: Your puncture site suddenly swells. Your puncture site is bleeding and the bleeding does not stop after applying firm pressure to the area. These symptoms may represent a serious problem that is an emergency. Do not wait to see if the symptoms will go away. Get medical help right away. Call your local emergency services (911 in the [place] Do not drive  yourself to the hospital. Summary After the procedure, it is normal to have bruising and tenderness at the puncture site in your groin, neck, or forearm. Check your puncture site every day for signs of infection. Get help right away if your puncture site is bleeding and the bleeding does not stop after applying firm pressure to the area. This is a medical emergency. This information is not intended to replace advice given to you by your health care provider. Make sure you discuss any questions you have with your health care provider.

## 2023-06-03 ENCOUNTER — Telehealth: Payer: Self-pay

## 2023-06-03 NOTE — Telephone Encounter (Signed)
Per office visit with Dr Elberta Fortis on 06/02/23: "Will switch to Eliquis. He can start this after he is had his kyphoplasty and is safe per operating physician."   Pt has kyphoplasty on Thursday, 06/05/23. Called and spoke with pt. Scheduled coumadin clinic appt for post procedure on 06/10/23 and will transition pt to Eliquis at this time. Pt verbalized understanding.

## 2023-06-03 NOTE — Telephone Encounter (Signed)
-----   Message from Nurse Sherri P sent at 06/02/2023  5:47 PM EST ----- Regarding: change to Eliquis Please switch pt over to Eliquis.   (Ablation scheduled for 5/1)  He is holding for now for upcoming kyphoplasty Thursday 1/23, not sure when he restarts it.

## 2023-06-04 ENCOUNTER — Other Ambulatory Visit: Payer: Self-pay | Admitting: Radiology

## 2023-06-04 DIAGNOSIS — S32040A Wedge compression fracture of fourth lumbar vertebra, initial encounter for closed fracture: Secondary | ICD-10-CM

## 2023-06-05 ENCOUNTER — Telehealth: Payer: Self-pay | Admitting: Student

## 2023-06-05 ENCOUNTER — Ambulatory Visit (HOSPITAL_COMMUNITY)
Admission: RE | Admit: 2023-06-05 | Discharge: 2023-06-05 | Disposition: A | Discharge status: Home / Self Care | Payer: Medicare Other | Source: Ambulatory Visit | Attending: Interventional Radiology | Admitting: Interventional Radiology

## 2023-06-05 ENCOUNTER — Other Ambulatory Visit: Payer: Self-pay

## 2023-06-05 DIAGNOSIS — M545 Low back pain, unspecified: Secondary | ICD-10-CM | POA: Insufficient documentation

## 2023-06-05 DIAGNOSIS — W19XXXA Unspecified fall, initial encounter: Secondary | ICD-10-CM | POA: Diagnosis not present

## 2023-06-05 DIAGNOSIS — S32048A Other fracture of fourth lumbar vertebra, initial encounter for closed fracture: Secondary | ICD-10-CM | POA: Diagnosis present

## 2023-06-05 DIAGNOSIS — S32040A Wedge compression fracture of fourth lumbar vertebra, initial encounter for closed fracture: Secondary | ICD-10-CM | POA: Insufficient documentation

## 2023-06-05 HISTORY — PX: IR KYPHO LUMBAR INC FX REDUCE BONE BX UNI/BIL CANNULATION INC/IMAGING: IMG5519

## 2023-06-05 LAB — CBC
HCT: 42.8 % (ref 39.0–52.0)
Hemoglobin: 14.1 g/dL (ref 13.0–17.0)
MCH: 29.7 pg (ref 26.0–34.0)
MCHC: 32.9 g/dL (ref 30.0–36.0)
MCV: 90.1 fL (ref 80.0–100.0)
Platelets: 162 10*3/uL (ref 150–400)
RBC: 4.75 MIL/uL (ref 4.22–5.81)
RDW: 13.6 % (ref 11.5–15.5)
WBC: 4.6 10*3/uL (ref 4.0–10.5)
nRBC: 0 % (ref 0.0–0.2)

## 2023-06-05 LAB — GLUCOSE, CAPILLARY: Glucose-Capillary: 84 mg/dL (ref 70–99)

## 2023-06-05 LAB — BASIC METABOLIC PANEL
Anion gap: 9 (ref 5–15)
BUN: 16 mg/dL (ref 8–23)
CO2: 24 mmol/L (ref 22–32)
Calcium: 8.9 mg/dL (ref 8.9–10.3)
Chloride: 102 mmol/L (ref 98–111)
Creatinine, Ser: 1.04 mg/dL (ref 0.61–1.24)
GFR, Estimated: >60 mL/min (ref >60)
Glucose, Bld: 120 mg/dL — ABNORMAL HIGH (ref 70–99)
Potassium: 4 mmol/L (ref 3.5–5.1)
Sodium: 135 mmol/L (ref 135–145)

## 2023-06-05 LAB — PROTIME-INR
INR: 1 (ref 0.8–1.2)
Prothrombin Time: 13.7 s (ref 11.4–15.2)

## 2023-06-05 MED ORDER — SODIUM CHLORIDE 0.9 % IV SOLN: INTRAVENOUS | Status: AC

## 2023-06-05 MED ORDER — IOHEXOL 300 MG/ML  SOLN
50.0000 mL | Freq: Once | INTRAMUSCULAR | Status: AC | PRN
  Administered 2023-06-05: 20 mL

## 2023-06-05 MED ORDER — TOBRAMYCIN SULFATE 1.2 G IJ SOLR
INTRAMUSCULAR | Status: AC
  Filled 2023-06-05: qty 1.2

## 2023-06-05 MED ORDER — BUPIVACAINE HCL (PF) 0.25 % IJ SOLN
30.0000 mL | Freq: Once | INTRAMUSCULAR | Status: AC
  Administered 2023-06-05: 20 mL

## 2023-06-05 MED ORDER — CEFAZOLIN SODIUM-DEXTROSE 2-4 GM/100ML-% IV SOLN
INTRAVENOUS | Status: AC
  Filled 2023-06-05: qty 100

## 2023-06-05 MED ORDER — BUPIVACAINE HCL (PF) 0.5 % IJ SOLN
INTRAMUSCULAR | Status: AC
  Filled 2023-06-05: qty 30

## 2023-06-05 MED ORDER — CEFAZOLIN SODIUM-DEXTROSE 2-4 GM/100ML-% IV SOLN: 2.0000 g | INTRAVENOUS | Status: DC

## 2023-06-05 MED ORDER — TOBRAMYCIN SULFATE 1.2 G IJ SOLR
1.2000 g | INTRAMUSCULAR | Status: AC
  Administered 2023-06-05: 1.2 g via TOPICAL

## 2023-06-05 MED ORDER — LIDOCAINE HCL (PF) 1 % IJ SOLN
INTRAMUSCULAR | Status: AC
  Filled 2023-06-05: qty 30

## 2023-06-05 MED ORDER — BUPIVACAINE HCL (PF) 0.25 % IJ SOLN
INTRAMUSCULAR | Status: AC
  Filled 2023-06-05: qty 30

## 2023-06-05 MED ORDER — CEFAZOLIN SODIUM-DEXTROSE 2-4 GM/100ML-% IV SOLN
INTRAVENOUS | Status: AC | PRN
  Administered 2023-06-05: 2 g via INTRAVENOUS

## 2023-06-05 MED ORDER — FENTANYL CITRATE (PF) 100 MCG/2ML IJ SOLN
INTRAMUSCULAR | Status: AC
  Filled 2023-06-05: qty 2

## 2023-06-05 MED ORDER — FENTANYL CITRATE (PF) 100 MCG/2ML IJ SOLN
INTRAMUSCULAR | Status: AC | PRN
  Administered 2023-06-05 (×3): 25 ug via INTRAVENOUS

## 2023-06-05 MED ORDER — MIDAZOLAM HCL 2 MG/2ML IJ SOLN
INTRAMUSCULAR | Status: AC | PRN
  Administered 2023-06-05: 1 mg via INTRAVENOUS
  Administered 2023-06-05: .5 mg via INTRAVENOUS

## 2023-06-05 MED ORDER — MIDAZOLAM HCL 2 MG/2ML IJ SOLN
INTRAMUSCULAR | Status: AC
  Filled 2023-06-05: qty 2

## 2023-06-05 MED ORDER — SODIUM CHLORIDE 0.9 % IV SOLN: INTRAVENOUS | Status: DC

## 2023-06-05 NOTE — Telephone Encounter (Signed)
New Message:     She wants to know if patient can have his Coumadiin did while he is still in the hospital?

## 2023-06-05 NOTE — H&P (Signed)
Chief Complaint: Patient was seen in consultation today for back pain from L4 vertebral fracture   Referring Physician(s): Deveshwar,Sanjeev  Supervising Physician: Julieanne Cotton  Patient Status: [facility] - Out-pt  History of Present Illness: Herbert AGUAYO Sr. is a 78 y.o. male with acute low back pain after a fall. Was found to have L4 vertebral compression fracture. Has tried conservative management, wearing TLSO brace for several weeks and use of pain medications. He has not gotten any significant relief. He has been referred for consideration of kyphoplasty.  PMHx, meds, labs, imaging, allergies reviewed. Coumadin has been held appropriately. Feels well otherwise, no recent fevers, chills, illness. Has been NPO today as directed.    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    DOE (dyspnea on exertion)    History of colon cancer    03-01-2005   s/p  sigmoid colectomy w/ negative nodes  (07-27-2019  per pt last colonoscopy 2019 , no recurrence)   History of Helicobacter pylori infection    09/ 2006   History of kidney stones    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 - severe PNA age 12   Hypertension    Mixed hyperlipidemia    NSVT (nonsustained ventricular tachycardia) (HCC)    Paroxysmal ventricular tachycardia (HCC)    PAT (paroxysmal atrial tachycardia) (HCC)    PNA (pneumonia) 10/13/2020   Pneumonia    PVC's (premature ventricular contractions)    RBBB (right bundle branch block)    Right ureteral stone    Type 2 diabetes mellitus (HCC)    followed by pcp --- (07-27-2019  checks blood sugar three times per week in am,  fasting sugar-- 125-160)   Wears dentures    upper    Past Surgical History:  Procedure Laterality Date   CARDIAC CATHETERIZATION  02-09-2002   dr Sharyn Lull  @[facility]    ef 50-55% w/ 10-15% LAD    CARDIAC CATHETERIZATION  06-28-2016   dr Beverely Pace @[facility]    for arrhythmia)--- normal coronaries, mild focal wall motion abnormallity of distal inferapical wall,  severe right innominate / subclavin tortuosity   CARDIOVERSION N/A 04/22/2022   Procedure: CARDIOVERSION;  Surgeon: Parke Poisson, MD;  Location: [facility] ENDOSCOPY;  Service: Cardiovascular;  Laterality: N/A;   CATARACT EXTRACTION W/ INTRAOCULAR LENS  IMPLANT, BILATERAL  2018   COLON RESECTION SIGMOID  03-01-2005   @[facility]    CYSTOSCOPY WITH RETROGRADE PYELOGRAM, URETEROSCOPY AND STENT PLACEMENT Right 08/03/2019   Procedure: CYSTOSCOPY WITH RIGHT  RETROGRADE PYELOGRAM, URETEROSCOPY HOLMIUMM LASER AND STENT PLACEMENT;  Surgeon: Bjorn Pippin, MD;  Location: [facility];  Service: Urology;  Laterality: Right;   CYSTOSCOPY/RETROGRADE/URETEROSCOPY/STONE EXTRACTION WITH BASKET  1970s   LUNG REMOVAL, PARTIAL Left 1967   lower lobe for pneumonia per pt   TONSILLECTOMY AND ADENOIDECTOMY  child    Allergies: Atorvastatin and Rosuvastatin  Medications: Prior to Admission medications   Medication Sig Start Date End Date Taking? Authorizing Provider  albuterol (ACCUNEB) 0.63 MG/3ML nebulizer solution Take 1 ampule by nebulization every 6 (six) hours as needed for wheezing.   Yes [provider]  amiodarone (PACERONE) 200 MG tablet Take 0.5 tablets (100 mg total) by mouth daily. 02/03/23  Yes Tobb, Kardie, DO  aspirin EC 81 MG tablet Take 81 mg by mouth every  morning.    [provider]  Budeson-Glycopyrrol-Formoterol (BREZTRI AEROSPHERE) 160-9-4.8 MCG/ACT AERO Inhale into the lungs. INHALE 1-2 PUFFS AS NEEDED SHORTNESS OF BREATH OR WHEEZING.   Yes [provider]  donepezil (ARICEPT) 10 MG tablet Take 10 mg by mouth daily. 04/08/22  Yes [provider]  ezetimibe (ZETIA) 10 MG tablet Take 1 tablet by mouth once daily 04/29/23  Yes Tobb, Kardie, DO  FARXIGA 10 MG TABS tablet Take 10 mg by mouth daily.  04/02/21  Yes [provider]  gabapentin (NEURONTIN) 300 MG capsule Take 300 mg by mouth at bedtime. 05/09/23   [provider]  glipiZIDE (GLUCOTROL XL) 2.5 MG 24 hr tablet Take 2.5 mg by mouth daily. 01/04/22  Yes [provider]  lidocaine (LIDODERM) 5 % Place 1 patch onto the skin daily. Remove & Discard patch within 12 hours or as directed by MD 04/19/23   Gloris Manchester, MD  memantine (NAMENDA) 5 MG tablet Take 5 mg by mouth 2 (two) times daily. 11/02/19  Yes [provider]  metFORMIN (GLUCOPHAGE) 1000 MG tablet Take 1,000 mg by mouth 2 (two) times daily with a meal.     [provider]  methocarbamol (ROBAXIN) 500 MG tablet Take 1 tablet (500 mg total) by mouth every 8 (eight) hours as needed for muscle spasms. 04/19/23  Yes Gloris Manchester, MD  metoprolol succinate (TOPROL-XL) 25 MG 24 hr tablet Take 1 tablet (25 mg total) by mouth daily. 01/08/23   Tobb, Kardie, DO  NON FORMULARY CPAP at bedtime.   Yes [provider]  traZODone (DESYREL) 100 MG tablet Take 150 mg by mouth at bedtime. 08/02/19  Yes [provider]  TRESIBA FLEXTOUCH 200 UNIT/ML FlexTouch Pen TAKE 10 UNITS SUBCUTANEOUSLY ONCE AT BEDTIME FOR 90 DAYS 05/09/23  Yes [provider]  warfarin (COUMADIN) 5 MG tablet Take 1 tablet by mouth once daily 03/17/23   Tobb, Kardie, DO     Family History  Problem Relation Age of Onset   Asthma Mother    Heart attack Mother    Stroke Father    Lung cancer Sister    Lung cancer Sister    Lung cancer Sister     Social History   Socioeconomic History   Marital status: Married    Spouse name: Not on file   Number of children: Not on file   Years of education: Not on file   Highest education level: Not on file  Occupational History   Not on file  Tobacco Use   Smoking status: Former    Types: Cigars   Smokeless tobacco: Current    Types: Snuff, Chew   Tobacco comments:    01/26/20 dips still last cigar was in 1976   Substance and Sexual Activity   Alcohol use: No   Drug use: Never   Sexual activity: Not on file  Other Topics Concern   Not on file  Social History Narrative   Not on file   Social Drivers of Health   Financial Resource Strain: Not on file  Food Insecurity: Not on file  Transportation Needs: Not on file  Physical Activity: Not on file  Stress: Not on file  Social Connections: Not on file    Review of Systems: A 12 point ROS discussed and pertinent positives are indicated in the HPI above.  All other systems are negative.  Review of Systems  Vital Signs: BP (!) 147/67 (BP Location: Right Arm)   Pulse (!) 55  Resp 15   SpO2 97%   Physical Exam Constitutional:      Appearance: Normal appearance.  HENT:     Mouth/Throat:     Mouth: Mucous membranes are moist.     Pharynx: Oropharynx is clear.  Cardiovascular:     Rate and Rhythm: Normal rate and regular rhythm.     Heart sounds: Normal heart sounds.  Pulmonary:     Effort: Pulmonary effort is normal. No respiratory distress.     Breath sounds: Normal breath sounds.  Musculoskeletal:     Comments: Palpable tenderness low back, no defects  Skin:    General: Skin is warm and dry.  Neurological:     General: No focal deficit present.     Mental Status: He is alert and oriented to person, place, and time.  Psychiatric:        Mood and Affect: Mood normal.        Thought Content: Thought content normal.        Judgment: Judgment normal.     Imaging: No results found.  Labs:  CBC: Recent Labs    04/19/23 1723 06/05/23 0705  WBC 6.6 4.6  HGB 13.8 14.1  HCT 42.3 42.8  PLT 175 162    COAGS: Recent Labs    04/01/23 1134 04/22/23 1541 04/29/23 1158 06/05/23 0705  INR 2.0 1.5* 1.4* 1.0    BMP: Recent Labs    04/19/23 1723 06/05/23 0705  NA 136 135  K 4.0 4.0  CL 104 102  CO2 23 24  GLUCOSE 163* 120*  BUN 15 16  CALCIUM 9.0 8.9  CREATININE 1.09 1.04  GFRNONAA >60 >60    LIVER  FUNCTION TESTS: Recent Labs    04/19/23 1723  BILITOT 0.6  AST 21  ALT 16  ALKPHOS 62  PROT 6.5  ALBUMIN 3.5     Assessment and Plan: Acute symptomatic L4 compression fracture Risks and benefits of kyphoplasty were discussed with the patient including, but not limited to education regarding the natural healing process of compression fractures without intervention, bleeding, infection, cement migration which may cause spinal cord damage, paralysis, pulmonary embolism or even death. Pt agreeable to proceed. Labs reviewed.   This interventional procedure involves the use of X-rays and because of the nature of the planned procedure, it is possible that we will have prolonged use of X-ray fluoroscopy.  Potential radiation risks to you include (but are not limited to) the following: - A slightly elevated risk for cancer  several years later in life. This risk is typically less than 0.5% percent. This risk is low in comparison to the normal incidence of human cancer, which is 33% for women and 50% for men according to the [employer]. - Radiation induced injury can include skin redness, resembling a rash, tissue breakdown / ulcers and hair loss (which can be temporary or permanent).   The likelihood of either of these occurring depends on the difficulty of the procedure and whether you are sensitive to radiation due to previous procedures, disease, or genetic conditions.   All of the patient's questions were answered, patient is agreeable to proceed.  Consent signed and in chart.    Electronically Signed: Brayton El, PA-C 06/05/2023, 8:30 AM   I spent a total of 20 minutes in face to face in clinical consultation, greater than 50% of which was counseling/coordinating care for L4 kyphoplasty

## 2023-06-05 NOTE — Discharge Instructions (Addendum)
INR.  No stooping, bending or lifting weights above 10 pounds for 2 weeks.  No driving for 2 weeks.  Use a walker to ambulate for 2 weeks.  Follow-up with referring MD in 2 weeks as needed.

## 2023-06-05 NOTE — Procedures (Signed)
INR  Status post L4 balloon kyphoplasty.  Bi pedicular approach.  No acute complications.    Patient tolerated the procedure well.    Fatima Sanger MD.

## 2023-06-05 NOTE — Telephone Encounter (Signed)
Called spoke to Kenney Houseman RN  at  [facility] Short stay   Informed her patient does not need  another  INR today . He had one earlier.  Patient would need to keep appointment on 06/10/23.   Per  Kenney Houseman  , Dr Corliss Skains stated patient can restart Anticoagulant  tomorrow 06/06/23.  Per Kenney Houseman, Dr Elberta Fortis  round on patient and states patient can wait until appointment 06/10/23 to address anticoagulant dosing and start.   Kenney Houseman will give information to patient.

## 2023-06-06 LAB — SURGICAL PATHOLOGY

## 2023-06-10 ENCOUNTER — Ambulatory Visit: Payer: Medicare Other | Attending: Cardiology

## 2023-06-10 DIAGNOSIS — I484 Atypical atrial flutter: Secondary | ICD-10-CM

## 2023-06-10 DIAGNOSIS — Z7901 Long term (current) use of anticoagulants: Secondary | ICD-10-CM

## 2023-06-10 LAB — POCT INR: INR: 1 — AB (ref 2.0–3.0)

## 2023-06-10 MED ORDER — APIXABAN 5 MG PO TABS: 5.0000 mg | ORAL_TABLET | Freq: Two times a day (BID) | ORAL | 5 refills | Status: DC

## 2023-06-10 NOTE — Patient Instructions (Signed)
Description   START taking Eliquis 5mg  two times daily.  Amio 100mg  daily (using 200mg  tab & taking 1/2 tablet daily).   [facility] [phone]

## 2023-06-18 ENCOUNTER — Ambulatory Visit: Payer: Medicare Other | Admitting: Cardiology

## 2023-08-19 ENCOUNTER — Telehealth (HOSPITAL_COMMUNITY): Payer: Self-pay

## 2023-08-19 DIAGNOSIS — I4819 Other persistent atrial fibrillation: Secondary | ICD-10-CM

## 2023-08-19 DIAGNOSIS — Z01812 Encounter for preprocedural laboratory examination: Secondary | ICD-10-CM

## 2023-08-19 NOTE — Telephone Encounter (Signed)
 Attempted to reach patient to discuss upcoming procedure, no answer. Left VM for patient to return call.

## 2023-08-19 NOTE — Telephone Encounter (Addendum)
 Spoke with patient to complete pre-procedure call.     New medical conditions?  Pt reports his PCP office diagnosed him with RSV on 08/13/23. He completed course of Azithromycin and has 6-7 days remaining of Prednisone. He reports symptoms are improving but still has fatigue and cough present. Will follow up with patient the week of 09/01/23 to re-evaluate symptoms.   Pre-procedure testing scheduled: CT on 08/22/23. Reviewed the night before and day of test instructions with patient, date/time and directions on where to report for test. Patient voiced understanding.   Lab work ordered- plan to obtain by 08/20/23 at [facility] in [place].   Confirmed patient is taking Eliquis and will continue taking medication before procedure or it may need to be rescheduled.  Confirmed patient is scheduled for Atrial Fibrillation Ablation on Thursday, May 1 with Dr. Loman Brooklyn. Instructed patient to arrive at the Main Entrance A at [facility]: [address] [place], [place] [address] and check in at Admitting at 10:30 AM.  Advised of plan to go home the same day and will only stay overnight if medically necessary. You MUST have a responsible adult to drive you home and MUST be with you the first 24 hours after you arrive home or your procedure could be cancelled.  Patient verbalized understanding to information provided and is agreeable to proceed with procedure.

## 2023-08-20 LAB — BASIC METABOLIC PANEL WITH GFR
BUN/Creatinine Ratio: 31 — ABNORMAL HIGH (ref 10–24)
BUN: 33 mg/dL — ABNORMAL HIGH (ref 8–27)
CO2: 24 mmol/L (ref 20–29)
Calcium: 9.6 mg/dL (ref 8.6–10.2)
Chloride: 97 mmol/L (ref 96–106)
Creatinine, Ser: 1.06 mg/dL (ref 0.76–1.27)
Glucose: 150 mg/dL — ABNORMAL HIGH (ref 70–99)
Potassium: 5 mmol/L (ref 3.5–5.2)
Sodium: 138 mmol/L (ref 134–144)
eGFR: 73 mL/min/{1.73_m2} (ref >59)

## 2023-08-20 LAB — CBC
Hematocrit: 49 % (ref 37.5–51.0)
Hemoglobin: 16.1 g/dL (ref 13.0–17.7)
MCH: 29.4 pg (ref 26.6–33.0)
MCHC: 32.9 g/dL (ref 31.5–35.7)
MCV: 89 fL (ref 79–97)
Platelets: 220 10*3/uL (ref 150–450)
RBC: 5.48 x10E6/uL (ref 4.14–5.80)
RDW: 13 % (ref 11.6–15.4)
WBC: 10 10*3/uL (ref 3.4–10.8)

## 2023-08-21 ENCOUNTER — Telehealth (HOSPITAL_COMMUNITY): Payer: Self-pay | Admitting: *Deleted

## 2023-08-21 NOTE — Telephone Encounter (Signed)
Attempted to call patient regarding upcoming cardiac CT appointment. Unable to leave VM (VM not set up). Johney Frame RN Navigator Cardiac Imaging [facility] [phone] Office

## 2023-08-22 ENCOUNTER — Other Ambulatory Visit: Payer: Self-pay | Admitting: Cardiology

## 2023-08-22 ENCOUNTER — Ambulatory Visit (HOSPITAL_COMMUNITY)
Admission: RE | Admit: 2023-08-22 | Discharge: 2023-08-22 | Disposition: A | Discharge status: Home / Self Care | Source: Ambulatory Visit | Attending: Cardiology | Admitting: Cardiology

## 2023-08-22 DIAGNOSIS — I4819 Other persistent atrial fibrillation: Secondary | ICD-10-CM | POA: Insufficient documentation

## 2023-08-22 MED ORDER — IOHEXOL 350 MG/ML SOLN
95.0000 mL | Freq: Once | INTRAVENOUS | Status: AC | PRN
  Administered 2023-08-22: 95 mL via INTRAVENOUS

## 2023-08-22 NOTE — Telephone Encounter (Signed)
 Spoke with pt and he is aware of his CT this morning at 11:30 and to arrive at 11:00 AM. He understands his instructions and where to go.  I will mail his Ablation Instruction letter to him - I verified his address. He will review and call me with any questions or concerns.

## 2023-08-27 ENCOUNTER — Ambulatory Visit: Payer: Medicare Other | Attending: Cardiology | Admitting: Cardiology

## 2023-08-27 ENCOUNTER — Encounter: Payer: Self-pay | Admitting: Cardiology

## 2023-08-27 VITALS — BP 128/74 | HR 112 | Ht 71.0 in | Wt 179.4 lb

## 2023-08-27 DIAGNOSIS — E119 Type 2 diabetes mellitus without complications: Secondary | ICD-10-CM | POA: Diagnosis not present

## 2023-08-27 DIAGNOSIS — E782 Mixed hyperlipidemia: Secondary | ICD-10-CM | POA: Diagnosis not present

## 2023-08-27 DIAGNOSIS — Z794 Long term (current) use of insulin: Secondary | ICD-10-CM

## 2023-08-27 DIAGNOSIS — I480 Paroxysmal atrial fibrillation: Secondary | ICD-10-CM

## 2023-08-27 NOTE — Progress Notes (Signed)
 Cardiology Office Note:    Date:  08/27/2023   ID:  Herbert Luster Sr., DOB 1946-03-12, MRN [id number]  PCP:  Erskine Emery, NP  Cardiologist:  Thomasene Ripple, DO  Electrophysiologist:  Regan Lemming, MD   Referring MD: Erskine Emery, NP     History of Present Illness:    Herbert GRANTZ Sr. is a 78 y.o. male with a hx of male with hyperlipidemia statin intolerant history of ventricular tachycardia on beta-blocker, Type 2 Diabetes, history of colon cancer, recently diagnosed atrial fibrillation when he was admitted at [facility] for COVID-19 infection, with drop in EF to 40 to 45%.   She presents with concerns about the cost of his anticoagulation medication. He reports that his current medication, warfarin, is becoming increasingly unaffordable. He has previously tried Eliquis and Xarelto, but these were also cost-prohibitive. The patient is also burdened by the inconvenience of weekly blood work required for warfarin monitoring, which he currently travels to this office for, despite living closer to the [place] office.   Herbert Evans also mentions that he has been experiencing increased dizziness and occasional ringing in his ears. He has not seen an ear doctor for these symptoms.  On a personal note, [name] expresses a desire to sell his home due to the large yard being too much for him to maintain given his bad knees and back. He is considering buying a camper and living in a campground.  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    DOE (dyspnea on exertion)    History of colon cancer    03-01-2005   s/p  sigmoid colectomy w/ negative nodes  (07-27-2019  per pt last colonoscopy 2019 , no recurrence)   History of Helicobacter pylori infection    09/ 2006   History of kidney stones    History of pneumonectomy    1967  left lower  lobe - severe PNA age 48   Hypertension    Mixed hyperlipidemia    NSVT (nonsustained ventricular tachycardia) (HCC)    Paroxysmal ventricular tachycardia (HCC)    PAT (paroxysmal atrial tachycardia) (HCC)    PNA (pneumonia) 10/13/2020   Pneumonia    PVC's (premature ventricular contractions)    RBBB (right bundle branch block)    Right ureteral stone    Type 2 diabetes mellitus (HCC)    followed by pcp --- (07-27-2019  checks blood sugar three times per week in am,  fasting sugar-- 125-160)   Wears dentures    upper    Past Surgical History:  Procedure Laterality Date   CARDIAC CATHETERIZATION  02-09-2002   dr Sharyn Lull  @[facility]    ef 50-55% w/ 10-15% LAD   CARDIAC CATHETERIZATION  06-28-2016   dr Beverely Pace @[facility]    for arrhythmia)--- normal coronaries, mild focal wall motion abnormallity of distal inferapical wall,  severe right innominate / subclavin tortuosity   CARDIOVERSION N/A 04/22/2022   Procedure: CARDIOVERSION;  Surgeon: Parke Poisson, MD;  Location: [facility] ENDOSCOPY;  Service: Cardiovascular;  Laterality: N/A;   CATARACT EXTRACTION W/ INTRAOCULAR LENS  IMPLANT, BILATERAL  2018   COLON RESECTION SIGMOID  03-01-2005   @[facility]    CYSTOSCOPY WITH RETROGRADE PYELOGRAM, URETEROSCOPY AND STENT PLACEMENT Right 08/03/2019   Procedure: CYSTOSCOPY WITH RIGHT  RETROGRADE PYELOGRAM, URETEROSCOPY HOLMIUMM LASER  AND STENT PLACEMENT;  Surgeon: Homero Luster, MD;  Location: [facility];  Service: Urology;  Laterality: Right;   CYSTOSCOPY/RETROGRADE/URETEROSCOPY/STONE EXTRACTION WITH BASKET  1970s   IR KYPHO LUMBAR INC FX REDUCE BONE BX UNI/BIL CANNULATION INC/IMAGING  06/05/2023   LUNG REMOVAL, PARTIAL Left 1967   lower lobe for pneumonia per pt   TONSILLECTOMY AND ADENOIDECTOMY  child    Current Medications: Current Meds  Medication Sig   albuterol (ACCUNEB) 0.63 MG/3ML nebulizer solution Take 1 ampule by nebulization every 6 (six) hours as needed for wheezing.   amiodarone (PACERONE)  200 MG tablet Take 0.5 tablets (100 mg total) by mouth daily.   apixaban (ELIQUIS) 5 MG TABS tablet Take 1 tablet (5 mg total) by mouth 2 (two) times daily.   aspirin EC 81 MG tablet Take 81 mg by mouth every morning.   Budeson-Glycopyrrol-Formoterol (BREZTRI AEROSPHERE) 160-9-4.8 MCG/ACT AERO Inhale into the lungs. INHALE 1-2 PUFFS AS NEEDED SHORTNESS OF BREATH OR WHEEZING.   donepezil (ARICEPT) 10 MG tablet Take 10 mg by mouth daily.   ezetimibe (ZETIA) 10 MG tablet Take 1 tablet by mouth once daily   FARXIGA 10 MG TABS tablet Take 10 mg by mouth daily.   gabapentin (NEURONTIN) 300 MG capsule Take 300 mg by mouth at bedtime.   glipiZIDE (GLUCOTROL XL) 2.5 MG 24 hr tablet Take 2.5 mg by mouth daily.   lidocaine (LIDODERM) 5 % Place 1 patch onto the skin daily. Remove & Discard patch within 12 hours or as directed by MD   memantine (NAMENDA) 5 MG tablet Take 5 mg by mouth 2 (two) times daily.   metFORMIN (GLUCOPHAGE) 1000 MG tablet Take 1,000 mg by mouth 2 (two) times daily with a meal.    methocarbamol (ROBAXIN) 500 MG tablet Take 1 tablet (500 mg total) by mouth every 8 (eight) hours as needed for muscle spasms.   metoprolol succinate (TOPROL-XL) 25 MG 24 hr tablet Take 1 tablet (25 mg total) by mouth daily.   NON FORMULARY CPAP at bedtime.   traZODone (DESYREL) 100 MG tablet Take 150 mg by mouth at bedtime.   TRESIBA FLEXTOUCH 200 UNIT/ML FlexTouch Pen TAKE 10 UNITS SUBCUTANEOUSLY ONCE AT BEDTIME FOR 90 DAYS     Allergies:   Atorvastatin and Rosuvastatin   Social History   Socioeconomic History   Marital status: Married    Spouse name: Not on file   Number of children: Not on file   Years of education: Not on file   Highest education level: Not on file  Occupational History   Not on file  Tobacco Use   Smoking status: Former    Types: Cigars   Smokeless tobacco: Current    Types: Snuff, Chew   Tobacco comments:    01/26/20 dips still last cigar was in 1976  Substance and  Sexual Activity   Alcohol use: No   Drug use: Never   Sexual activity: Not on file  Other Topics Concern   Not on file  Social History Narrative   Not on file   Social Drivers of Health   Financial Resource Strain: Not on file  Food Insecurity: Not on file  Transportation Needs: Not on file  Physical Activity: Not on file  Stress: Not on file  Social Connections: Not on file     Family History: The patient's family history includes Asthma in his mother; Heart attack in his mother; Lung cancer in his sister, sister, and sister; Stroke in his father.  ROS:   Review of Systems  Constitution: Negative for decreased appetite, fever and weight gain.  HENT: Negative for congestion, ear discharge, hoarse voice and sore throat.   Eyes: Negative for discharge, redness, vision loss in right eye and visual halos.  Cardiovascular: Negative for chest pain, dyspnea on exertion, leg swelling, orthopnea and palpitations.  Respiratory: Negative for cough, hemoptysis, shortness of breath and snoring.   Endocrine: Negative for heat intolerance and polyphagia.  Hematologic/Lymphatic: Negative for bleeding problem. Does not bruise/bleed easily.  Skin: Negative for flushing, nail changes, rash and suspicious lesions.  Musculoskeletal: Negative for arthritis, joint pain, muscle cramps, myalgias, neck pain and stiffness.  Gastrointestinal: Negative for abdominal pain, bowel incontinence, diarrhea and excessive appetite.  Genitourinary: Negative for decreased libido, genital sores and incomplete emptying.  Neurological: Negative for brief paralysis, focal weakness, headaches and loss of balance.  Psychiatric/Behavioral: Negative for altered mental status, depression and suicidal ideas.  Allergic/Immunologic: Negative for HIV exposure and persistent infections.    EKGs/Labs/Other Studies Reviewed:    The following studies were reviewed today:   EKG:  The ekg ordered today demonstrates sinus rhythm  HR 62 bpm 1st degress  Recent Labs: 04/19/2023: ALT 16; Magnesium 1.9 08/19/2023: BUN 33; Creatinine, Ser 1.06; Hemoglobin 16.1; Platelets 220; Potassium 5.0; Sodium 138  Recent Lipid Panel    Component Value Date/Time   CHOL 232 (H) 12/11/2020 0929   TRIG 116 12/11/2020 0929   HDL 66 12/11/2020 0929   CHOLHDL 3.5 12/11/2020 0929   LDLCALC 146 (H) 12/11/2020 0929    Physical Exam:    VS:  BP 128/74 (BP Location: Left Arm, Patient Position: Sitting, Cuff Size: Normal)   Pulse (!) 112   Ht 5\' 11"  (1.803 m)   Wt 179 lb 6.4 oz (81.4 kg)   SpO2 97%   BMI 25.02 kg/m     Wt Readings from Last 3 Encounters:  08/27/23 179 lb 6.4 oz (81.4 kg)  06/05/23 101 lb (45.8 kg)  06/02/23 185 lb (83.9 kg)     GEN: Well nourished, well developed in no acute distress HEENT: Normal NECK: No JVD; No carotid bruits LYMPHATICS: No lymphadenopathy CARDIAC: S1S2 noted,RRR, no murmurs, rubs, gallops RESPIRATORY:  Clear to auscultation without rales, wheezing or rhonchi  ABDOMEN: Soft, non-tender, non-distended, +bowel sounds, no guarding. EXTREMITIES: No edema, No cyanosis, no clubbing MUSCULOSKELETAL:  No deformity  SKIN: Warm and dry NEUROLOGIC:  Alert and oriented x 3, non-focal PSYCHIATRIC:  Normal affect, good insight  ASSESSMENT:    1. Paroxysmal atrial fibrillation (HCC)   2. Mixed hyperlipidemia   3. Insulin-requiring or dependent type II diabetes mellitus (HCC)    PLAN:    PAF - he is looking forward to his ablation on 5/1 but is a bit nervous. We spoke about this today. For now continue his amiodarone and the Eliquis. I will wait after his ablation 90 days and repeat his echocardiogram.   DM2 - cont current medication   Hyperlipidemia - cont current medication regimen   The patient is in agreement with the above plan. The patient left the office in stable condition.  The patient will follow up in   Medication Adjustments/Labs and Tests Ordered: Current medicines are reviewed  at length with the patient today.  Concerns regarding medicines are outlined above.  Orders Placed This Encounter  Procedures   CBC   Basic Metabolic Panel (BMET)   Magnesium   No orders of the defined types were placed in this encounter.  Patient Instructions  Medication Instructions:  Your physician recommends that you continue on your current medications as directed. Please refer to the Current Medication list given to you today.    *If you need a refill on your cardiac medications before your next appointment, please call your pharmacy*   Lab Work: Complete Blood Count  Magnesium  Comprehensive Metabolic Panel    If you have labs (blood work) drawn today and your tests are completely normal, you will receive your results only by: MyChart Message (if you have MyChart) OR A paper copy in the mail If you have any lab test that is abnormal or we need to change your treatment, we will call you to review the results.   Testing/Procedures: None    Follow-Up: At [facility], you and your health needs are our priority.  As part of our continuing mission to provide you with exceptional heart care, we have created designated Provider Care Teams.  These Care Teams include your primary Cardiologist (physician) and Advanced Practice Providers (APPs -  Physician Assistants and Nurse Practitioners) who all work together to provide you with the care you need, when you need it.  We recommend signing up for the patient portal called "MyChart".  Sign up information is provided on this After Visit Summary.  MyChart is used to connect with patients for Virtual Visits (Telemedicine).  Patients are able to view lab/test results, encounter notes, upcoming appointments, etc.  Non-urgent messages can be sent to your provider as well.   To learn more about what you can do with MyChart, go to [email or web address].    Your next appointment:   6 month(s)  The format for your next appointment:    In Person  Provider:   Jayvon Mounger, DO    Other Instructions      Adopting a Healthy Lifestyle.  Know what a healthy weight is for you (roughly BMI <25) and aim to maintain this   Aim for 7+ servings of fruits and vegetables daily   65-80+ fluid ounces of water or unsweet tea for healthy kidneys   Limit to max 1 drink of alcohol per day; avoid smoking/tobacco   Limit animal fats in diet for cholesterol and heart health - choose grass fed whenever available   Avoid highly processed foods, and foods high in saturated/trans fats   Aim for low stress - take time to unwind and care for your mental health   Aim for 150 min of moderate intensity exercise weekly for heart health, and weights twice weekly for bone health   Aim for 7-9 hours of sleep daily   When it comes to diets, agreement about the perfect plan isnt easy to find, even among the experts. Experts at the [facility] [employer] developed an idea known as the Healthy Eating Plate. Just imagine a plate divided into logical, healthy portions.   The emphasis is on diet quality:   Load up on vegetables and fruits - one-half of your plate: Aim for color and variety, and remember that potatoes dont count.   Go for whole grains - one-quarter of your plate: Whole wheat, barley, wheat berries, quinoa, oats, brown rice, and foods made with them. If you want pasta, go with whole wheat pasta.   Protein power - one-quarter of your plate: Fish, chicken, beans, and nuts are all healthy, versatile protein sources. Limit red meat.   The diet, however, does go beyond the plate, offering a few other suggestions.   Use  healthy plant oils, such as olive, canola, soy, corn, sunflower and peanut. Check the labels, and avoid partially hydrogenated oil, which have unhealthy trans fats.   If youre thirsty, drink water. Coffee and tea are good in moderation, but skip sugary drinks and limit milk and dairy products to one or two  daily servings.   The type of carbohydrate in the diet is more important than the amount. Some sources of carbohydrates, such as vegetables, fruits, whole grains, and beans-are healthier than others.   Finally, stay active  Signed, Jerryl Morin, DO  08/27/2023 10:16 PM    [facility]

## 2023-08-27 NOTE — Patient Instructions (Addendum)
 Medication Instructions:  Your physician recommends that you continue on your current medications as directed. Please refer to the Current Medication list given to you today.    *If you need a refill on your cardiac medications before your next appointment, please call your pharmacy*   Lab Work: Complete Blood Count  Magnesium  Comprehensive Metabolic Panel    If you have labs (blood work) drawn today and your tests are completely normal, you will receive your results only by: MyChart Message (if you have MyChart) OR A paper copy in the mail If you have any lab test that is abnormal or we need to change your treatment, we will call you to review the results.   Testing/Procedures: None    Follow-Up: At [facility], you and your health needs are our priority.  As part of our continuing mission to provide you with exceptional heart care, we have created designated Provider Care Teams.  These Care Teams include your primary Cardiologist (physician) and Advanced Practice Providers (APPs -  Physician Assistants and Nurse Practitioners) who all work together to provide you with the care you need, when you need it.  We recommend signing up for the patient portal called "MyChart".  Sign up information is provided on this After Visit Summary.  MyChart is used to connect with patients for Virtual Visits (Telemedicine).  Patients are able to view lab/test results, encounter notes, upcoming appointments, etc.  Non-urgent messages can be sent to your provider as well.   To learn more about what you can do with MyChart, go to [email or web address].    Your next appointment:   6 month(s)  The format for your next appointment:   In Person  Provider:   [name], DO    Other Instructions

## 2023-08-28 LAB — CBC
Hematocrit: 45.2 % (ref 37.5–51.0)
Hemoglobin: 15.1 g/dL (ref 13.0–17.7)
MCH: 30.3 pg (ref 26.6–33.0)
MCHC: 33.4 g/dL (ref 31.5–35.7)
MCV: 91 fL (ref 79–97)
Platelets: 156 10*3/uL (ref 150–450)
RBC: 4.99 x10E6/uL (ref 4.14–5.80)
RDW: 13 % (ref 11.6–15.4)
WBC: 7.9 10*3/uL (ref 3.4–10.8)

## 2023-08-28 LAB — BASIC METABOLIC PANEL WITH GFR
BUN/Creatinine Ratio: 15 (ref 10–24)
BUN: 13 mg/dL (ref 8–27)
CO2: 25 mmol/L (ref 20–29)
Calcium: 9.4 mg/dL (ref 8.6–10.2)
Chloride: 102 mmol/L (ref 96–106)
Creatinine, Ser: 0.87 mg/dL (ref 0.76–1.27)
Glucose: 125 mg/dL — ABNORMAL HIGH (ref 70–99)
Potassium: 4.9 mmol/L (ref 3.5–5.2)
Sodium: 140 mmol/L (ref 134–144)
eGFR: 90 mL/min/{1.73_m2} (ref >59)

## 2023-08-28 LAB — MAGNESIUM: Magnesium: 2 mg/dL (ref 1.6–2.3)

## 2023-08-31 ENCOUNTER — Encounter: Payer: Self-pay | Admitting: Cardiology

## 2023-09-01 ENCOUNTER — Other Ambulatory Visit: Payer: Self-pay | Admitting: Cardiology

## 2023-09-02 ENCOUNTER — Telehealth (HOSPITAL_COMMUNITY): Payer: Self-pay

## 2023-09-02 NOTE — Telephone Encounter (Signed)
 Call placed to patient to discuss upcoming procedure.   CT: completed.  Labs: completed.   Any recent signs of acute illness or been started on antibiotics? No. Patient reports he has completely recovered from RSV and finished course of antibiotics and prednisone.  Any new medications started? No Any medications to hold? Farxiga 3 days, adjust Tresiba, Metformin and Glipizide per instructions leter  Any missed doses of blood thinner? No Advised patient to continue taking ANTICOAGULANT: Eliquis  (Apixaban ) twice daily without missing any doses.  Medication instructions:  On the morning of your procedure DO NOT take any medication., including Eliquis  or the procedure may be rescheduled. Nothing to eat or drink after midnight prior to your procedure.  Confirmed patient is scheduled for Atrial Fibrillation Ablation on Thursday, May 1 with Dr. Agatha Horsfall. Instructed patient to arrive at the Main Entrance A at [facility]: [address] [place], [place] [address] and check in at Admitting at 10:30 AM.  Advised of plan to go home the same day and will only stay overnight if medically necessary. You MUST have a responsible adult to drive you home and MUST be with you the first 24 hours after you arrive home or your procedure could be cancelled.  Patient verbalized understanding to all instructions provided and agreed to proceed with procedure.

## 2023-09-10 NOTE — Pre-Procedure Instructions (Signed)
 Instructed patient on the following items: Arrival time 1000 Nothing to eat or drink after midnight No meds AM of procedure Responsible person to drive you home and stay with you for 24 hrs  Have you missed any doses of anti-coagulant Eliquis- takes twice a day, hasn't missed any doses.  Don't take dose morning of procedure.

## 2023-09-11 ENCOUNTER — Encounter (HOSPITAL_COMMUNITY)
Admission: RE | Disposition: A | Discharge status: Home / Self Care | Payer: Self-pay | Source: Home / Self Care | Attending: Cardiology

## 2023-09-11 ENCOUNTER — Ambulatory Visit (HOSPITAL_BASED_OUTPATIENT_CLINIC_OR_DEPARTMENT_OTHER): Admitting: Anesthesiology

## 2023-09-11 ENCOUNTER — Ambulatory Visit (HOSPITAL_COMMUNITY)
Admission: RE | Admit: 2023-09-11 | Discharge: 2023-09-11 | Disposition: A | Discharge status: Home / Self Care | Attending: Cardiology | Admitting: Cardiology

## 2023-09-11 ENCOUNTER — Encounter (HOSPITAL_COMMUNITY): Payer: Self-pay | Admitting: Cardiology

## 2023-09-11 ENCOUNTER — Ambulatory Visit (HOSPITAL_COMMUNITY): Admitting: Anesthesiology

## 2023-09-11 DIAGNOSIS — I4819 Other persistent atrial fibrillation: Secondary | ICD-10-CM

## 2023-09-11 DIAGNOSIS — I10 Essential (primary) hypertension: Secondary | ICD-10-CM

## 2023-09-11 DIAGNOSIS — I4891 Unspecified atrial fibrillation: Secondary | ICD-10-CM

## 2023-09-11 DIAGNOSIS — M1990 Unspecified osteoarthritis, unspecified site: Secondary | ICD-10-CM | POA: Insufficient documentation

## 2023-09-11 DIAGNOSIS — E119 Type 2 diabetes mellitus without complications: Secondary | ICD-10-CM | POA: Insufficient documentation

## 2023-09-11 DIAGNOSIS — Z87891 Personal history of nicotine dependence: Secondary | ICD-10-CM | POA: Insufficient documentation

## 2023-09-11 HISTORY — PX: ATRIAL FIBRILLATION ABLATION: EP1191

## 2023-09-11 LAB — GLUCOSE, CAPILLARY
Glucose-Capillary: 177 mg/dL — ABNORMAL HIGH (ref 70–99)
Glucose-Capillary: 157 mg/dL — ABNORMAL HIGH (ref 70–99)

## 2023-09-11 LAB — POCT ACTIVATED CLOTTING TIME: Activated Clotting Time: 268 s

## 2023-09-11 SURGERY — ATRIAL FIBRILLATION ABLATION
Anesthesia: General

## 2023-09-11 MED ORDER — ROCURONIUM BROMIDE 10 MG/ML (PF) SYRINGE
PREFILLED_SYRINGE | INTRAVENOUS | Status: DC | PRN
  Administered 2023-09-11: 60 mg via INTRAVENOUS
  Administered 2023-09-11 (×2): 10 mg via INTRAVENOUS

## 2023-09-11 MED ORDER — ATROPINE SULFATE 1 MG/10ML IJ SOSY
PREFILLED_SYRINGE | INTRAMUSCULAR | Status: DC | PRN
  Administered 2023-09-11: 1 mg via INTRAVENOUS

## 2023-09-11 MED ORDER — DEXAMETHASONE SODIUM PHOSPHATE 10 MG/ML IJ SOLN
INTRAMUSCULAR | Status: DC | PRN
  Administered 2023-09-11: 8 mg via INTRAVENOUS

## 2023-09-11 MED ORDER — LIDOCAINE 2% (20 MG/ML) 5 ML SYRINGE
INTRAMUSCULAR | Status: DC | PRN
  Administered 2023-09-11: 60 mg via INTRAVENOUS

## 2023-09-11 MED ORDER — SODIUM CHLORIDE 0.9 % IV SOLN: INTRAVENOUS | Status: DC

## 2023-09-11 MED ORDER — PROPOFOL 10 MG/ML IV BOLUS
INTRAVENOUS | Status: DC | PRN
  Administered 2023-09-11: 150 mg via INTRAVENOUS
  Administered 2023-09-11: 50 mg via INTRAVENOUS

## 2023-09-11 MED ORDER — SODIUM CHLORIDE 0.9 % IV SOLN: 250.0000 mL | INTRAVENOUS | Status: DC | PRN

## 2023-09-11 MED ORDER — ONDANSETRON HCL 4 MG/2ML IJ SOLN: 4.0000 mg | Freq: Four times a day (QID) | INTRAMUSCULAR | Status: DC | PRN

## 2023-09-11 MED ORDER — FENTANYL CITRATE (PF) 100 MCG/2ML IJ SOLN
INTRAMUSCULAR | Status: AC
  Filled 2023-09-11: qty 2

## 2023-09-11 MED ORDER — HEPARIN SODIUM (PORCINE) 1000 UNIT/ML IJ SOLN
INTRAMUSCULAR | Status: DC | PRN
Start: 2023-09-11 — End: 2023-09-11
  Administered 2023-09-11: 6000 [IU] via INTRAVENOUS
  Administered 2023-09-11: 14000 [IU] via INTRAVENOUS

## 2023-09-11 MED ORDER — ONDANSETRON HCL 4 MG/2ML IJ SOLN
INTRAMUSCULAR | Status: DC | PRN
  Administered 2023-09-11: 4 mg via INTRAVENOUS

## 2023-09-11 MED ORDER — FENTANYL CITRATE (PF) 100 MCG/2ML IJ SOLN
INTRAMUSCULAR | Status: DC | PRN
  Administered 2023-09-11: 100 ug via INTRAVENOUS

## 2023-09-11 MED ORDER — PHENYLEPHRINE HCL-NACL 20-0.9 MG/250ML-% IV SOLN
INTRAVENOUS | Status: DC | PRN
  Administered 2023-09-11: 25 ug/min via INTRAVENOUS

## 2023-09-11 MED ORDER — ACETAMINOPHEN 325 MG PO TABS: 650.0000 mg | ORAL_TABLET | ORAL | Status: DC | PRN

## 2023-09-11 MED ORDER — HEPARIN (PORCINE) IN NACL 1000-0.9 UT/500ML-% IV SOLN
INTRAVENOUS | Status: DC | PRN
  Administered 2023-09-11 (×3): 500 mL

## 2023-09-11 MED ORDER — PROTAMINE SULFATE 10 MG/ML IV SOLN
INTRAVENOUS | Status: DC | PRN
  Administered 2023-09-11: 40 mg via INTRAVENOUS

## 2023-09-11 MED ORDER — SUGAMMADEX SODIUM 200 MG/2ML IV SOLN
INTRAVENOUS | Status: DC | PRN
  Administered 2023-09-11: 160 mg via INTRAVENOUS

## 2023-09-11 SURGICAL SUPPLY — 20 items
BAG SNAP BAND KOVER 36X36 (MISCELLANEOUS) IMPLANT
BLANKET WARM UNDERBOD FULL ACC (MISCELLANEOUS) ×2 IMPLANT
CABLE PFA RX CATH CONN (CABLE) IMPLANT
CATH FARAWAVE ABLATION 31 (CATHETERS) IMPLANT
CATH GE 8FR SOUNDSTAR (CATHETERS) IMPLANT
CATH OCTARAY 2.0 F 3-3-3-3-3 (CATHETERS) IMPLANT
CATH WEBSTER BI DIR CS D-F CRV (CATHETERS) IMPLANT
CLOSURE MYNX CONTROL 6F/7F (Vascular Products) IMPLANT
CLOSURE PERCLOSE PROSTYLE (VASCULAR PRODUCTS) IMPLANT
COVER SWIFTLINK CONNECTOR (BAG) ×2 IMPLANT
DILATOR VESSEL 38 20CM 16FR (INTRODUCER) IMPLANT
GUIDEWIRE INQWIRE 1.5J.035X260 (WIRE) IMPLANT
KIT VERSACROSS CNCT FARADRIVE (KITS) IMPLANT
PACK EP LF (CUSTOM PROCEDURE TRAY) ×2 IMPLANT
PAD DEFIB RADIO PHYSIO CONN (PAD) ×2 IMPLANT
PATCH CARTO3 (PAD) IMPLANT
SHEATH AVANTI 11CM 9FR (SHEATH) IMPLANT
SHEATH FARADRIVE STEERABLE (SHEATH) IMPLANT
SHEATH PINNACLE 8F 10CM (SHEATH) IMPLANT
SHEATH PROBE COVER 6X72 (BAG) IMPLANT

## 2023-09-11 NOTE — Transfer of Care (Signed)
 Immediate Anesthesia Transfer of Care Note  Patient: Herbert DARDAR Sr.  Procedure(s) Performed: ATRIAL FIBRILLATION ABLATION  Patient Location: Cath Lab  Anesthesia Type:General  Level of Consciousness: drowsy and patient cooperative  Airway & Oxygen Therapy: Patient Spontanous Breathing and Patient connected to nasal cannula oxygen  Post-op Assessment: Report given to RN and Post -op Vital signs reviewed and stable  Post vital signs: Reviewed and stable  Last Vitals:  Vitals Value Taken Time  BP 134/77   Temp    Pulse 79   Resp 20   SpO2 93     Last Pain:  Vitals:   09/11/23 1012  TempSrc:   PainSc: 0-No pain         Complications: There were no known notable events for this encounter.

## 2023-09-11 NOTE — H&P (Signed)
  Electrophysiology Office Note:   Date:  09/11/2023  ID:  Herbert Maid Sr., DOB [birth year], MRN [id number]  Primary Cardiologist: [name], DO Primary Heart Failure: None Electrophysiologist: Laquana Villari Cortland Ding, MD      History of Present Illness:   Herbert MCCLENAGHAN Sr. is a 78 y.o. male with h/o atrial fibrillation, diabetes, colon cancer, VT, hyperlipidemia seen today for  for Electrophysiology evaluation of atrial fibrillation at the request of [name].    Today, denies symptoms of palpitations, chest pain, shortness of breath, orthopnea, PND, lower extremity edema, claudication, dizziness, presyncope, syncope, bleeding, or neurologic sequela. The patient is tolerating medications without difficulties. Plan ablation today.   EP Information / Studies Reviewed:    EKG is not ordered today. EKG from 04/19/23 reviewed which showed sinus rhythm, RBBB        Risk Assessment/Calculations:    CHA2DS2-VASc Score = 4   This indicates a 4.8% annual risk of stroke. The patient's score is based upon: CHF History: 0 HTN History: 1 Diabetes History: 1 Stroke History: 0 Vascular Disease History: 0 Age Score: 2 Gender Score: 0            Physical Exam:   VS:  BP (!) 147/69   Pulse (!) 58   Temp 98 F (36.7 C) (Oral)   Resp 16   Ht 5\' 10"  (1.778 m)   Wt 80.3 kg   SpO2 97%   BMI 25.40 kg/m    Wt Readings from Last 3 Encounters:  09/11/23 80.3 kg  08/27/23 81.4 kg  06/05/23 45.8 kg    GEN: No acute distress.   Neck: No JVD Cardiac: RRR, no murmurs, rubs, or gallops.  Respiratory: normal BS bilaterally. GI: Soft, nontender, non-distended  MS: No edema; No deformity. Neuro:  Nonfocal  Skin: warm and dry Psych: Normal affect    ASSESSMENT AND PLAN:    1.  Persistent atrial fibrillation: Herbert Maid Sr. has presented today for surgery, with the diagnosis of AF.  The various methods of treatment have been discussed with the patient and family. After consideration  of risks, benefits and other options for treatment, the patient has consented to  Procedure(s): Catheter ablation as a surgical intervention .  Risks include but not limited to complete heart block, stroke, esophageal damage, nerve damage, bleeding, vascular damage, tamponade, perforation, MI, and death. The patient's history has been reviewed, patient examined, no change in status, stable for surgery.  I have reviewed the patient's chart and labs.  Questions were answered to the patient's satisfaction.    Torrie Lafavor Lawana Pray, MD 09/11/2023 10:50 AM

## 2023-09-11 NOTE — Anesthesia Procedure Notes (Addendum)
 Procedure Name: Intubation Date/Time: 09/11/2023 11:34 AM  Performed by: Raymund Calix, CRNAPre-anesthesia Checklist: Patient identified, Emergency Drugs available, Suction available and Patient being monitored Patient Re-evaluated:Patient Re-evaluated prior to induction Oxygen Delivery Method: Circle system utilized Preoxygenation: Pre-oxygenation with 100% oxygen Induction Type: IV induction Ventilation: Mask ventilation without difficulty Laryngoscope Size: Mac and 4 Grade View: Grade II Tube type: Oral Tube size: 7.5 mm Number of attempts: 1 Airway Equipment and Method: Stylet and Oral airway Placement Confirmation: ETT inserted through vocal cords under direct vision, positive ETCO2 and breath sounds checked- equal and bilateral Secured at: 21 cm Tube secured with: Tape Dental Injury: Teeth and Oropharynx as per pre-operative assessment

## 2023-09-11 NOTE — Progress Notes (Signed)
 Patient arrived to cath lab holding bay 20. Patient alert/ oriented x 4 to voice. Follows commands and no neuro deficit noted. Bilateral groin dressings are dry/intact with no bleeding or hematoma noted. EKG obtained. VSS. Post activity and precautions explained. Patient verbalized understanding. Will continue to monitor per orders and protocol.[name] E

## 2023-09-11 NOTE — Progress Notes (Signed)
 No changes in assessment. Patient remains alert and following commands. Bilateral groins dry/intact. Patient transported via bed to procedural short stay for remainder of bedrest and monitoring.Herbert Evans

## 2023-09-11 NOTE — Progress Notes (Signed)
 Patient walked to the bathroom without difficulties. Bilateral groins level 0, clean, dry, and intact.

## 2023-09-11 NOTE — Discharge Instructions (Signed)

## 2023-09-11 NOTE — Anesthesia Preprocedure Evaluation (Signed)
 Anesthesia Evaluation  Patient identified by MRN, date of birth, ID band Patient awake    Reviewed: Allergy & Precautions, H&P , NPO status , Patient's Chart, lab work & pertinent test results  Airway Mallampati: II   Neck ROM: full    Dental   Pulmonary former smoker   breath sounds clear to auscultation       Cardiovascular hypertension, + dysrhythmias Atrial Fibrillation  Rhythm:irregular Rate:Normal     Neuro/Psych    GI/Hepatic   Endo/Other  diabetes, Type 2    Renal/GU      Musculoskeletal  (+) Arthritis ,    Abdominal   Peds  Hematology   Anesthesia Other Findings   Reproductive/Obstetrics                             Anesthesia Physical Anesthesia Plan  ASA: 3  Anesthesia Plan: General   Post-op Pain Management:    Induction: Intravenous  PONV Risk Score and Plan: 2 and Ondansetron , Dexamethasone , Midazolam  and Treatment may vary due to age or medical condition  Airway Management Planned: Oral ETT  Additional Equipment:   Intra-op Plan:   Post-operative Plan: Extubation in OR  Informed Consent: I have reviewed the patients History and Physical, chart, labs and discussed the procedure including the risks, benefits and alternatives for the proposed anesthesia with the patient or authorized representative who has indicated his/her understanding and acceptance.     Dental advisory given  Plan Discussed with: CRNA, Anesthesiologist and Surgeon  Anesthesia Plan Comments:        Anesthesia Quick Evaluation

## 2023-09-12 ENCOUNTER — Telehealth (HOSPITAL_COMMUNITY): Payer: Self-pay

## 2023-09-12 ENCOUNTER — Encounter (HOSPITAL_COMMUNITY): Payer: Self-pay | Admitting: Cardiology

## 2023-09-12 NOTE — Anesthesia Postprocedure Evaluation (Signed)
 Anesthesia Post Note  Patient: Herbert KARP Sr.  Procedure(s) Performed: ATRIAL FIBRILLATION ABLATION     Patient location during evaluation: PACU Anesthesia Type: General Level of consciousness: awake and alert Pain management: pain level controlled Vital Signs Assessment: post-procedure vital signs reviewed and stable Respiratory status: spontaneous breathing, nonlabored ventilation, respiratory function stable and patient connected to nasal cannula oxygen Cardiovascular status: blood pressure returned to baseline and stable Postop Assessment: no apparent nausea or vomiting Anesthetic complications: no   There were no known notable events for this encounter.  Last Vitals:  Vitals:   09/11/23 1530 09/11/23 1600  BP: 132/65 127/70  Pulse: 66 72  Resp: 11 18  Temp:    SpO2: 96% 95%    Last Pain:  Vitals:   09/11/23 1350  TempSrc:   PainSc: 0-No pain                 Elia Nunley S

## 2023-09-12 NOTE — Telephone Encounter (Signed)
 Spoke with patient to complete post procedure follow up call.  Patient reports no complications with groin sites.   Instructions reviewed with patient:  Remove large bandage at puncture site after 24 hours. It is normal to have bruising, tenderness and a pea or marble sized lump/knot at the groin site which can take up to three months to resolve.  Get help right away if you notice sudden swelling at the puncture site.  Check your puncture site every day for signs of infection: fever, redness, swelling, pus drainage, warmth, foul odor or excessive pain. If this occurs, please call the office at [phone], to speak with the nurse. Get help right away if your puncture site is bleeding and the bleeding does not stop after applying firm pressure to the area.  You may continue to have skipped beats/ atrial fibrillation during the first several months after your procedure.  It is very important not to miss any doses of your blood thinner Eliquis . Patient restarted taking this medication on yesterday, 09/11/23.   You will follow up with the Afib clinic on 10/09/23 and follow up with the APP on 12/12/23.   Patient verbalized understanding to all instructions provided.

## 2023-10-09 ENCOUNTER — Ambulatory Visit (HOSPITAL_COMMUNITY)
Admission: RE | Admit: 2023-10-09 | Discharge: 2023-10-09 | Disposition: A | Discharge status: Home / Self Care | Source: Ambulatory Visit | Attending: Internal Medicine | Admitting: Internal Medicine

## 2023-10-09 ENCOUNTER — Encounter (HOSPITAL_COMMUNITY): Payer: Self-pay | Admitting: Internal Medicine

## 2023-10-09 VITALS — BP 130/70 | HR 94 | Ht 70.0 in | Wt 179.0 lb

## 2023-10-09 DIAGNOSIS — D6869 Other thrombophilia: Secondary | ICD-10-CM | POA: Diagnosis not present

## 2023-10-09 DIAGNOSIS — Z5181 Encounter for therapeutic drug level monitoring: Secondary | ICD-10-CM

## 2023-10-09 DIAGNOSIS — I4819 Other persistent atrial fibrillation: Secondary | ICD-10-CM | POA: Diagnosis not present

## 2023-10-09 DIAGNOSIS — I480 Paroxysmal atrial fibrillation: Secondary | ICD-10-CM

## 2023-10-09 DIAGNOSIS — Z79899 Other long term (current) drug therapy: Secondary | ICD-10-CM

## 2023-10-09 NOTE — Progress Notes (Signed)
 Primary Care Physician: Marce Sensing, NP Primary Cardiologist: Jerryl Morin, DO Electrophysiologist: Will Cortland Ding, MD     Referring Physician: Dr. [name] is a 78 y.o. male with a history of HLD, VT, T2DM, systolic LV dysfunction, HTN, history of colon cancer, and atrial fibrillation who presents for consultation in the [facility]. Patient is on Eliquis  5 mg BID for a CHADS2VASC score of 5.  On evaluation today, patient is currently in likely Afib. S/p Afib ablation on 09/11/23 by Dr. Lawana Pray. No episodes of Afib since ablation. He is currently taking amiodarone  100 mg daily. He feels better overall since ablation. No chest pain or SOB. Leg sites healed without issue. No missed doses of Eliquis .  Today, he denies symptoms of orthopnea, PND, lower extremity edema, dizziness, presyncope, syncope, snoring, daytime somnolence, bleeding, or neurologic sequela. The patient is tolerating medications without difficulties and is otherwise without complaint today.    he has a BMI of Body mass index is 25.68 kg/m.Herbert Evans Filed Weights   10/09/23 1433  Weight: 81.2 kg    Current Outpatient Medications  Medication Sig Dispense Refill   albuterol  (ACCUNEB ) 0.63 MG/3ML nebulizer solution Take 1 ampule by nebulization every 6 (six) hours as needed for wheezing or shortness of breath.     amiodarone  (PACERONE ) 200 MG tablet Take 0.5 tablets (100 mg total) by mouth daily. 45 tablet 3   apixaban  (ELIQUIS ) 5 MG TABS tablet Take 1 tablet (5 mg total) by mouth 2 (two) times daily. 60 tablet 5   aspirin  EC 81 MG tablet Take 81 mg by mouth every morning.     Budeson-Glycopyrrol-Formoterol  (BREZTRI AEROSPHERE) 160-9-4.8 MCG/ACT AERO Inhale 2 puffs into the lungs daily.     donepezil (ARICEPT) 10 MG tablet Take 10 mg by mouth daily.     ezetimibe  (ZETIA ) 10 MG tablet Take 1 tablet by mouth once daily 90 tablet 2   FARXIGA 10 MG TABS tablet Take 10 mg by  mouth daily.     fluticasone  (FLONASE) 50 MCG/ACT nasal spray Place 1 spray into both nostrils daily as needed for allergies or rhinitis.     gabapentin (NEURONTIN) 300 MG capsule Take 300 mg by mouth at bedtime.     lidocaine  (LIDODERM ) 5 % Place 1 patch onto the skin daily. Remove & Discard patch within 12 hours or as directed by MD (Patient taking differently: Place 1 patch onto the skin daily as needed (back pain). Remove & Discard patch within 12 hours or as directed by MD) 14 patch 0   memantine  (NAMENDA ) 10 MG tablet Take 10 mg by mouth 2 (two) times daily.     metFORMIN (GLUCOPHAGE) 1000 MG tablet Take 1,000 mg by mouth 2 (two) times daily with a meal.      metoprolol  succinate (TOPROL -XL) 25 MG 24 hr tablet Take 1 tablet (25 mg total) by mouth daily. (Patient taking differently: Take 12.5 mg by mouth daily.) 90 tablet 0   NON FORMULARY CPAP at bedtime.     traZODone  (DESYREL ) 100 MG tablet Take 150 mg by mouth at bedtime.     TRESIBA FLEXTOUCH 200 UNIT/ML FlexTouch Pen TAKE 10 UNITS SUBCUTANEOUSLY ONCE AT BEDTIME FOR 90 DAYS     No current facility-administered medications for this encounter.    Atrial Fibrillation Management history:  Previous antiarrhythmic drugs: amiodarone  Previous cardioversions: 04/22/22 Previous ablations: 09/11/23 Anticoagulation history: Eliquis    ROS- All systems are reviewed  and negative except as per the HPI above.  Physical Exam: BP 130/70   Pulse 94   Ht 5\' 10"  (1.778 m)   Wt 81.2 kg   BMI 25.68 kg/m   GEN: Well nourished, well developed in no acute distress NECK: No JVD; No carotid bruits CARDIAC: Irregular rate and rhythm, no murmurs, rubs, gallops RESPIRATORY:  rhonchi right that clears with cough, decreased left lower lobe sounds ABDOMEN: Soft, non-tender, non-distended EXTREMITIES:  No edema; No deformity   EKG today demonstrates  Vent. rate 94 BPM PR interval * ms QRS duration 142 ms QT/QTcB 406/507 ms P-R-T axes * 139  8 Undetermined rhythm Right bundle branch block Left posterior fascicular block Bifascicular block Abnormal ECG When compared with ECG of 11-Sep-2023 13:15, Current undetermined rhythm precludes rhythm comparison, needs review  Echo 01/22/22 demonstrated   1. Left ventricular ejection fraction, by estimation, is 40 to 45%. The  left ventricle has mildly decreased function. The left ventricle  demonstrates global hypokinesis. Left ventricular diastolic function could  not be evaluated.   2. Right ventricular systolic function is normal. The right ventricular  size is mildly enlarged. There is normal pulmonary artery systolic  pressure. The estimated right ventricular systolic pressure is 29.8 mmHg.   3. The mitral valve is grossly normal. Mild to moderate mitral valve  regurgitation. No evidence of mitral stenosis.   4. The aortic valve is tricuspid. Aortic valve regurgitation is not  visualized. No aortic stenosis is present.   5. The inferior vena cava is normal in size with greater than 50%  respiratory variability, suggesting right atrial pressure of 3 mmHg.   ASSESSMENT & PLAN CHA2DS2-VASc Score = 5  The patient's score is based upon: CHF History: 1 HTN History: 1 Diabetes History: 1 Stroke History: 0 Vascular Disease History: 0 Age Score: 2 Gender Score: 0       ASSESSMENT AND PLAN: Persistent Atrial Fibrillation (ICD10:  I48.19) The patient's CHA2DS2-VASc score is 5, indicating a 7.2% annual risk of stroke.   S/p Afib ablation on 09/11/23 by Dr. Lawana Pray.  He is currently in what appears to be Afib with PVCs. Discussed ECG with primary EP. Will increase amiodarone  to 200 mg twice daily and have patient follow up in several weeks to reassess. If persists in Afib, will likely recommend cardioversion.    Secondary Hypercoagulable State (ICD10:  D68.69) The patient is at significant risk for stroke/thromboembolism based upon his CHA2DS2-VASc Score of 5.  Continue Apixaban   (Eliquis ).  No missed doses.     High risk medication monitoring (ICD10: [id number]) Patient requires ongoing monitoring for anti-arrhythmic medication which has the potential to cause life threatening arrhythmias or AV block. Qtc stable. Increase amiodarone  to 200 mg twice daily.     Follow up in 3 weeks to reassess rhythm.    Herbert Amber, PA-C  Afib Clinic [facility] [address] [place], [place] [address] [phone]

## 2023-10-10 ENCOUNTER — Other Ambulatory Visit (HOSPITAL_COMMUNITY): Payer: Self-pay | Admitting: *Deleted

## 2023-10-10 MED ORDER — AMIODARONE HCL 200 MG PO TABS: ORAL_TABLET | ORAL | 3 refills | Status: DC

## 2023-10-23 ENCOUNTER — Telehealth (HOSPITAL_COMMUNITY): Payer: Self-pay | Admitting: *Deleted

## 2023-10-23 NOTE — Telephone Encounter (Signed)
 Pt wife called in stating they noticed today his heart rates are in the 105-115 range BP 116/44 - on 12.5mg  of metoprolol  and amio 200mg  bid. Discussed with Caesar Caster PA will increase metoprolol  to 25mg  once a day. Follow up in place next week. Pt and wife in agreement with plan.

## 2023-10-30 ENCOUNTER — Ambulatory Visit (HOSPITAL_COMMUNITY)
Admission: RE | Admit: 2023-10-30 | Discharge: 2023-10-30 | Disposition: A | Discharge status: Home / Self Care | Source: Ambulatory Visit | Attending: Internal Medicine | Admitting: Internal Medicine

## 2023-10-30 VITALS — BP 100/60 | HR 92 | Ht 70.0 in | Wt 180.8 lb

## 2023-10-30 DIAGNOSIS — I4891 Unspecified atrial fibrillation: Secondary | ICD-10-CM | POA: Diagnosis not present

## 2023-10-30 DIAGNOSIS — Z79899 Other long term (current) drug therapy: Secondary | ICD-10-CM

## 2023-10-30 DIAGNOSIS — I484 Atypical atrial flutter: Secondary | ICD-10-CM

## 2023-10-30 DIAGNOSIS — I4819 Other persistent atrial fibrillation: Secondary | ICD-10-CM

## 2023-10-30 DIAGNOSIS — D6869 Other thrombophilia: Secondary | ICD-10-CM | POA: Diagnosis not present

## 2023-10-30 DIAGNOSIS — Z5181 Encounter for therapeutic drug level monitoring: Secondary | ICD-10-CM

## 2023-10-30 LAB — CBC
Hematocrit: 41.8 % (ref 37.5–51.0)
Hemoglobin: 13.3 g/dL (ref 13.0–17.7)
MCH: 29 pg (ref 26.6–33.0)
MCHC: 31.8 g/dL (ref 31.5–35.7)
MCV: 91 fL (ref 79–97)
Platelets: 149 10*3/uL — ABNORMAL LOW (ref 150–450)
RBC: 4.59 x10E6/uL (ref 4.14–5.80)
RDW: 13.9 % (ref 11.6–15.4)
WBC: 4.8 10*3/uL (ref 3.4–10.8)

## 2023-10-30 LAB — BASIC METABOLIC PANEL WITH GFR
BUN/Creatinine Ratio: 16 (ref 10–24)
BUN: 15 mg/dL (ref 8–27)
CO2: 24 mmol/L (ref 20–29)
Calcium: 9.1 mg/dL (ref 8.6–10.2)
Chloride: 103 mmol/L (ref 96–106)
Creatinine, Ser: 0.94 mg/dL (ref 0.76–1.27)
Glucose: 220 mg/dL — ABNORMAL HIGH (ref 70–99)
Potassium: 4.5 mmol/L (ref 3.5–5.2)
Sodium: 139 mmol/L (ref 134–144)
eGFR: 84 mL/min/{1.73_m2} (ref >59)

## 2023-10-30 NOTE — Addendum Note (Signed)
 Encounter addended by: Missouri Amor, RN on: 10/30/2023 4:32 PM  Actions taken: New alternative orders accepted, Order list changed, Diagnosis association updated

## 2023-10-30 NOTE — Progress Notes (Signed)
 Primary Care Physician: Marce Sensing, NP Primary Cardiologist: Jerryl Morin, DO Electrophysiologist: Will Cortland Ding, MD     Referring Physician: Dr. [name] is a 78 y.o. male with a history of HLD, VT, T2DM, systolic LV dysfunction, HTN, history of colon cancer, and atrial fibrillation who presents for consultation in the [facility]. Patient is on Eliquis  5 mg BID for a CHADS2VASC score of 5.  On evaluation today, patient is currently in likely Afib. S/p Afib ablation on 09/11/23 by Dr. Lawana Pray. No episodes of Afib since ablation. He is currently taking amiodarone  100 mg daily. He feels better overall since ablation. No chest pain or SOB. Leg sites healed without issue. No missed doses of Eliquis .  On follow up 10/30/23, he is currently in atrial flutter. After last office visit, his amiodarone  was increased to 200 mg BID for a reload. He feels tired. No missed doses of Eliquis .  Today, he denies symptoms of orthopnea, PND, lower extremity edema, dizziness, presyncope, syncope, snoring, daytime somnolence, bleeding, or neurologic sequela. The patient is tolerating medications without difficulties and is otherwise without complaint today.    he has a BMI of Body mass index is 25.94 kg/m.Aaron Aas Filed Weights   10/30/23 1306  Weight: 82 kg     Current Outpatient Medications  Medication Sig Dispense Refill   albuterol  (ACCUNEB ) 0.63 MG/3ML nebulizer solution Take 1 ampule by nebulization every 6 (six) hours as needed for wheezing or shortness of breath.     amiodarone  (PACERONE ) 200 MG tablet Take 1 tablet (200 mg total) by mouth 2 (two) times daily for 21 days, THEN 1 tablet (200 mg total) daily. 60 tablet 3   apixaban  (ELIQUIS ) 5 MG TABS tablet Take 1 tablet (5 mg total) by mouth 2 (two) times daily. 60 tablet 5   aspirin  EC 81 MG tablet Take 81 mg by mouth every morning.     Budeson-Glycopyrrol-Formoterol  (BREZTRI AEROSPHERE)  160-9-4.8 MCG/ACT AERO Inhale 2 puffs into the lungs daily. (Patient taking differently: Inhale 2 puffs into the lungs as needed.)     donepezil (ARICEPT) 10 MG tablet Take 10 mg by mouth daily.     ezetimibe  (ZETIA ) 10 MG tablet Take 1 tablet by mouth once daily 90 tablet 2   FARXIGA 10 MG TABS tablet Take 10 mg by mouth daily.     fluticasone  (FLONASE) 50 MCG/ACT nasal spray Place 1 spray into both nostrils daily as needed for allergies or rhinitis.     gabapentin (NEURONTIN) 300 MG capsule Take 300 mg by mouth at bedtime.     memantine  (NAMENDA ) 10 MG tablet Take 10 mg by mouth 2 (two) times daily.     metFORMIN (GLUCOPHAGE) 1000 MG tablet Take 1,000 mg by mouth 2 (two) times daily with a meal.      metoprolol  succinate (TOPROL -XL) 25 MG 24 hr tablet Take 1 tablet (25 mg total) by mouth daily. 90 tablet 0   NON FORMULARY CPAP at bedtime.     traZODone  (DESYREL ) 100 MG tablet Take 150 mg by mouth at bedtime.     TRESIBA FLEXTOUCH 200 UNIT/ML FlexTouch Pen TAKE 10 UNITS SUBCUTANEOUSLY ONCE AT BEDTIME FOR 90 DAYS     lidocaine  (LIDODERM ) 5 % Place 1 patch onto the skin daily. Remove & Discard patch within 12 hours or as directed by MD (Patient not taking: Reported on 10/30/2023) 14 patch 0   No current facility-administered medications for  this encounter.    Atrial Fibrillation Management history:  Previous antiarrhythmic drugs: amiodarone  Previous cardioversions: 04/22/22 Previous ablations: 09/11/23 Anticoagulation history: Eliquis    ROS- All systems are reviewed and negative except as per the HPI above.  Physical Exam: BP 100/60   Pulse 92   Ht 5' 10 (1.778 m)   Wt 82 kg   BMI 25.94 kg/m   GEN- The patient is well appearing, alert and oriented x 3 today.   Neck - no JVD or carotid bruit noted Lungs- Clear to ausculation bilaterally, normal work of breathing Heart- Regular rate and rhythm, no murmurs, rubs or gallops, PMI not laterally displaced Extremities- no clubbing,  cyanosis, or edema Skin - no rash or ecchymosis noted   EKG today demonstrates  Vent. 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 Left ventricular ejection fraction, by estimation, is 40 to 45%. The  left ventricle has mildly decreased function. The left ventricle  demonstrates global hypokinesis. Left ventricular diastolic function could  not be evaluated.   2. Right ventricular systolic function is normal. The right ventricular  size is mildly enlarged. There is normal pulmonary artery systolic  pressure. The estimated right ventricular systolic pressure is 29.8 mmHg.   3. The mitral valve is grossly normal. Mild to moderate mitral valve  regurgitation. No evidence of mitral stenosis.   4. The aortic valve is tricuspid. Aortic valve regurgitation is not  visualized. No aortic stenosis is present.   5. The inferior vena cava is normal in size with greater than 50%  respiratory variability, suggesting right atrial pressure of 3 mmHg.   ASSESSMENT & PLAN CHA2DS2-VASc Score = 5  The patient's score is based upon: CHF History: 1 HTN History: 1 Diabetes History: 1 Stroke History: 0 Vascular Disease History: 0 Age Score: 2 Gender Score: 0       ASSESSMENT AND PLAN: Persistent Atrial Fibrillation (ICD10:  I48.19) The patient's CHA2DS2-VASc score is 5, indicating a 7.2% annual risk of stroke.   S/p Afib ablation on 09/11/23 by Dr. Lawana Pray.  He is currently in atrial flutter. Review of ECG with Dr. Lawana Pray confirms appears to be atypical atrial flutter versus atrial tachycardia; given HR and patient symptoms will proceed with cardioversion. We discussed the procedure cardioversion to try to convert to  NSR. We discussed the risks vs benefits of this procedure and how ultimately we cannot predict whether a patient will have early return of arrhythmia post procedure but higher chance of maintaining normal rhythm with amiodarone  reload. After discussion, the patient wishes to proceed with cardioversion. Labs drawn today.   Informed Consent   Shared Decision Making/Informed Consent The risks (stroke, cardiac arrhythmias rarely resulting in the need for a temporary or permanent pacemaker, skin irritation or burns and complications associated with conscious sedation including aspiration, arrhythmia, respiratory failure and death), benefits (restoration of normal sinus rhythm) and alternatives of a direct current cardioversion were explained in detail to Mr. Rod and he agrees to proceed.  He will hold [place] 3 days prior to cardioversion.      Secondary Hypercoagulable State (ICD10:  D68.69) The patient is at significant risk for stroke/thromboembolism based upon his CHA2DS2-VASc Score of 5.  Continue Apixaban  (Eliquis ).  No missed  doses.    High risk medication monitoring (ICD10: Z79.899) Patient requires ongoing monitoring for anti-arrhythmic medication which has the potential to cause life threatening arrhythmias or AV block. Qtc stable. Continue amiodarone  200 mg twice daily and transition to once daily on 11/07/23.     Follow up with EP as scheduled.    Minnie Amber, PA-C  Afib Clinic [facility] [address] [place], [place] [address] [phone]

## 2023-10-30 NOTE — Addendum Note (Signed)
 Encounter addended by: Missouri Amor, RN on: 10/30/2023 4:24 PM  Actions taken: New alternative orders accepted

## 2023-10-30 NOTE — Patient Instructions (Signed)
 11/07/23 decrease amiodarone  to once daily    Cardioversion scheduled for:   - Arrive at the [facility] ([address]  and check in with ADMITTING at   - Do not eat or drink anything after midnight the night prior to your procedure.   - Take all your morning medication (except diabetic medications) with a sip of water prior to arrival.  - Do NOT miss any doses of your blood thinner - if you should miss a dose or take a dose more than 4 hours late -- please notify our office immediately.  - You will not be able to drive home after your procedure. Please ensure you have a responsible adult to drive you home. You will need someone with you for 24 hours post procedure.     - Expect to be in the procedural area approximately 2 hours.   - If you feel as if you go back into normal rhythm prior to scheduled cardioversion, please notify our office immediately.   If your procedure is canceled in the cardioversion suite you will be charged a cancellation fee.     Hold below medications 72 hours prior to scheduled procedure/anesthesia. Restart medication on the following day after scheduled procedure/anesthesia  Dapagliflozin (Farxiga)     For those patients who have a scheduled procedure/anesthesia on the same day of the week as their dose, hold the medication on the day of surgery.  They can take their scheduled dose the week before.  **Patients on the above medications scheduled for elective procedures that have not held the medication for the appropriate amount of time are at risk of cancellation or change in the anesthetic plan.

## 2023-10-30 NOTE — H&P (View-Only) (Signed)
 Primary Care Physician: Marce Sensing, NP Primary Cardiologist: Jerryl Morin, DO Electrophysiologist: Will Cortland Ding, MD     Referring Physician: Dr. [name] is a 78 y.o. male with a history of HLD, VT, T2DM, systolic LV dysfunction, HTN, history of colon cancer, and atrial fibrillation who presents for consultation in the [facility]. Patient is on Eliquis  5 mg BID for a CHADS2VASC score of 5.  On evaluation today, patient is currently in likely Afib. S/p Afib ablation on 09/11/23 by Dr. Lawana Pray. No episodes of Afib since ablation. He is currently taking amiodarone  100 mg daily. He feels better overall since ablation. No chest pain or SOB. Leg sites healed without issue. No missed doses of Eliquis .  On follow up 10/30/23, he is currently in atrial flutter. After last office visit, his amiodarone  was increased to 200 mg BID for a reload. He feels tired. No missed doses of Eliquis .  Today, he denies symptoms of orthopnea, PND, lower extremity edema, dizziness, presyncope, syncope, snoring, daytime somnolence, bleeding, or neurologic sequela. The patient is tolerating medications without difficulties and is otherwise without complaint today.    he has a BMI of Body mass index is 25.94 kg/m.Aaron Aas Filed Weights   10/30/23 1306  Weight: 82 kg     Current Outpatient Medications  Medication Sig Dispense Refill   albuterol  (ACCUNEB ) 0.63 MG/3ML nebulizer solution Take 1 ampule by nebulization every 6 (six) hours as needed for wheezing or shortness of breath.     amiodarone  (PACERONE ) 200 MG tablet Take 1 tablet (200 mg total) by mouth 2 (two) times daily for 21 days, THEN 1 tablet (200 mg total) daily. 60 tablet 3   apixaban  (ELIQUIS ) 5 MG TABS tablet Take 1 tablet (5 mg total) by mouth 2 (two) times daily. 60 tablet 5   aspirin  EC 81 MG tablet Take 81 mg by mouth every morning.     Budeson-Glycopyrrol-Formoterol  (BREZTRI AEROSPHERE)  160-9-4.8 MCG/ACT AERO Inhale 2 puffs into the lungs daily. (Patient taking differently: Inhale 2 puffs into the lungs as needed.)     donepezil (ARICEPT) 10 MG tablet Take 10 mg by mouth daily.     ezetimibe  (ZETIA ) 10 MG tablet Take 1 tablet by mouth once daily 90 tablet 2   FARXIGA 10 MG TABS tablet Take 10 mg by mouth daily.     fluticasone  (FLONASE) 50 MCG/ACT nasal spray Place 1 spray into both nostrils daily as needed for allergies or rhinitis.     gabapentin (NEURONTIN) 300 MG capsule Take 300 mg by mouth at bedtime.     memantine  (NAMENDA ) 10 MG tablet Take 10 mg by mouth 2 (two) times daily.     metFORMIN (GLUCOPHAGE) 1000 MG tablet Take 1,000 mg by mouth 2 (two) times daily with a meal.      metoprolol  succinate (TOPROL -XL) 25 MG 24 hr tablet Take 1 tablet (25 mg total) by mouth daily. 90 tablet 0   NON FORMULARY CPAP at bedtime.     traZODone  (DESYREL ) 100 MG tablet Take 150 mg by mouth at bedtime.     TRESIBA FLEXTOUCH 200 UNIT/ML FlexTouch Pen TAKE 10 UNITS SUBCUTANEOUSLY ONCE AT BEDTIME FOR 90 DAYS     lidocaine  (LIDODERM ) 5 % Place 1 patch onto the skin daily. Remove & Discard patch within 12 hours or as directed by MD (Patient not taking: Reported on 10/30/2023) 14 patch 0   No current facility-administered medications for  this encounter.    Atrial Fibrillation Management history:  Previous antiarrhythmic drugs: amiodarone  Previous cardioversions: 04/22/22 Previous ablations: 09/11/23 Anticoagulation history: Eliquis    ROS- All systems are reviewed and negative except as per the HPI above.  Physical Exam: BP 100/60   Pulse 92   Ht 5' 10 (1.778 m)   Wt 82 kg   BMI 25.94 kg/m   GEN- The patient is well appearing, alert and oriented x 3 today.   Neck - no JVD or carotid bruit noted Lungs- Clear to ausculation bilaterally, normal work of breathing Heart- Regular rate and rhythm, no murmurs, rubs or gallops, PMI not laterally displaced Extremities- no clubbing,  cyanosis, or edema Skin - no rash or ecchymosis noted   EKG today demonstrates  Vent. 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 Left ventricular ejection fraction, by estimation, is 40 to 45%. The  left ventricle has mildly decreased function. The left ventricle  demonstrates global hypokinesis. Left ventricular diastolic function could  not be evaluated.   2. Right ventricular systolic function is normal. The right ventricular  size is mildly enlarged. There is normal pulmonary artery systolic  pressure. The estimated right ventricular systolic pressure is 29.8 mmHg.   3. The mitral valve is grossly normal. Mild to moderate mitral valve  regurgitation. No evidence of mitral stenosis.   4. The aortic valve is tricuspid. Aortic valve regurgitation is not  visualized. No aortic stenosis is present.   5. The inferior vena cava is normal in size with greater than 50%  respiratory variability, suggesting right atrial pressure of 3 mmHg.   ASSESSMENT & PLAN CHA2DS2-VASc Score = 5  The patient's score is based upon: CHF History: 1 HTN History: 1 Diabetes History: 1 Stroke History: 0 Vascular Disease History: 0 Age Score: 2 Gender Score: 0       ASSESSMENT AND PLAN: Persistent Atrial Fibrillation (ICD10:  I48.19) The patient's CHA2DS2-VASc score is 5, indicating a 7.2% annual risk of stroke.   S/p Afib ablation on 09/11/23 by Dr. Lawana Pray.  He is currently in atrial flutter. Review of ECG with Dr. Lawana Pray confirms appears to be atypical atrial flutter versus atrial tachycardia; given HR and patient symptoms will proceed with cardioversion. We discussed the procedure cardioversion to try to convert to  NSR. We discussed the risks vs benefits of this procedure and how ultimately we cannot predict whether a patient will have early return of arrhythmia post procedure but higher chance of maintaining normal rhythm with amiodarone  reload. After discussion, the patient wishes to proceed with cardioversion. Labs drawn today.   Informed Consent   Shared Decision Making/Informed Consent The risks (stroke, cardiac arrhythmias rarely resulting in the need for a temporary or permanent pacemaker, skin irritation or burns and complications associated with conscious sedation including aspiration, arrhythmia, respiratory failure and death), benefits (restoration of normal sinus rhythm) and alternatives of a direct current cardioversion were explained in detail to Mr. Rod and he agrees to proceed.  He will hold [place] 3 days prior to cardioversion.      Secondary Hypercoagulable State (ICD10:  D68.69) The patient is at significant risk for stroke/thromboembolism based upon his CHA2DS2-VASc Score of 5.  Continue Apixaban  (Eliquis ).  No missed  doses.    High risk medication monitoring (ICD10: Z79.899) Patient requires ongoing monitoring for anti-arrhythmic medication which has the potential to cause life threatening arrhythmias or AV block. Qtc stable. Continue amiodarone  200 mg twice daily and transition to once daily on 11/07/23.     Follow up with EP as scheduled.    Minnie Amber, PA-C  Afib Clinic [facility] [address] [place], [place] [address] [phone]

## 2023-10-31 ENCOUNTER — Ambulatory Visit (HOSPITAL_COMMUNITY): Payer: Self-pay | Admitting: Internal Medicine

## 2023-11-03 ENCOUNTER — Other Ambulatory Visit: Payer: Self-pay | Admitting: Cardiology

## 2023-11-04 ENCOUNTER — Other Ambulatory Visit: Payer: Self-pay

## 2023-11-04 MED ORDER — METOPROLOL SUCCINATE ER 25 MG PO TB24
25.0000 mg | ORAL_TABLET | Freq: Every day | ORAL | 2 refills | Status: DC
Start: 2023-11-04 — End: 2023-11-06

## 2023-11-05 NOTE — Progress Notes (Signed)
 Pt called for pre procedure instructions. Arrival time 0730 NPO after midnight explained Instructed to take am meds with sip of water and confirmed blood thinner consistency. Instructed pt need for ride home tomorrow and have responsible adult with them for 24 hrs post procedure.

## 2023-11-06 ENCOUNTER — Ambulatory Visit (HOSPITAL_COMMUNITY): Admitting: Certified Registered"

## 2023-11-06 ENCOUNTER — Other Ambulatory Visit: Payer: Self-pay

## 2023-11-06 ENCOUNTER — Encounter (HOSPITAL_COMMUNITY): Payer: Self-pay | Admitting: Cardiology

## 2023-11-06 ENCOUNTER — Ambulatory Visit (HOSPITAL_BASED_OUTPATIENT_CLINIC_OR_DEPARTMENT_OTHER): Admitting: Certified Registered"

## 2023-11-06 ENCOUNTER — Encounter (HOSPITAL_COMMUNITY)
Admission: RE | Disposition: A | Discharge status: Home / Self Care | Payer: Self-pay | Source: Home / Self Care | Attending: Cardiology

## 2023-11-06 ENCOUNTER — Ambulatory Visit (HOSPITAL_COMMUNITY)
Admission: RE | Admit: 2023-11-06 | Discharge: 2023-11-06 | Disposition: A | Discharge status: Home / Self Care | Attending: Cardiology | Admitting: Cardiology

## 2023-11-06 DIAGNOSIS — I484 Atypical atrial flutter: Secondary | ICD-10-CM

## 2023-11-06 DIAGNOSIS — Z79899 Other long term (current) drug therapy: Secondary | ICD-10-CM | POA: Insufficient documentation

## 2023-11-06 DIAGNOSIS — Z7984 Long term (current) use of oral hypoglycemic drugs: Secondary | ICD-10-CM | POA: Diagnosis not present

## 2023-11-06 DIAGNOSIS — E785 Hyperlipidemia, unspecified: Secondary | ICD-10-CM

## 2023-11-06 DIAGNOSIS — I10 Essential (primary) hypertension: Secondary | ICD-10-CM

## 2023-11-06 DIAGNOSIS — Z87891 Personal history of nicotine dependence: Secondary | ICD-10-CM

## 2023-11-06 DIAGNOSIS — D6869 Other thrombophilia: Secondary | ICD-10-CM | POA: Diagnosis not present

## 2023-11-06 DIAGNOSIS — E119 Type 2 diabetes mellitus without complications: Secondary | ICD-10-CM | POA: Diagnosis not present

## 2023-11-06 DIAGNOSIS — I509 Heart failure, unspecified: Secondary | ICD-10-CM | POA: Insufficient documentation

## 2023-11-06 DIAGNOSIS — Z7901 Long term (current) use of anticoagulants: Secondary | ICD-10-CM | POA: Insufficient documentation

## 2023-11-06 DIAGNOSIS — Z794 Long term (current) use of insulin: Secondary | ICD-10-CM | POA: Diagnosis not present

## 2023-11-06 DIAGNOSIS — I110 Hypertensive heart disease with heart failure: Secondary | ICD-10-CM | POA: Diagnosis not present

## 2023-11-06 DIAGNOSIS — I4819 Other persistent atrial fibrillation: Secondary | ICD-10-CM | POA: Diagnosis not present

## 2023-11-06 HISTORY — PX: CARDIOVERSION: EP1203

## 2023-11-06 LAB — GLUCOSE, CAPILLARY: Glucose-Capillary: 134 mg/dL — ABNORMAL HIGH (ref 70–99)

## 2023-11-06 SURGERY — CARDIOVERSION (CATH LAB)
Anesthesia: General

## 2023-11-06 MED ORDER — METOPROLOL SUCCINATE ER 25 MG PO TB24: 25.0000 mg | ORAL_TABLET | Freq: Every day | ORAL | Status: DC

## 2023-11-06 MED ORDER — PROPOFOL 10 MG/ML IV BOLUS
INTRAVENOUS | Status: DC | PRN
Start: 2023-11-06 — End: 2023-11-06
  Administered 2023-11-06: 50 mg via INTRAVENOUS

## 2023-11-06 MED ORDER — LIDOCAINE 2% (20 MG/ML) 5 ML SYRINGE
INTRAMUSCULAR | Status: DC | PRN
  Administered 2023-11-06: 60 mg via INTRAVENOUS

## 2023-11-06 MED ORDER — SODIUM CHLORIDE 0.9% FLUSH: 3.0000 mL | Freq: Two times a day (BID) | INTRAVENOUS | Status: DC

## 2023-11-06 MED ORDER — SODIUM CHLORIDE 0.9% FLUSH: 3.0000 mL | INTRAVENOUS | Status: DC | PRN

## 2023-11-06 SURGICAL SUPPLY — 1 items: PAD DEFIB RADIO PHYSIO CONN (PAD) ×2 IMPLANT

## 2023-11-06 NOTE — CV Procedure (Signed)
   DIRECT CURRENT CARDIOVERSION  NAME:  HAYZEN LORENSON Sr.    MRN: [id number] DOB:  [birth year]    ADMIT DATE: 11/06/2023  Indication:  Symptomatic atrial fibrillation/flutter  Procedure Note:  The patient signed informed consent.  They have had had therapeutic anticoagulation with Eliquis  greater than 3 weeks.  Anesthesia was administered by Dr. Epifanio.  Adequate airway was maintained throughout and vital followed per protocol.  Patient was  cardioverted x 1 with 200J of biphasic synchronized energy.  They converted to sinus bradycardia.  There were no apparent complications.  The patient had normal neuro status and respiratory status post procedure with vitals stable as recorded elsewhere.    Follow up:  They will continue on current medical therapy and follow up with cardiology as scheduled. Post cardioversion patient was bradycardic with a pulse in the 40-50 bpm. Medication list illustrate that he is on amiodarone , Toprol -XL, Aricept, Namenda . Placed holding parameters on Toprol  XL-hold if SBP less than 100 or pulse less than 55. Wife updated over the phone.   Madonna Large, DO, [facility]  [facility]  [address] [place], [place] [address] [phone] 8:40 AM

## 2023-11-06 NOTE — Transfer of Care (Signed)
 Immediate Anesthesia Transfer of Care Note  Patient: Herbert FORBES Frost Sr.  Procedure(s) Performed: CARDIOVERSION  Patient Location: PACU and Cath Lab  Anesthesia Type:General  Level of Consciousness: awake and sedated  Airway & Oxygen Therapy: Patient Spontanous Breathing  Post-op Assessment: Report given to RN and Post -op Vital signs reviewed and stable  Post vital signs: Reviewed and stable  Last Vitals:  Vitals Value Taken Time  BP    Temp    Pulse    Resp    SpO2      Last Pain:  Vitals:   11/06/23 0808  TempSrc: Temporal  PainSc:          Complications: No notable events documented.

## 2023-11-06 NOTE — Anesthesia Preprocedure Evaluation (Addendum)
 Anesthesia Evaluation  Patient identified by MRN, date of birth, ID band Patient awake    Reviewed: Allergy & Precautions, H&P , NPO status , Patient's Chart, lab work & pertinent test results, reviewed documented beta blocker date and time   Airway Mallampati: II  TM Distance: >3 FB Neck ROM: Full    Dental no notable dental hx. (+) Edentulous Upper, Dental Advisory Given   Pulmonary neg pulmonary ROS, former smoker   Pulmonary exam normal breath sounds clear to auscultation       Cardiovascular hypertension, Pt. on medications and Pt. on home beta blockers + DOE  + dysrhythmias Atrial Fibrillation  Rhythm:Irregular Rate:Normal     Neuro/Psych negative neurological ROS  negative psych ROS   GI/Hepatic negative GI ROS, Neg liver ROS,,,  Endo/Other  diabetes, Type 2, Oral Hypoglycemic Agents    Renal/GU negative Renal ROS  negative genitourinary   Musculoskeletal  (+) Arthritis , Osteoarthritis,    Abdominal   Peds  Hematology negative hematology ROS (+)   Anesthesia Other Findings   Reproductive/Obstetrics negative OB ROS                             Anesthesia Physical Anesthesia Plan  ASA: 3  Anesthesia Plan: General   Post-op Pain Management: Minimal or no pain anticipated   Induction: Intravenous  PONV Risk Score and Plan: 2 and Propofol  infusion and Treatment may vary due to age or medical condition  Airway Management Planned: Mask  Additional Equipment:   Intra-op Plan:   Post-operative Plan:   Informed Consent: I have reviewed the patients History and Physical, chart, labs and discussed the procedure including the risks, benefits and alternatives for the proposed anesthesia with the patient or authorized representative who has indicated his/her understanding and acceptance.     Dental advisory given  Plan Discussed with: CRNA  Anesthesia Plan Comments:         Anesthesia Quick Evaluation

## 2023-11-06 NOTE — Discharge Instructions (Signed)

## 2023-11-06 NOTE — Anesthesia Postprocedure Evaluation (Signed)
 Anesthesia Post Note  Patient: Herbert WIERZBICKI Sr.  Procedure(s) Performed: CARDIOVERSION     Patient location during evaluation: Cath Lab Anesthesia Type: General Level of consciousness: awake and alert Pain management: pain level controlled Vital Signs Assessment: post-procedure vital signs reviewed and stable Respiratory status: spontaneous breathing, nonlabored ventilation and respiratory function stable Cardiovascular status: blood pressure returned to baseline and stable Postop Assessment: no apparent nausea or vomiting Anesthetic complications: no  No notable events documented.  Last Vitals:  Vitals:   11/06/23 0900 11/06/23 0910  BP: (!) 125/52   Pulse: (!) 50 (!) 48  Resp: 15 (!) 23  Temp:    SpO2: 98% 100%    Last Pain:  Vitals:   11/06/23 0843  TempSrc:   PainSc: 0-No pain                 Schylar Wuebker,W. EDMOND

## 2023-11-06 NOTE — Interval H&P Note (Signed)
 History and Physical Interval Note:  11/06/2023 8:27 AM  Herbert FORBES Frost Sr.  has presented today for surgery, with the diagnosis of ATYPICAL AFLUTTER.  The various methods of treatment have been discussed with the patient and family. After consideration of risks, benefits and other options for treatment, the patient has consented to  Procedure(s): CARDIOVERSION (N/A) as a surgical intervention.  The patient's history has been reviewed, patient examined, no change in status, stable for surgery.  I have reviewed the patient's chart and labs.  Questions were answered to the patient's satisfaction.    No missed doses of Eliquis  for the last 3 weeks.  Contact person: Wife.   Informed Consent   Shared Decision Making/Informed Consent The risks (stroke, cardiac arrhythmias rarely resulting in the need for a temporary or permanent pacemaker, skin irritation or burns and complications associated with conscious sedation including aspiration, arrhythmia, respiratory failure and death), benefits (restoration of normal sinus rhythm) and alternatives of a direct current cardioversion were explained in detail to Herbert Evans and he agrees to proceed.      Madonna Michele HAS, [facility]  A Division of  [facility] [address] [place], [place] [address]  [place], [place] [address]

## 2023-11-08 ENCOUNTER — Encounter (HOSPITAL_COMMUNITY): Payer: Self-pay | Admitting: Interventional Radiology

## 2023-11-17 ENCOUNTER — Ambulatory Visit (HOSPITAL_COMMUNITY): Admitting: Internal Medicine

## 2023-11-17 ENCOUNTER — Ambulatory Visit (HOSPITAL_COMMUNITY): Admission: RE | Admit: 2023-11-17 | Source: Ambulatory Visit | Admitting: Internal Medicine

## 2023-11-17 NOTE — Progress Notes (Incomplete)
 Primary Care Physician: Silvano Angeline FALCON, NP Primary Cardiologist: Dub Huntsman, DO Electrophysiologist: Will Gladis Norton, MD     Referring Physician: Dr. Norton Herbert Evans Herbert Sr. is a 78 y.o. male with a history of HLD, VT, T2DM, systolic LV dysfunction, HTN, history of colon cancer, and atrial fibrillation who presents for consultation in the [facility]. Patient is on Eliquis  5 mg BID for a CHADS2VASC score of 5.  On evaluation today, patient is currently in likely Afib. S/p Afib ablation on 09/11/23 by Dr. Norton. No episodes of Afib since ablation. He is currently taking amiodarone  100 mg daily. He feels better overall since ablation. No chest pain or SOB. Leg sites healed without issue. No missed doses of Eliquis .  On follow up 10/30/23, he is currently in atrial flutter. After last office visit, his amiodarone  was increased to 200 mg BID for a reload. He feels tired. No missed doses of Eliquis .  On follow up 11/17/23, he is currently in ***. S/p successful DCCV on 11/06/23. Toprol  held due to bradycardia. He is taking amiodarone  200 mg daily. No missed doses of Eliquis .   Today, he denies symptoms of orthopnea, PND, lower extremity edema, dizziness, presyncope, syncope, snoring, daytime somnolence, bleeding, or neurologic sequela. The patient is tolerating medications without difficulties and is otherwise without complaint today.    he has a BMI of There is no height or weight on file to calculate BMI.. There were no vitals filed for this visit.   Current Outpatient Medications  Medication Sig Dispense Refill   albuterol  (ACCUNEB ) 0.63 MG/3ML nebulizer solution Take 1 ampule by nebulization every 6 (six) hours as needed for wheezing or shortness of breath.     amiodarone  (PACERONE ) 200 MG tablet Take 1 tablet (200 mg total) by mouth 2 (two) times daily for 21 days, THEN 1 tablet (200 mg total) daily. 60 tablet 3   apixaban  (ELIQUIS ) 5 MG TABS  tablet Take 1 tablet (5 mg total) by mouth 2 (two) times daily. 60 tablet 5   aspirin  EC 81 MG tablet Take 81 mg by mouth every morning.     Budeson-Glycopyrrol-Formoterol  (BREZTRI AEROSPHERE) 160-9-4.8 MCG/ACT AERO Inhale 2 puffs into the lungs daily. (Patient taking differently: Inhale 2 puffs into the lungs at bedtime.)     donepezil (ARICEPT) 10 MG tablet Take 10 mg by mouth daily.     ezetimibe  (ZETIA ) 10 MG tablet Take 1 tablet by mouth once daily 90 tablet 2   FARXIGA 10 MG TABS tablet Take 10 mg by mouth daily.     fluticasone  (FLONASE) 50 MCG/ACT nasal spray Place 1 spray into both nostrils daily as needed for allergies or rhinitis.     gabapentin (NEURONTIN) 300 MG capsule Take 300 mg by mouth at bedtime.     lidocaine  (LIDODERM ) 5 % Place 1 patch onto the skin daily. Remove & Discard patch within 12 hours or as directed by MD (Patient taking differently: Place 1 patch onto the skin daily as needed (Pain). Remove & Discard patch within 12 hours or as directed by MD) 14 patch 0   memantine  (NAMENDA ) 10 MG tablet Take 10 mg by mouth 2 (two) times daily.     metFORMIN (GLUCOPHAGE) 1000 MG tablet Take 1,000 mg by mouth 2 (two) times daily with a meal.      metoprolol  succinate (TOPROL -XL) 25 MG 24 hr tablet Take 1 tablet (25 mg total) by mouth daily. Hold if  systolic blood pressure (top number) less than 100 mmHg or pulse less than 55 bpm.     NON FORMULARY CPAP at bedtime.     traZODone  (DESYREL ) 100 MG tablet Take 150 mg by mouth at bedtime.     TRESIBA FLEXTOUCH 200 UNIT/ML FlexTouch Pen Inject 10 Units into the skin at bedtime.     No current facility-administered medications for this visit.    Atrial Fibrillation Management history:  Previous antiarrhythmic drugs: amiodarone  Previous cardioversions: 04/22/22, 11/06/23 Previous ablations: 09/11/23 Anticoagulation history: Eliquis    ROS- All systems are reviewed and negative except as per the HPI above.  Physical Exam: There were  no vitals taken for this visit.  GEN- The patient is well appearing, alert and oriented x 3 today.   Neck - no JVD or carotid bruit noted Lungs- Clear to ausculation bilaterally, normal work of breathing Heart- ***Regular rate and rhythm, no murmurs, rubs or gallops, PMI not laterally displaced Extremities- no clubbing, cyanosis, or edema Skin - no rash or ecchymosis noted   EKG today demonstrates  ***  Echo 01/22/22 demonstrated   1. Left ventricular ejection fraction, by estimation, is 40 to 45%. The  left ventricle has mildly decreased function. The left ventricle  demonstrates global hypokinesis. Left ventricular diastolic function could  not be evaluated.   2. Right ventricular systolic function is normal. The right ventricular  size is mildly enlarged. There is normal pulmonary artery systolic  pressure. The estimated right ventricular systolic pressure is 29.8 mmHg.   3. The mitral valve is grossly normal. Mild to moderate mitral valve  regurgitation. No evidence of mitral stenosis.   4. The aortic valve is tricuspid. Aortic valve regurgitation is not  visualized. No aortic stenosis is present.   5. The inferior vena cava is normal in size with greater than 50%  respiratory variability, suggesting right atrial pressure of 3 mmHg.   ASSESSMENT & PLAN CHA2DS2-VASc Score = 5  The patient's score is based upon: CHF History: 1 HTN History: 1 Diabetes History: 1 Stroke History: 0 Vascular Disease History: 0 Age Score: 2 Gender Score: 0       ASSESSMENT AND PLAN: Persistent Atrial Fibrillation (ICD10:  I48.19) The patient's CHA2DS2-VASc score is 5, indicating a 7.2% annual risk of stroke.   S/p Afib ablation on 09/11/23 by Dr. Inocencio. S/p successful DCCV on 11/06/23.   He is currently in ***.    Secondary Hypercoagulable State (ICD10:  D68.69) The patient is at significant risk for stroke/thromboembolism based upon his CHA2DS2-VASc Score of 5.  Continue Apixaban   (Eliquis ).  No missed doses.    High risk medication monitoring (ICD10: [id number]) Patient requires ongoing monitoring for anti-arrhythmic medication which has the potential to cause life threatening arrhythmias or AV block. Qtc stable. Continue amiodarone  200 mg once daily.     Follow up ***.    Terra Pac, PA-C  Afib Clinic [facility] [address] [place], [place] [address] [phone]

## 2023-11-24 ENCOUNTER — Ambulatory Visit (HOSPITAL_COMMUNITY)
Admission: RE | Admit: 2023-11-24 | Discharge: 2023-11-24 | Disposition: A | Discharge status: Home / Self Care | Source: Ambulatory Visit | Attending: Internal Medicine | Admitting: Internal Medicine

## 2023-11-24 VITALS — BP 118/56 | HR 56 | Ht 71.0 in | Wt 179.6 lb

## 2023-11-24 DIAGNOSIS — I4819 Other persistent atrial fibrillation: Secondary | ICD-10-CM | POA: Diagnosis not present

## 2023-11-24 DIAGNOSIS — D6869 Other thrombophilia: Secondary | ICD-10-CM

## 2023-11-24 DIAGNOSIS — Z5181 Encounter for therapeutic drug level monitoring: Secondary | ICD-10-CM

## 2023-11-24 DIAGNOSIS — Z79899 Other long term (current) drug therapy: Secondary | ICD-10-CM

## 2023-11-24 DIAGNOSIS — I4891 Unspecified atrial fibrillation: Secondary | ICD-10-CM | POA: Diagnosis not present

## 2023-11-24 NOTE — Progress Notes (Signed)
 Primary Care Physician: Silvano Angeline FALCON, NP Primary Cardiologist: Dub Huntsman, DO Electrophysiologist: Will Gladis Norton, MD     Referring Physician: Dr. Norton [name] is a 78 y.o. male with a history of HLD, VT, T2DM, systolic LV dysfunction, HTN, history of colon cancer, and atrial fibrillation who presents for consultation in the [facility]. Patient is on Eliquis  5 mg BID for a CHADS2VASC score of 5.  On evaluation today, patient is currently in likely Afib. S/p Afib ablation on 09/11/23 by Dr. Norton. No episodes of Afib since ablation. He is currently taking amiodarone  100 mg daily. He feels better overall since ablation. No chest pain or SOB. Leg sites healed without issue. No missed doses of Eliquis .  On follow up 10/30/23, he is currently in atrial flutter. After last office visit, his amiodarone  was increased to 200 mg BID for a reload. He feels tired. No missed doses of Eliquis .  On follow up 11/24/23, he is currently in NSR. S/p successful DCCV on 11/06/23. Toprol  held due to bradycardia. He is taking amiodarone  200 mg daily. No missed doses of Eliquis .   Today, he denies symptoms of orthopnea, PND, lower extremity edema, dizziness, presyncope, syncope, snoring, daytime somnolence, bleeding, or neurologic sequela. The patient is tolerating medications without difficulties and is otherwise without complaint today.    he has a BMI of Body mass index is 25.05 kg/m.SABRA Filed Weights   11/24/23 1424  Weight: 81.5 kg     Current Outpatient Medications  Medication Sig Dispense Refill   albuterol  (ACCUNEB ) 0.63 MG/3ML nebulizer solution Take 1 ampule by nebulization every 6 (six) hours as needed for wheezing or shortness of breath.     amiodarone  (PACERONE ) 200 MG tablet Take 1 tablet (200 mg total) by mouth 2 (two) times daily for 21 days, THEN 1 tablet (200 mg total) daily. 60 tablet 3   apixaban  (ELIQUIS ) 5 MG TABS tablet Take 1  tablet (5 mg total) by mouth 2 (two) times daily. 60 tablet 5   aspirin  EC 81 MG tablet Take 81 mg by mouth every morning.     Budeson-Glycopyrrol-Formoterol  (BREZTRI AEROSPHERE) 160-9-4.8 MCG/ACT AERO Inhale 2 puffs into the lungs daily.     donepezil (ARICEPT) 10 MG tablet Take 10 mg by mouth daily.     ezetimibe  (ZETIA ) 10 MG tablet Take 1 tablet by mouth once daily 90 tablet 2   FARXIGA 10 MG TABS tablet Take 10 mg by mouth daily.     fluticasone  (FLONASE) 50 MCG/ACT nasal spray Place 1 spray into both nostrils daily as needed for allergies or rhinitis.     gabapentin (NEURONTIN) 300 MG capsule Take 300 mg by mouth at bedtime.     lidocaine  (LIDODERM ) 5 % Place 1 patch onto the skin daily. Remove & Discard patch within 12 hours or as directed by MD 14 patch 0   memantine  (NAMENDA ) 10 MG tablet Take 10 mg by mouth 2 (two) times daily.     metFORMIN (GLUCOPHAGE) 1000 MG tablet Take 1,000 mg by mouth 2 (two) times daily with a meal.      metoprolol  succinate (TOPROL -XL) 25 MG 24 hr tablet Take 1 tablet (25 mg total) by mouth daily. Hold if systolic blood pressure (top number) less than 100 mmHg or pulse less than 55 bpm. (Patient taking differently: Take 25 mg by mouth daily. Hold if systolic blood pressure (top number) less than 100 mmHg or pulse  less than 55 bpm., only taking this medication as needed)     NON FORMULARY CPAP at bedtime.     TRESIBA FLEXTOUCH 200 UNIT/ML FlexTouch Pen Inject 10 Units into the skin at bedtime.     traZODone  (DESYREL ) 100 MG tablet Take 150 mg by mouth at bedtime.     No current facility-administered medications for this encounter.    Atrial Fibrillation Management history:  Previous antiarrhythmic drugs: amiodarone  Previous cardioversions: 04/22/22, 11/06/23 Previous ablations: 09/11/23 Anticoagulation history: Eliquis    ROS- All systems are reviewed and negative except as per the HPI above.  Physical Exam: BP (!) 118/56   Pulse (!) 56   Ht 5' 11  (1.803 m)   Wt 81.5 kg   BMI 25.05 kg/m   GEN- The patient is well appearing, alert and oriented x 3 today.   Neck - no JVD or carotid bruit noted Lungs- Clear to ausculation bilaterally, normal work of breathing Heart- Regular rate and rhythm, no murmurs, rubs or gallops, PMI not laterally displaced Extremities- no clubbing, cyanosis, or edema Skin - no rash or ecchymosis noted   EKG today demonstrates  Vent. 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 Left ventricular ejection fraction, by estimation, is 40 to 45%. The  left ventricle has mildly decreased function. The left ventricle  demonstrates global hypokinesis. Left ventricular diastolic function could  not be evaluated.   2. Right ventricular systolic function is normal. The right ventricular  size is mildly enlarged. There is normal pulmonary artery systolic  pressure. The estimated right ventricular systolic pressure is 29.8 mmHg.   3. The mitral valve is grossly normal. Mild to moderate mitral valve  regurgitation. No evidence of mitral stenosis.   4. The aortic valve is tricuspid. Aortic valve regurgitation is not  visualized. No aortic stenosis is present.   5. The inferior vena cava is normal in size with greater than 50%  respiratory variability, suggesting right atrial pressure of 3 mmHg.   ASSESSMENT & PLAN CHA2DS2-VASc Score = 5  The patient's score is based upon: CHF History: 1 HTN History: 1 Diabetes History: 1 Stroke History: 0 Vascular Disease History: 0 Age Score: 2 Gender Score: 0       ASSESSMENT AND PLAN: Persistent Atrial Fibrillation (ICD10:  I48.19) The patient's CHA2DS2-VASc score is 5,  indicating a 7.2% annual risk of stroke.   S/p Afib ablation on 09/11/23 by Dr. Inocencio. S/p successful DCCV on 11/06/23.   He is currently in NSR. Toprol  is now PRN.    Secondary Hypercoagulable State (ICD10:  D68.69) The patient is at significant risk for stroke/thromboembolism based upon his CHA2DS2-VASc Score of 5.  Continue Apixaban  (Eliquis ).  No missed doses.    High risk medication monitoring (ICD10: [id number]) Patient requires ongoing monitoring for anti-arrhythmic medication which has the potential to cause life threatening arrhythmias or AV block. Qtc stable. Continue amiodarone  200 mg once daily.     Follow up as scheduled with EP.    Terra Pac, PA-C  Afib Clinic [facility] [address] [place], [place] [address] [phone]

## 2023-12-01 ENCOUNTER — Other Ambulatory Visit: Payer: Self-pay | Admitting: Cardiology

## 2023-12-01 DIAGNOSIS — I484 Atypical atrial flutter: Secondary | ICD-10-CM

## 2023-12-05 ENCOUNTER — Other Ambulatory Visit: Payer: Self-pay | Admitting: Pharmacist Clinician (PhC)/ Clinical Pharmacy Specialist

## 2023-12-05 DIAGNOSIS — I484 Atypical atrial flutter: Secondary | ICD-10-CM

## 2023-12-05 MED ORDER — APIXABAN 5 MG PO TABS: 5.0000 mg | ORAL_TABLET | Freq: Two times a day (BID) | ORAL | 1 refills | Status: AC

## 2023-12-05 NOTE — Telephone Encounter (Signed)
 Prescription refill request for Eliquis  received. Indication: AF Last office visit: 7/25 Scr: 0.94 Age: 78 Weight: 81.4 kg

## 2023-12-12 ENCOUNTER — Ambulatory Visit: Admitting: Physician Assistant

## 2023-12-21 NOTE — Progress Notes (Signed)
 Cardiology Office Note:  .   Date:  12/21/2023  ID:  Herbert FORBES Frost Sr., DOB 11-Jul-1945, MRN [id number] PCP: Silvano Angeline FALCON, NP  [facility] Providers Cardiologist:  Dub Huntsman, DO Electrophysiologist:  Will Gladis Norton, MD {  History of Present Illness: .   Herbert FORBES Frost Sr. is a 78 y.o. male w/PMHx of  Colon ca, HLD, DM, mild dementia Hx of LLLobectomy NSVT, PVCs (Dr. Edwyna in 2018 for NSVT. Note indicates normal EF by echo and normal coronaries by cath in early 2018 (report not available)  RBBB AFib, AFlutter  Referred to Dr. Norton for persistent AFib despite amiodarone , saw him 06/02/23,  Discussed OAC >> warfarin changed to Eliquis  and planned for ablation ans rhythm strategy Mentions his NSVT had previously been controlled w/BB tx  AFib ablation 09/11/23  Saw the AFib clinic team 10/09/23, on low dose amiodarone , doing well, no symptoms of Afib, no procedural concerns. EKG felt to be in AFib, amiodarone  increased to 200mg  BID  10/30/23 AFib clinic visit was in Aflutter, in review with Dr. Norton, an atypical AFlutter > planned for DCCV  11/06/23: DCCV > SB 40'sx-50 and given hold parameters for his Toprol   11/24/23, saw AFib clinic team, off Toprol , on amio daily.  Toprol  > PRN  Today's visit is scheduled as his post ablation visit ROS:   *** symptoms, AFib, brady *** eliquis , dose, bleeding, labs *** amio labs, continue for now, given off BB and hx of NSVTs *** echo 2023 LVEF 40-45% > plan repeat ~ 61mo of NSR *** then try to reduce amio  Arrhythmia/AAD hx Amiodarone  started 2022 for monitor showed episodes of NSVT, occasional PVCs as well as PAT Subsequent AFib AFib ablation 09/11/23  Studies Reviewed: SABRA    EKG done today and reviewed by myself:  ***  01/22/22: TTE 1. Left ventricular ejection fraction, by estimation, is 40 to 45%. The  left ventricle has mildly decreased function. The left ventricle  demonstrates global hypokinesis. Left  ventricular diastolic function could  not be evaluated.   2. Right ventricular systolic function is normal. The right ventricular  size is mildly enlarged. There is normal pulmonary artery systolic  pressure. The estimated right ventricular systolic pressure is 29.8 mmHg.   3. The mitral valve is grossly normal. Mild to moderate mitral valve  regurgitation. No evidence of mitral stenosis.   4. The aortic valve is tricuspid. Aortic valve regurgitation is not  visualized. No aortic stenosis is present.   5. The inferior vena cava is normal in size with greater than 50%  respiratory variability, suggesting right atrial pressure of 3 mmHg.   09/11/23: EPS/ablation CONCLUSIONS: 1. Sinus rhythm upon presentation.   2. Successful electrical isolation and anatomical encircling of all four pulmonary veins with pulse field ablation. 3. Ablation of posterior wall with pulse field ablation 4. No early apparent complications.   Risk Assessment/Calculations:    Physical Exam:   VS:  There were no vitals taken for this visit.   Wt Readings from Last 3 Encounters:  11/24/23 179 lb 9.6 oz (81.5 kg)  11/06/23 180 lb (81.6 kg)  10/30/23 180 lb 12.8 oz (82 kg)    GEN: Well nourished, well developed in no acute distress NECK: No JVD; No carotid bruits CARDIAC: ***RRR, no murmurs, rubs, gallops RESPIRATORY:  *** CTA b/l without rales, wheezing or rhonchi  ABDOMEN: Soft, non-tender, non-distended EXTREMITIES:*** No edema; No deformity   ASSESSMENT AND PLAN: .    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 stress test, cath, DCCV, TEE, etc)?   Press F2        :[id number]}     Dispo: ***  Signed, Charlies Macario Arthur, PA-C

## 2023-12-23 ENCOUNTER — Encounter: Payer: Self-pay | Admitting: Physician Assistant

## 2023-12-23 ENCOUNTER — Ambulatory Visit: Attending: Cardiology | Admitting: Physician Assistant

## 2023-12-23 VITALS — BP 114/56 | HR 66 | Ht 71.0 in | Wt 176.6 lb

## 2023-12-23 DIAGNOSIS — I4729 Other ventricular tachycardia: Secondary | ICD-10-CM | POA: Diagnosis not present

## 2023-12-23 DIAGNOSIS — I429 Cardiomyopathy, unspecified: Secondary | ICD-10-CM

## 2023-12-23 DIAGNOSIS — I4510 Unspecified right bundle-branch block: Secondary | ICD-10-CM

## 2023-12-23 DIAGNOSIS — Z5181 Encounter for therapeutic drug level monitoring: Secondary | ICD-10-CM | POA: Diagnosis not present

## 2023-12-23 DIAGNOSIS — I4819 Other persistent atrial fibrillation: Secondary | ICD-10-CM | POA: Diagnosis not present

## 2023-12-23 DIAGNOSIS — D6869 Other thrombophilia: Secondary | ICD-10-CM

## 2023-12-23 DIAGNOSIS — Z79899 Other long term (current) drug therapy: Secondary | ICD-10-CM

## 2023-12-23 NOTE — Patient Instructions (Signed)
 Medication Instructions:  Your physician recommends that you continue on your current medications as directed. Please refer to the Current Medication list given to you today.  *If you need a refill on your cardiac medications before your next appointment, please call your pharmacy*  Lab Work: BMET, TSH-TODAY If you have labs (blood work) drawn today and your tests are completely normal, you will receive your results only by: MyChart Message (if you have MyChart) OR A paper copy in the mail If you have any lab test that is abnormal or we need to change your treatment, we will call you to review the results.  Testing/Procedures: Your physician has requested that you have an echocardiogram in September. Echocardiography is a painless test that uses sound waves to create images of your heart. It provides your doctor with information about the size and shape of your heart and how well your heart's chambers and valves are working. This procedure takes approximately one hour. There are no restrictions for this procedure. Please do NOT wear cologne, perfume, aftershave, or lotions (deodorant is allowed). Please arrive 15 minutes prior to your appointment time.  Please note: We ask at that you not bring children with you during ultrasound (echo/ vascular) testing. Due to room size and safety concerns, children are not allowed in the ultrasound rooms during exams. Our front office staff cannot provide observation of children in our lobby area while testing is being conducted. An adult accompanying a patient to their appointment will only be allowed in the ultrasound room at the discretion of the ultrasound technician under special circumstances. We apologize for any inconvenience.   Follow-Up: At [facility], you and your health needs are our priority.  As part of our continuing mission to provide you with exceptional heart care, our providers are all part of one team.  This team includes your  primary Cardiologist (physician) and Advanced Practice Providers or APPs (Physician Assistants and Nurse Practitioners) who all work together to provide you with the care you need, when you need it.  Your next appointment:   2-3 month(s)  Provider:   Charlies Arthur, PA-C

## 2023-12-24 ENCOUNTER — Telehealth: Payer: Self-pay | Admitting: Physician Assistant

## 2023-12-24 ENCOUNTER — Ambulatory Visit: Payer: Self-pay | Admitting: Physician Assistant

## 2023-12-24 DIAGNOSIS — Z79899 Other long term (current) drug therapy: Secondary | ICD-10-CM

## 2023-12-24 LAB — BASIC METABOLIC PANEL WITH GFR
BUN/Creatinine Ratio: 16 (ref 10–24)
BUN: 16 mg/dL (ref 8–27)
CO2: 23 mmol/L (ref 20–29)
Calcium: 9.4 mg/dL (ref 8.6–10.2)
Chloride: 99 mmol/L (ref 96–106)
Creatinine, Ser: 1.02 mg/dL (ref 0.76–1.27)
Glucose: 171 mg/dL — ABNORMAL HIGH (ref 70–99)
Potassium: 4.2 mmol/L (ref 3.5–5.2)
Sodium: 137 mmol/L (ref 134–144)
eGFR: 76 mL/min/1.73 (ref >59)

## 2023-12-24 LAB — TSH: TSH: 1.86 u[IU]/mL (ref 0.450–4.500)

## 2023-12-24 NOTE — Telephone Encounter (Signed)
 Pt's wife returning call regarding lab results. Please advise.

## 2023-12-24 NOTE — Telephone Encounter (Signed)
 Lvm to call back for results and reccomendations

## 2023-12-24 NOTE — Telephone Encounter (Signed)
-----   Message from Charlies Macario Arthur sent at 12/24/2023  8:03 AM EDT ----- BMET was done, should have had CMET ordered, can we get LFTs done when he can please for amiodarone  surveillance Labs look ok, except sugar was high, close management with his PMD for his DM is important! ----- Message ----- From: Interface, [facility] Lab Results In Sent: 12/24/2023   1:35 AM EDT To: Charlies Macario Arthur, PA-C

## 2023-12-25 NOTE — Telephone Encounter (Signed)
 Wife Marletta) called to follow-up on patient's test results.

## 2023-12-25 NOTE — Telephone Encounter (Signed)
 Patient's wife (DPR) already received results from Charlies Arthur PA's covering CMA.

## 2023-12-25 NOTE — Addendum Note (Signed)
 Addended by: RUBBIE KERRI HERO on: 12/25/2023 03:15 PM   Modules accepted: Orders

## 2024-01-07 LAB — LAB REPORT - SCANNED
A1c: 6.6
EGFR: 67

## 2024-02-02 ENCOUNTER — Ambulatory Visit (HOSPITAL_COMMUNITY)
Admission: RE | Admit: 2024-02-02 | Discharge: 2024-02-02 | Disposition: A | Discharge status: Home / Self Care | Source: Ambulatory Visit | Attending: Cardiology | Admitting: Cardiology

## 2024-02-02 DIAGNOSIS — I088 Other rheumatic multiple valve diseases: Secondary | ICD-10-CM | POA: Diagnosis not present

## 2024-02-02 DIAGNOSIS — I4891 Unspecified atrial fibrillation: Secondary | ICD-10-CM | POA: Insufficient documentation

## 2024-02-02 DIAGNOSIS — I4510 Unspecified right bundle-branch block: Secondary | ICD-10-CM | POA: Diagnosis not present

## 2024-02-02 DIAGNOSIS — I10 Essential (primary) hypertension: Secondary | ICD-10-CM | POA: Insufficient documentation

## 2024-02-02 DIAGNOSIS — E785 Hyperlipidemia, unspecified: Secondary | ICD-10-CM | POA: Insufficient documentation

## 2024-02-02 DIAGNOSIS — E119 Type 2 diabetes mellitus without complications: Secondary | ICD-10-CM | POA: Insufficient documentation

## 2024-02-02 DIAGNOSIS — I429 Cardiomyopathy, unspecified: Secondary | ICD-10-CM | POA: Diagnosis present

## 2024-02-02 DIAGNOSIS — I4729 Other ventricular tachycardia: Secondary | ICD-10-CM | POA: Diagnosis not present

## 2024-02-02 LAB — ECHOCARDIOGRAM COMPLETE
Area-P 1/2: 2.96 cm2
S' Lateral: 3.8 cm

## 2024-02-08 ENCOUNTER — Other Ambulatory Visit: Payer: Self-pay | Admitting: Cardiology

## 2024-02-17 ENCOUNTER — Other Ambulatory Visit: Payer: Self-pay | Admitting: Cardiology

## 2024-02-19 MED ORDER — EZETIMIBE 10 MG PO TABS: 10.0000 mg | ORAL_TABLET | Freq: Every day | ORAL | 1 refills | Status: AC

## 2024-02-23 NOTE — Progress Notes (Deleted)
 Cardiology Office Note:  .   Date:  02/23/2024  ID:  Herbert FORBES Frost Sr., DOB February 15, 1946, MRN [id number] PCP: Silvano Angeline FALCON, NP  [facility] Providers Cardiologist:  Dub Huntsman, DO Electrophysiologist:  Will Gladis Norton, MD {  History of Present Illness: .   Herbert FORBES Frost Sr. is a 78 y.o. male w/PMHx of  Colon ca, HLD, DM, mild dementia Hx of LLLobectomy (reports 2 surgeries, both in his childhood, 72 and 78 y/o, told him 2/2 PNA) NSVT, PVCs (Dr. Edwyna in 2018 for NSVT. Note indicates normal EF by echo and normal coronaries by cath in early 2018 (report not available)  RBBB AFib, AFlutter  Referred to Dr. Norton for persistent AFib despite amiodarone , saw him 06/02/23,  Discussed OAC >> warfarin changed to Eliquis  and planned for ablation ans rhythm strategy Mentions his NSVT had previously been controlled w/BB tx  AFib ablation 09/11/23  Saw the AFib clinic team 10/09/23, on low dose amiodarone , doing well, no symptoms of Afib, no procedural concerns. EKG felt to be in AFib, amiodarone  increased to 200mg  BID  10/30/23 AFib clinic visit was in Aflutter, in review with Dr. Norton, an atypical AFlutter > planned for DCCV  11/06/23: DCCV > SB 40'sx-50 and given hold parameters for his Toprol   11/24/23, saw AFib clinic team, off Toprol , on amio daily.  Toprol  > PRN  I saw him 12/23/23 He is accompanied by his wife, she mentions he has some dementia so he may not be clear on details She confirms he is on daily dose of amiodarone  and PRN metoprolol  with no need for use.  He denies symptoms of AFib and does think he would recognize it No SOB Remains generally tired, but better then he had been. Still a bit off balance, and was hoping that would get better.  This is not new, goes back quite a while Not dizzy or lightheaded No near syncope or syncope No bleeding or signs of bleeding  Planned for an echo late September > if EF improved would look to stop amio at his  next visit  LVEF 50-55%   Today's visit is scheduled as his post ablation visit ROS:   *** symptoms, AFib, othewise *** stop amio?  (Hx of lobectomy), monitor off?   *** eliquis , dose, bleeding  Arrhythmia/AAD hx Amiodarone  started 2022 for monitor showed episodes of NSVT, occasional PVCs as well as PAT Subsequent AFib AFib ablation 09/11/23  Studies Reviewed: SABRA    EKG not done today  02/02/24: TTE 1. Left ventricular ejection fraction, by estimation, is 50 to 55%. The  left ventricle has low normal function. The left ventricle has no regional  wall motion abnormalities. Left ventricular diastolic parameters are  consistent with Grade III diastolic  dysfunction (restrictive).   2. Right ventricular systolic function is normal. The right ventricular  size is normal. There is normal pulmonary artery systolic pressure. The  estimated right ventricular systolic pressure is 35.5 mmHg.   3. Left atrial size was mildly dilated.   4. The mitral valve is normal in structure. Mild mitral valve  regurgitation. No evidence of mitral stenosis.   5. The aortic valve is tricuspid. Aortic valve regurgitation is not  visualized. Aortic valve sclerosis is present, with no evidence of aortic  valve stenosis.   6. Pulmonic valve regurgitation is moderate.   7. The inferior vena cava is normal in size with greater than 50%  respiratory variability, suggesting right atrial pressure of 3 mmHg.  Comparison(s): A prior study was performed on 01/23/2022. LVEF 40-45%,  global hypokinesis, mild to moderate MR, estimated RAP .   09/11/23: EPS/ablation CONCLUSIONS: 1. Sinus rhythm upon presentation.   2. Successful electrical isolation and anatomical encircling of all four pulmonary veins with pulse field ablation. 3. Ablation of posterior wall with pulse field ablation 4. No early apparent complications.  01/22/22: TTE 1. Left ventricular ejection fraction, by estimation, is 40 to 45%. The   left ventricle has mildly decreased function. The left ventricle  demonstrates global hypokinesis. Left ventricular diastolic function could  not be evaluated.   2. Right ventricular systolic function is normal. The right ventricular  size is mildly enlarged. There is normal pulmonary artery systolic  pressure. The estimated right ventricular systolic pressure is 29.8 mmHg.   3. The mitral valve is grossly normal. Mild to moderate mitral valve  regurgitation. No evidence of mitral stenosis.   4. The aortic valve is tricuspid. Aortic valve regurgitation is not  visualized. No aortic stenosis is present.   5. The inferior vena cava is normal in size with greater than 50%  respiratory variability, suggesting right atrial pressure of 3 mmHg.    Risk Assessment/Calculations:    Physical Exam:   VS:  There were no vitals taken for this visit.   Wt Readings from Last 3 Encounters:  12/23/23 176 lb 9.6 oz (80.1 kg)  11/24/23 179 lb 9.6 oz (81.5 kg)  11/06/23 180 lb (81.6 kg)    GEN: Well nourished, well developed in no acute distress NECK: No JVD; No carotid bruits CARDIAC: *** RRR, no murmurs, rubs, gallops RESPIRATORY: *** diminshed LLL, otherwise CTA b/l without rales, wheezing or rhonchi  ABDOMEN: Soft, non-tender, non-distended EXTREMITIES: *** No edema; No deformity   ASSESSMENT AND PLAN: .    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

## 2024-02-24 ENCOUNTER — Ambulatory Visit: Attending: Physician Assistant | Admitting: Physician Assistant

## 2024-02-24 NOTE — Progress Notes (Deleted)
  Electrophysiology Office Note:   Date:  02/24/2024  ID:  Herbert FORBES Frost Sr., DOB 18-Apr-1946, MRN [id number]  Primary Cardiologist: Dub Huntsman, DO Electrophysiologist: Will Gladis Norton, MD   Electrophysiologist:  Soyla Gladis Norton, MD  {Click to update primary MD,subspecialty MD or APP then REFRESH:1}    History of Present Illness:   Herbert WORD Sr. is a 78 y.o. male with h/o atrial fibrillation, atrial flutter, NSVT, PVCs, HFrecEF (NICM), diabetes, hx left lower lobectomy as a child seen today for follow up.   Patient referred to EP due to persistent afib on Amiodarone . Afib pulse field ablation completed on 09/11/23. Patient seen in afib clinic on 10/30/23, found to be in atrial flutter. Patient then underwent cardioversion on 11/06/23, converted to sinus bradycardia.   Since last being seen in our clinic the patient reports doing well. Patient had an echocardiogram on 02/02/24 that showed his LVEF had improved to 50-55%.  he denies chest pain, palpitations, dyspnea, PND, orthopnea, nausea, vomiting, dizziness, syncope, edema, weight gain, or early satiety.   Review of systems complete and found to be negative unless listed in HPI.   EP Information / Studies Reviewed:    {EKGtoday:28818}       Arrhythmia/Device History No specialty comments available.   Physical Exam:   VS:  There were no vitals taken for this visit.   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  - Continue Farxiga 10mg .  NSVT PVCs Patient historically managed with beta blocker, though has been off scheduled dosing since the summer due to bradycardia.   {Click here to Review PMH, Prob List, Meds, Allergies, SHx, FHx  :1}   Follow up with {EPMDS:28135::EP Team} {EPFOLLOW UP:28173}  Signed, Artist Pouch, PA-C

## 2024-03-15 NOTE — Progress Notes (Unsigned)
  Electrophysiology Office Note:   Date:  03/15/2024  ID:  Herbert FORBES Frost Sr., DOB 1945/07/04, MRN [id number]  Primary Cardiologist: Dub Huntsman, DO Electrophysiologist: Will Gladis Norton, MD   Electrophysiologist:  Soyla Gladis Norton, MD  {Click to update primary MD,subspecialty MD or APP then REFRESH:1}    History of Present Illness:   Herbert OVERBY Sr. is a 78 y.o. male with h/o atrial fibrillation, atrial flutter, NSVT, PVCs, HFrecEF (NICM), diabetes, hx left lower lobectomy as a child seen today for follow up.   Patient referred to EP due to persistent afib on Amiodarone . Afib pulse field ablation completed on 09/11/23. Patient seen in afib clinic on 10/30/23, found to be in atrial flutter. Patient then underwent cardioversion on 11/06/23, converted to sinus bradycardia.   Since last being seen in our clinic the patient reports doing well. Patient had an echocardiogram on 02/02/24 that showed his LVEF had improved to 50-55%.  he denies chest pain, palpitations, dyspnea, PND, orthopnea, nausea, vomiting, dizziness, syncope, edema, weight gain, or early satiety.   Review of systems complete and found to be negative unless listed in HPI.   EP Information / Studies Reviewed:    {EKGtoday:28818}       Arrhythmia/Device History No specialty comments available.   Physical Exam:   VS:  There were no vitals taken for this visit.   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  - Continue Farxiga 10mg .  NSVT PVCs Patient historically managed with beta blocker, though has been off scheduled dosing since the summer due to bradycardia.   {Click here to Review PMH, Prob List, Meds, Allergies, SHx, FHx  :1}   Follow up with {EPMDS:28135::EP Team} {EPFOLLOW UP:28173}  Signed, Artist Pouch, PA-C

## 2024-03-16 ENCOUNTER — Encounter: Payer: Self-pay | Admitting: Cardiology

## 2024-03-16 ENCOUNTER — Ambulatory Visit: Attending: Physician Assistant | Admitting: Cardiology

## 2024-03-16 VITALS — BP 122/54 | HR 66 | Ht 71.0 in | Wt 181.0 lb

## 2024-03-16 DIAGNOSIS — Z79899 Other long term (current) drug therapy: Secondary | ICD-10-CM

## 2024-03-16 DIAGNOSIS — I480 Paroxysmal atrial fibrillation: Secondary | ICD-10-CM

## 2024-03-16 DIAGNOSIS — D6869 Other thrombophilia: Secondary | ICD-10-CM

## 2024-03-16 DIAGNOSIS — I4510 Unspecified right bundle-branch block: Secondary | ICD-10-CM

## 2024-03-16 DIAGNOSIS — I484 Atypical atrial flutter: Secondary | ICD-10-CM

## 2024-03-16 DIAGNOSIS — I429 Cardiomyopathy, unspecified: Secondary | ICD-10-CM

## 2024-03-16 DIAGNOSIS — I4819 Other persistent atrial fibrillation: Secondary | ICD-10-CM

## 2024-03-16 NOTE — Patient Instructions (Signed)
 Medication Instructions:    STOP TAKING: AMIODARONE     *If you need a refill on your cardiac medications before your next appointment, please call your pharmacy*   Lab Work: NONE ORDERED  TODAY    If you have labs (blood work) drawn today and your tests are completely normal, you will receive your results only by: MyChart Message (if you have MyChart) OR A paper copy in the mail If you have any lab test that is abnormal or we need to change your treatment, we will call you to review the results.  Testing/Procedures:  NONE ORDERED  TODAY    Follow-Up: At [facility], you and your health needs are our priority.  As part of our continuing mission to provide you with exceptional heart care, our providers are all part of one team.  This team includes your primary Cardiologist (physician) and Advanced Practice Providers or APPs (Physician Assistants and Nurse Practitioners) who all work together to provide you with the care you need, when you need it.  Your next appointment:  3 month(s)   Dr Sheena  ( OVERDUE)   We recommend signing up for the patient portal called MyChart.  Sign up information is provided on this After Visit Summary.  MyChart is used to connect with patients for Virtual Visits (Telemedicine).  Patients are able to view lab/test results, encounter notes, upcoming appointments, etc.  Non-urgent messages can be sent to your provider as well.   To learn more about what you can do with MyChart, go to [email or web address].   Other Instructions

## 2024-04-13 ENCOUNTER — Ambulatory Visit: Payer: Self-pay | Admitting: Cardiology

## 2024-04-13 LAB — LAB REPORT - SCANNED
A1c: 8.5
EGFR: 87

## 2024-04-29 NOTE — Telephone Encounter (Signed)
 Results/message from Dr. Emmette Harms has been released to MyChart. A letter is being sent to the last known home address.

## 2024-05-24 ENCOUNTER — Telehealth: Payer: Self-pay | Admitting: Cardiology

## 2024-05-24 NOTE — Telephone Encounter (Signed)
 Patient calling the office for samples of medication:   1.  What medication and dosage are you requesting samples for?   apixaban  (ELIQUIS ) 5 MG TABS tablet    2.  Are you currently out of this medication? Yes   Patient is asking for financial assistance with above medication

## 2024-05-25 ENCOUNTER — Other Ambulatory Visit (HOSPITAL_COMMUNITY): Payer: Self-pay

## 2024-05-25 ENCOUNTER — Telehealth: Payer: Self-pay | Admitting: Pharmacy Technician

## 2024-05-25 NOTE — Telephone Encounter (Signed)
 Attempted to call patient with grant information. [facility] 1/13

## 2024-05-25 NOTE — Telephone Encounter (Signed)
 Team got him a grant, clinical cytogeneticist message was sent. But it doesn't look like patient uses mychart. Can you call patient and let him know?

## 2024-05-25 NOTE — Telephone Encounter (Signed)
 Patient Advocate Encounter   The patient was approved for a [employer] grant that will help cover the cost of Eliquis  Total amount awarded, 7500.00.  Effective: 04/25/24 - 04/24/25   APW:[id number] ERW:[id number] Hmnle:[id number] PI:[id number]  [employer] ID: [id number]   Pharmacy provided with approval and processing information. Patient informed via mychart

## 2024-05-26 ENCOUNTER — Other Ambulatory Visit (HOSPITAL_COMMUNITY): Payer: Self-pay

## 2024-05-26 NOTE — Telephone Encounter (Signed)
 Is saying that the grant is being rejected because dob is wrong. Please advise

## 2024-05-27 NOTE — Telephone Encounter (Signed)
 [employer] said they will put in a workorder and may take up until Monday for it to be fixed. She said you can keep checking the site for the dob fixed

## 2024-05-27 NOTE — Telephone Encounter (Signed)
 Waiting for [employer] to fix DOB

## 2024-05-28 NOTE — Telephone Encounter (Signed)
 Pt calling to f/u on grant. Made his aware of DOB work order.

## 2024-06-03 ENCOUNTER — Emergency Department (HOSPITAL_COMMUNITY)

## 2024-06-03 ENCOUNTER — Inpatient Hospital Stay (HOSPITAL_COMMUNITY)
Admission: EM | Admit: 2024-06-03 | Discharge: 2024-06-09 | DRG: 308 | Disposition: A | Discharge status: Home / Self Care | Attending: Internal Medicine | Admitting: Internal Medicine

## 2024-06-03 ENCOUNTER — Encounter (HOSPITAL_COMMUNITY): Payer: Self-pay | Admitting: Family Medicine

## 2024-06-03 ENCOUNTER — Other Ambulatory Visit: Payer: Self-pay

## 2024-06-03 DIAGNOSIS — E782 Mixed hyperlipidemia: Secondary | ICD-10-CM | POA: Diagnosis present

## 2024-06-03 DIAGNOSIS — J449 Chronic obstructive pulmonary disease, unspecified: Secondary | ICD-10-CM | POA: Diagnosis present

## 2024-06-03 DIAGNOSIS — D649 Anemia, unspecified: Secondary | ICD-10-CM | POA: Diagnosis present

## 2024-06-03 DIAGNOSIS — Z87891 Personal history of nicotine dependence: Secondary | ICD-10-CM | POA: Diagnosis not present

## 2024-06-03 DIAGNOSIS — J9811 Atelectasis: Secondary | ICD-10-CM | POA: Diagnosis present

## 2024-06-03 DIAGNOSIS — I4819 Other persistent atrial fibrillation: Principal | ICD-10-CM | POA: Diagnosis present

## 2024-06-03 DIAGNOSIS — I440 Atrioventricular block, first degree: Secondary | ICD-10-CM | POA: Diagnosis present

## 2024-06-03 DIAGNOSIS — Z720 Tobacco use: Secondary | ICD-10-CM

## 2024-06-03 DIAGNOSIS — Z801 Family history of malignant neoplasm of trachea, bronchus and lung: Secondary | ICD-10-CM

## 2024-06-03 DIAGNOSIS — Z823 Family history of stroke: Secondary | ICD-10-CM

## 2024-06-03 DIAGNOSIS — I472 Ventricular tachycardia, unspecified: Secondary | ICD-10-CM | POA: Diagnosis present

## 2024-06-03 DIAGNOSIS — I4892 Unspecified atrial flutter: Secondary | ICD-10-CM | POA: Diagnosis present

## 2024-06-03 DIAGNOSIS — I4510 Unspecified right bundle-branch block: Secondary | ICD-10-CM | POA: Diagnosis present

## 2024-06-03 DIAGNOSIS — I110 Hypertensive heart disease with heart failure: Secondary | ICD-10-CM | POA: Diagnosis present

## 2024-06-03 DIAGNOSIS — I5043 Acute on chronic combined systolic (congestive) and diastolic (congestive) heart failure: Secondary | ICD-10-CM | POA: Diagnosis present

## 2024-06-03 DIAGNOSIS — I959 Hypotension, unspecified: Secondary | ICD-10-CM | POA: Diagnosis present

## 2024-06-03 DIAGNOSIS — Z79899 Other long term (current) drug therapy: Secondary | ICD-10-CM | POA: Diagnosis not present

## 2024-06-03 DIAGNOSIS — I4719 Other supraventricular tachycardia: Secondary | ICD-10-CM | POA: Diagnosis present

## 2024-06-03 DIAGNOSIS — Z825 Family history of asthma and other chronic lower respiratory diseases: Secondary | ICD-10-CM

## 2024-06-03 DIAGNOSIS — Z7901 Long term (current) use of anticoagulants: Secondary | ICD-10-CM

## 2024-06-03 DIAGNOSIS — I428 Other cardiomyopathies: Secondary | ICD-10-CM | POA: Diagnosis present

## 2024-06-03 DIAGNOSIS — R29810 Facial weakness: Secondary | ICD-10-CM

## 2024-06-03 DIAGNOSIS — I5041 Acute combined systolic (congestive) and diastolic (congestive) heart failure: Secondary | ICD-10-CM

## 2024-06-03 DIAGNOSIS — Z7951 Long term (current) use of inhaled steroids: Secondary | ICD-10-CM

## 2024-06-03 DIAGNOSIS — Z888 Allergy status to other drugs, medicaments and biological substances status: Secondary | ICD-10-CM

## 2024-06-03 DIAGNOSIS — I5033 Acute on chronic diastolic (congestive) heart failure: Secondary | ICD-10-CM | POA: Diagnosis present

## 2024-06-03 DIAGNOSIS — I5020 Unspecified systolic (congestive) heart failure: Secondary | ICD-10-CM

## 2024-06-03 DIAGNOSIS — I493 Ventricular premature depolarization: Secondary | ICD-10-CM | POA: Diagnosis present

## 2024-06-03 DIAGNOSIS — Z7982 Long term (current) use of aspirin: Secondary | ICD-10-CM | POA: Diagnosis not present

## 2024-06-03 DIAGNOSIS — E119 Type 2 diabetes mellitus without complications: Secondary | ICD-10-CM | POA: Diagnosis present

## 2024-06-03 DIAGNOSIS — E118 Type 2 diabetes mellitus with unspecified complications: Secondary | ICD-10-CM | POA: Diagnosis not present

## 2024-06-03 DIAGNOSIS — Z85038 Personal history of other malignant neoplasm of large intestine: Secondary | ICD-10-CM

## 2024-06-03 DIAGNOSIS — Z7984 Long term (current) use of oral hypoglycemic drugs: Secondary | ICD-10-CM

## 2024-06-03 DIAGNOSIS — F0390 Unspecified dementia without behavioral disturbance: Secondary | ICD-10-CM | POA: Diagnosis present

## 2024-06-03 DIAGNOSIS — I4891 Unspecified atrial fibrillation: Secondary | ICD-10-CM | POA: Diagnosis not present

## 2024-06-03 DIAGNOSIS — Z902 Acquired absence of lung [part of]: Secondary | ICD-10-CM

## 2024-06-03 DIAGNOSIS — Z1152 Encounter for screening for COVID-19: Secondary | ICD-10-CM | POA: Diagnosis not present

## 2024-06-03 DIAGNOSIS — Z9049 Acquired absence of other specified parts of digestive tract: Secondary | ICD-10-CM

## 2024-06-03 DIAGNOSIS — D696 Thrombocytopenia, unspecified: Secondary | ICD-10-CM | POA: Diagnosis present

## 2024-06-03 DIAGNOSIS — Z8249 Family history of ischemic heart disease and other diseases of the circulatory system: Secondary | ICD-10-CM | POA: Diagnosis not present

## 2024-06-03 DIAGNOSIS — I480 Paroxysmal atrial fibrillation: Secondary | ICD-10-CM | POA: Diagnosis present

## 2024-06-03 LAB — TROPONIN T, HIGH SENSITIVITY
Troponin T High Sensitivity: 25 ng/L — ABNORMAL HIGH (ref 0–19)
Troponin T High Sensitivity: 22 ng/L — ABNORMAL HIGH (ref 0–19)

## 2024-06-03 LAB — CBC WITH DIFFERENTIAL/PLATELET
Abs Immature Granulocytes: 0.01 K/uL (ref 0.00–0.07)
Basophils Absolute: 0 K/uL (ref 0.0–0.1)
Basophils Relative: 1 %
Eosinophils Absolute: 0.1 K/uL (ref 0.0–0.5)
Eosinophils Relative: 1 %
HCT: 34.6 % — ABNORMAL LOW (ref 39.0–52.0)
Hemoglobin: 11.2 g/dL — ABNORMAL LOW (ref 13.0–17.0)
Immature Granulocytes: 0 %
Lymphocytes Relative: 16 %
Lymphs Abs: 0.9 K/uL (ref 0.7–4.0)
MCH: 28.7 pg (ref 26.0–34.0)
MCHC: 32.4 g/dL (ref 30.0–36.0)
MCV: 88.7 fL (ref 80.0–100.0)
Monocytes Absolute: 0.5 K/uL (ref 0.1–1.0)
Monocytes Relative: 9 %
Neutro Abs: 3.9 K/uL (ref 1.7–7.7)
Neutrophils Relative %: 73 %
Platelets: 146 K/uL — ABNORMAL LOW (ref 150–400)
RBC: 3.9 MIL/uL — ABNORMAL LOW (ref 4.22–5.81)
RDW: 14.2 % (ref 11.5–15.5)
WBC: 5.4 K/uL (ref 4.0–10.5)
nRBC: 0 % (ref 0.0–0.2)

## 2024-06-03 LAB — URINALYSIS, ROUTINE W REFLEX MICROSCOPIC
Bacteria, UA: NONE SEEN
Bilirubin Urine: NEGATIVE
Glucose, UA: >=500 mg/dL — AB
Hgb urine dipstick: NEGATIVE
Ketones, ur: NEGATIVE mg/dL
Leukocytes,Ua: NEGATIVE
Nitrite: NEGATIVE
Protein, ur: NEGATIVE mg/dL
Specific Gravity, Urine: 1.017 (ref 1.005–1.030)
pH: 5 (ref 5.0–8.0)

## 2024-06-03 LAB — COMPREHENSIVE METABOLIC PANEL WITH GFR
ALT: 19 U/L (ref 0–44)
AST: 18 U/L (ref 15–41)
Albumin: 3.7 g/dL (ref 3.5–5.0)
Alkaline Phosphatase: 80 U/L (ref 38–126)
Anion gap: 12 (ref 5–15)
BUN: 10 mg/dL (ref 8–23)
CO2: 24 mmol/L (ref 22–32)
Calcium: 8.5 mg/dL — ABNORMAL LOW (ref 8.9–10.3)
Chloride: 103 mmol/L (ref 98–111)
Creatinine, Ser: 0.89 mg/dL (ref 0.61–1.24)
GFR, Estimated: >60 mL/min
Glucose, Bld: 121 mg/dL — ABNORMAL HIGH (ref 70–99)
Potassium: 4 mmol/L (ref 3.5–5.1)
Sodium: 139 mmol/L (ref 135–145)
Total Bilirubin: 0.5 mg/dL (ref 0.0–1.2)
Total Protein: 6.3 g/dL — ABNORMAL LOW (ref 6.5–8.1)

## 2024-06-03 LAB — PRO BRAIN NATRIURETIC PEPTIDE: Pro Brain Natriuretic Peptide: 3487 pg/mL — ABNORMAL HIGH

## 2024-06-03 LAB — RESP PANEL BY RT-PCR (RSV, FLU A&B, COVID)  RVPGX2
Influenza A by PCR: NEGATIVE
Influenza B by PCR: NEGATIVE
Resp Syncytial Virus by PCR: NEGATIVE
SARS Coronavirus 2 by RT PCR: NEGATIVE

## 2024-06-03 LAB — CBG MONITORING, ED: Glucose-Capillary: 135 mg/dL — ABNORMAL HIGH (ref 70–99)

## 2024-06-03 LAB — MAGNESIUM: Magnesium: 1.8 mg/dL (ref 1.7–2.4)

## 2024-06-03 LAB — POC OCCULT BLOOD, ED: Fecal Occult Bld: NEGATIVE

## 2024-06-03 MED ORDER — IPRATROPIUM-ALBUTEROL 0.5-2.5 (3) MG/3ML IN SOLN
3.0000 mL | Freq: Four times a day (QID) | RESPIRATORY_TRACT | Status: DC | PRN
  Administered 2024-06-03 – 2024-06-05 (×2): 3 mL via RESPIRATORY_TRACT
  Filled 2024-06-03 (×2): qty 3

## 2024-06-03 MED ORDER — SODIUM CHLORIDE 0.9% FLUSH
3.0000 mL | Freq: Two times a day (BID) | INTRAVENOUS | Status: AC
  Administered 2024-06-04 – 2024-06-09 (×10): 3 mL via INTRAVENOUS

## 2024-06-03 MED ORDER — SODIUM CHLORIDE 0.9 % IV BOLUS
1000.0000 mL | Freq: Once | INTRAVENOUS | Status: AC
  Administered 2024-06-03: 1000 mL via INTRAVENOUS

## 2024-06-03 MED ORDER — INSULIN ASPART 100 UNIT/ML IJ SOLN: 0.0000 [IU] | Freq: Every day | INTRAMUSCULAR | Status: AC

## 2024-06-03 MED ORDER — DONEPEZIL HCL 10 MG PO TABS
10.0000 mg | ORAL_TABLET | Freq: Every day | ORAL | Status: DC
  Administered 2024-06-04 – 2024-06-08 (×5): 10 mg via ORAL
  Filled 2024-06-03 (×5): qty 1

## 2024-06-03 MED ORDER — METOPROLOL TARTRATE 25 MG PO TABS
25.0000 mg | ORAL_TABLET | Freq: Two times a day (BID) | ORAL | Status: DC
  Administered 2024-06-03 – 2024-06-04 (×2): 25 mg via ORAL
  Filled 2024-06-03 (×2): qty 1

## 2024-06-03 MED ORDER — LORAZEPAM 1 MG PO TABS
1.0000 mg | ORAL_TABLET | Freq: Once | ORAL | Status: AC
  Administered 2024-06-03: 1 mg via ORAL
  Filled 2024-06-03: qty 1

## 2024-06-03 MED ORDER — GABAPENTIN 300 MG PO CAPS
300.0000 mg | ORAL_CAPSULE | Freq: Every day | ORAL | Status: AC
  Administered 2024-06-04 – 2024-06-08 (×5): 300 mg via ORAL
  Filled 2024-06-03 (×5): qty 1

## 2024-06-03 MED ORDER — APIXABAN 5 MG PO TABS
5.0000 mg | ORAL_TABLET | Freq: Two times a day (BID) | ORAL | Status: AC
  Administered 2024-06-03 – 2024-06-09 (×12): 5 mg via ORAL
  Filled 2024-06-03 (×13): qty 1

## 2024-06-03 MED ORDER — POLYVINYL ALCOHOL 1.4 % OP SOLN: 2.0000 [drp] | OPHTHALMIC | Status: DC | PRN

## 2024-06-03 MED ORDER — AMIODARONE LOAD VIA INFUSION
150.0000 mg | Freq: Once | INTRAVENOUS | Status: DC
  Filled 2024-06-03: qty 83.34

## 2024-06-03 MED ORDER — FUROSEMIDE 10 MG/ML IJ SOLN
40.0000 mg | Freq: Once | INTRAMUSCULAR | Status: AC
  Administered 2024-06-03: 40 mg via INTRAVENOUS
  Filled 2024-06-03: qty 4

## 2024-06-03 MED ORDER — IOHEXOL 350 MG/ML SOLN
75.0000 mL | Freq: Once | INTRAVENOUS | Status: AC | PRN
  Administered 2024-06-03: 75 mL via INTRAVENOUS

## 2024-06-03 MED ORDER — DONEPEZIL HCL 10 MG PO TABS: 10.0000 mg | ORAL_TABLET | Freq: Every day | ORAL | Status: DC

## 2024-06-03 MED ORDER — TRAZODONE HCL 50 MG PO TABS
150.0000 mg | ORAL_TABLET | Freq: Every day | ORAL | Status: AC
  Administered 2024-06-03 – 2024-06-08 (×6): 150 mg via ORAL
  Filled 2024-06-03 (×3): qty 1
  Filled 2024-06-03: qty 3
  Filled 2024-06-03 (×3): qty 1

## 2024-06-03 MED ORDER — INSULIN ASPART 100 UNIT/ML IJ SOLN
0.0000 [IU] | Freq: Three times a day (TID) | INTRAMUSCULAR | Status: AC
  Administered 2024-06-04 (×2): 2 [IU] via SUBCUTANEOUS
  Administered 2024-06-05: 3 [IU] via SUBCUTANEOUS
  Administered 2024-06-05: 2 [IU] via SUBCUTANEOUS
  Administered 2024-06-06: 1 [IU] via SUBCUTANEOUS
  Administered 2024-06-06 – 2024-06-07 (×2): 2 [IU] via SUBCUTANEOUS
  Administered 2024-06-08: 3 [IU] via SUBCUTANEOUS
  Administered 2024-06-08: 1 [IU] via SUBCUTANEOUS
  Administered 2024-06-08: 2 [IU] via SUBCUTANEOUS
  Filled 2024-06-03: qty 3
  Filled 2024-06-03: qty 2
  Filled 2024-06-03 (×2): qty 6
  Filled 2024-06-03: qty 2
  Filled 2024-06-03: qty 1
  Filled 2024-06-03 (×2): qty 2
  Filled 2024-06-03: qty 3
  Filled 2024-06-03: qty 2

## 2024-06-03 MED ORDER — DILTIAZEM HCL-DEXTROSE 125-5 MG/125ML-% IV SOLN (PREMIX)
5.0000 mg/h | INTRAVENOUS | Status: DC
  Administered 2024-06-03: 5 mg/h via INTRAVENOUS
  Filled 2024-06-03: qty 125

## 2024-06-03 MED ORDER — AMIODARONE HCL IN DEXTROSE 360-4.14 MG/200ML-% IV SOLN: 30.0000 mg/h | INTRAVENOUS | Status: DC

## 2024-06-03 MED ORDER — MEMANTINE HCL 10 MG PO TABS
10.0000 mg | ORAL_TABLET | Freq: Two times a day (BID) | ORAL | Status: AC
  Administered 2024-06-03 – 2024-06-09 (×12): 10 mg via ORAL
  Filled 2024-06-03 (×13): qty 1

## 2024-06-03 MED ORDER — SENNOSIDES-DOCUSATE SODIUM 8.6-50 MG PO TABS: 1.0000 | ORAL_TABLET | Freq: Every evening | ORAL | Status: AC | PRN

## 2024-06-03 MED ORDER — AMIODARONE HCL IN DEXTROSE 360-4.14 MG/200ML-% IV SOLN: 60.0000 mg/h | INTRAVENOUS | Status: DC

## 2024-06-03 MED ORDER — ACETAMINOPHEN 650 MG RE SUPP: 650.0000 mg | Freq: Four times a day (QID) | RECTAL | Status: DC | PRN

## 2024-06-03 MED ORDER — MAGNESIUM SULFATE 2 GM/50ML IV SOLN
2.0000 g | Freq: Once | INTRAVENOUS | Status: AC
  Administered 2024-06-03: 2 g via INTRAVENOUS
  Filled 2024-06-03: qty 50

## 2024-06-03 MED ORDER — DAPAGLIFLOZIN PROPANEDIOL 10 MG PO TABS
10.0000 mg | ORAL_TABLET | Freq: Every day | ORAL | Status: AC
  Administered 2024-06-04 – 2024-06-09 (×6): 10 mg via ORAL
  Filled 2024-06-03 (×7): qty 1

## 2024-06-03 MED ORDER — FUROSEMIDE 10 MG/ML IJ SOLN: 40.0000 mg | Freq: Two times a day (BID) | INTRAMUSCULAR | Status: DC

## 2024-06-03 MED ORDER — ACETAMINOPHEN 325 MG PO TABS: 650.0000 mg | ORAL_TABLET | Freq: Four times a day (QID) | ORAL | Status: AC | PRN

## 2024-06-03 NOTE — ED Notes (Signed)
 Pts HR back into AFIB. Admitting MD made aware and stated to monitor if HR is sustained. Pts HR between 80-95 bpm. Admitting MD advised to restart Cardizem  if HR becomes >120 bpm.

## 2024-06-03 NOTE — H&P (Addendum)
 " History and Physical    Herbert Evans FMW:[id number] DOB: March 05, 1946 DOA: 06/03/2024  PCP: Silvano Angeline FALCON, NP   Patient coming from: Home   Chief Complaint: Palpitations, off balance, left eye drooping   HPI: Herbert Evans Sr. is a 79 y.o. male with medical history significant for type 2 diabetes mellitus, atrial fibrillation and flutter status post ablation and DCCV, heart failure with improved EF, memory loss, and chronic cough who presents with palpitations, disequilibrium, and possible left facial weakness.  Patient reports that he felt fine last night but was having palpitations this morning.  Family notes that he appeared to be off balance while ambulating and they also were concerned that there seemed to be some drooping around his left eye.  Patient reports some mild discomfort and clear drainage from the left eye but no pain or vision change.  He denies focal numbness or weakness.  He denies sputum production, fever, or chills.  ED Course: Upon arrival to the ED, patient is found to be afebrile and saturating well on room air with tachypnea, tachycardia, and stable blood pressure.  EKG demonstrates a tachyarrhythmia with rate 128, RBBB, and LPFB.  CTA chest is negative for PE. There are no acute findings on MRI brain.  Patient was given a liter of saline in the ED.  He was evaluated by cardiology and given IV Lasix , oral Lopressor , and IV diltiazem .  Review of Systems:  All other systems reviewed and apart from HPI, are negative.  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    DOE (dyspnea on exertion)    History of colon cancer    03-01-2005   s/p  sigmoid colectomy w/ negative nodes  (07-27-2019  per pt last colonoscopy 2019 , no recurrence)   History of Helicobacter pylori infection    09/ 2006   History of kidney stones    History of  pneumonectomy    1967  left lower lobe - severe PNA age 6   Hypertension    Mixed hyperlipidemia    NSVT (nonsustained ventricular tachycardia) (HCC)    Paroxysmal ventricular tachycardia (HCC)    PAT (paroxysmal atrial tachycardia)    PNA (pneumonia) 10/13/2020   Pneumonia    PVC's (premature ventricular contractions)    RBBB (right bundle branch block)    Right ureteral stone    Type 2 diabetes mellitus (HCC)    followed by pcp --- (07-27-2019  checks blood sugar three times per week in am,  fasting sugar-- 125-160)   Wears dentures    upper    Past Surgical History:  Procedure Laterality Date   ATRIAL FIBRILLATION ABLATION N/A 09/11/2023   Procedure: ATRIAL FIBRILLATION ABLATION;  Surgeon: Inocencio Soyla Lunger, MD;  Location: [facility];  Service: Cardiovascular;  Laterality: N/A;   CARDIAC CATHETERIZATION  02-09-2002   dr levern  @[facility]    ef 50-55% w/ 10-15% LAD   CARDIAC CATHETERIZATION  06-28-2016   dr launie @[facility]    for arrhythmia)--- normal coronaries, mild focal wall motion abnormallity of distal inferapical wall,  severe right innominate / subclavin tortuosity   CARDIOVERSION N/A 04/22/2022   Procedure: CARDIOVERSION;  Surgeon: Loni Soyla LABOR, MD;  Location: [facility] ENDOSCOPY;  Service: Cardiovascular;  Laterality: N/A;   CARDIOVERSION N/A 11/06/2023   Procedure: CARDIOVERSION;  Surgeon: Michele Richardson, DO;  Location: [facility];  Service: Cardiovascular;  Laterality: N/A;   CATARACT EXTRACTION W/ INTRAOCULAR LENS  IMPLANT, BILATERAL  2018   COLON RESECTION SIGMOID  03-01-2005   @[facility]    CYSTOSCOPY WITH RETROGRADE PYELOGRAM, URETEROSCOPY AND STENT PLACEMENT Right 08/03/2019   Procedure: CYSTOSCOPY WITH RIGHT  RETROGRADE PYELOGRAM, URETEROSCOPY HOLMIUMM LASER AND STENT PLACEMENT;  Surgeon: Watt Rush, MD;  Location: [facility];  Service: Urology;  Laterality: Right;   CYSTOSCOPY/RETROGRADE/URETEROSCOPY/STONE EXTRACTION WITH BASKET  1970s   IR KYPHO  LUMBAR INC FX REDUCE BONE BX UNI/BIL CANNULATION INC/IMAGING  06/05/2023   LUNG REMOVAL, PARTIAL Left 1967   lower lobe for pneumonia per pt   TONSILLECTOMY AND ADENOIDECTOMY  child    Social History:   reports that he has quit smoking. His smoking use included cigars. His smokeless tobacco use includes snuff and chew. He reports that he does not drink alcohol  and does not use drugs.  Allergies[1]  Family History  Problem Relation Age of Onset   Asthma Mother    Heart attack Mother    Stroke Father    Lung cancer Sister    Lung cancer Sister    Lung cancer Sister      Prior to Admission medications  Medication Sig Start Date End Date Taking? Authorizing Provider  albuterol  (ACCUNEB ) 0.63 MG/3ML nebulizer solution Take 1 ampule by nebulization every 6 (six) hours as needed for wheezing or shortness of breath.   Yes [provider]  apixaban  (ELIQUIS ) 5 MG TABS tablet Take 1 tablet (5 mg total) by mouth 2 (two) times daily. 12/05/23  Yes Tobb, Kardie, DO  aspirin  EC 81 MG tablet Take 81 mg by mouth every morning.   Yes [provider]  Budeson-Glycopyrrol-Formoterol  (BREZTRI AEROSPHERE) 160-9-4.8 MCG/ACT AERO Inhale 2 puffs into the lungs daily.   Yes [provider]  donepezil  (ARICEPT ) 10 MG tablet Take 10 mg by mouth at bedtime. 04/08/22  Yes [provider]  escitalopram (LEXAPRO) 20 MG tablet Take 20 mg by mouth daily. 06/01/24  Yes [provider]  ezetimibe  (ZETIA ) 10 MG tablet Take 1 tablet (10 mg total) by mouth daily. 02/19/24  Yes Tobb, Kardie, DO  FARXIGA  10 MG TABS tablet Take 10 mg by mouth daily. 04/02/21  Yes [provider]  fluticasone  (FLONASE) 50 MCG/ACT nasal spray Place 2 sprays into both nostrils daily as needed for allergies or rhinitis.   Yes [provider]  gabapentin  (NEURONTIN ) 300 MG capsule Take 300 mg by mouth at bedtime. 05/09/23  Yes [provider]  lidocaine  (LIDODERM ) 5 % Place 1  patch onto the skin daily. Remove & Discard patch within 12 hours or as directed by MD Patient taking differently: Place 1 patch onto the skin daily as needed (pain). Remove & Discard patch within 12 hours or as directed by MD 04/19/23  Yes Melvenia Motto, MD  memantine  (NAMENDA ) 10 MG tablet Take 10 mg by mouth 2 (two) times daily. 11/02/19  Yes [provider]  metFORMIN (GLUCOPHAGE) 1000 MG tablet Take 1,000 mg by mouth 2 (two) times daily with a meal.    Yes [provider]  traZODone  (DESYREL ) 100 MG tablet Take 150 mg by mouth at bedtime. 08/02/19  Yes [provider]  TRESIBA FLEXTOUCH 200 UNIT/ML FlexTouch Pen Inject 12 Units into the skin at bedtime. 05/09/23  Yes [provider]  NON FORMULARY CPAP at bedtime.    [provider]  Physical Exam: Vitals:   06/03/24 1930 06/03/24 1945 06/03/24 2007 06/03/24 2030  BP: (!) 138/98 133/76 121/86 122/82  Pulse: (!) 131 (!) 125 (!) 127   Resp: (!) 26 (!) 22 (!) 27 (!) 29  Temp:      TempSrc:      SpO2: 98% 95% 98% 100%  Weight:      Height:         Constitutional: NAD, no pallor or diaphoresis   Eyes: PERTLA, lids and conjunctivae normal ENMT: Mucous membranes are moist. Posterior pharynx clear of any exudate or lesions.   Neck: supple, no masses  Respiratory: no wheezing, no crackles. No accessory muscle use.  Cardiovascular: S1 & S2 heard, regular rate and rhythm. Pretibial pitting edema b/l.  Abdomen: No tenderness, soft. Bowel sounds active.  Musculoskeletal: no clubbing / cyanosis. No joint deformity upper and lower extremities.   Skin: no significant rashes, lesions, ulcers. Warm, dry, well-perfused. Neurologic: CN 2-12 grossly intact. Sensation to light touch intact. Strength 5/5 in all 4 limbs. Alert and oriented to person, place, and situation.  Psychiatric: Calm. Cooperative.    Labs and Imaging on Admission: I have personally reviewed following labs and imaging  studies  CBC: Recent Labs  Lab 06/03/24 1404  WBC 5.4  NEUTROABS 3.9  HGB 11.2*  HCT 34.6*  MCV 88.7  PLT 146*   Basic Metabolic Panel: Recent Labs  Lab 06/03/24 1404  NA 139  K 4.0  CL 103  CO2 24  GLUCOSE 121*  BUN 10  CREATININE 0.89  CALCIUM  8.5*  MG 1.8   GFR: Estimated Creatinine Clearance: 75.2 mL/min (by C-G formula based on SCr of 0.89 mg/dL). Liver Function Tests: Recent Labs  Lab 06/03/24 1404  AST 18  ALT 19  ALKPHOS 80  BILITOT 0.5  PROT 6.3*  ALBUMIN 3.7   No results for input(s): LIPASE, AMYLASE in the last 168 hours. No results for input(s): AMMONIA in the last 168 hours. Coagulation Profile: No results for input(s): INR, PROTIME in the last 168 hours. Cardiac Enzymes: No results for input(s): CKTOTAL, CKMB, CKMBINDEX, TROPONINI in the last 168 hours. BNP (last 3 results) Recent Labs    06/03/24 1526  PROBNP 3,487.0*   HbA1C: No results for input(s): HGBA1C in the last 72 hours. CBG: No results for input(s): GLUCAP in the last 168 hours. Lipid Profile: No results for input(s): CHOL, HDL, LDLCALC, TRIG, CHOLHDL, LDLDIRECT in the last 72 hours. Thyroid  Function Tests: No results for input(s): TSH, T4TOTAL, FREET4, T3FREE, THYROIDAB in the last 72 hours. Anemia Panel: No results for input(s): VITAMINB12, FOLATE, FERRITIN, TIBC, IRON, RETICCTPCT in the last 72 hours. Urine analysis:    Component Value Date/Time   COLORURINE AMBER (A) 06/03/2024 1542   APPEARANCEUR CLEAR 06/03/2024 1542   LABSPEC 1.017 06/03/2024 1542   PHURINE 5.0 06/03/2024 1542   GLUCOSEU >=500 (A) 06/03/2024 1542   HGBUR NEGATIVE 06/03/2024 1542   BILIRUBINUR NEGATIVE 06/03/2024 1542   KETONESUR NEGATIVE 06/03/2024 1542   PROTEINUR NEGATIVE 06/03/2024 1542   NITRITE NEGATIVE 06/03/2024 1542   LEUKOCYTESUR NEGATIVE 06/03/2024 1542   Sepsis Labs: @LABRCNTIP (procalcitonin:4,lacticidven:4) ) Recent  Results (from the past 240 hours)  Resp panel by RT-PCR (RSV, Flu A&B, Covid) Anterior Nasal Swab     Status: None   Collection Time: 06/03/24  2:33 PM   Specimen: Anterior Nasal Swab  Result Value Ref Range Status   SARS Coronavirus 2 by RT PCR NEGATIVE NEGATIVE Final   Influenza A by  PCR NEGATIVE NEGATIVE Final   Influenza B by PCR NEGATIVE NEGATIVE Final    Comment: (NOTE) The Xpert Xpress SARS-CoV-2/FLU/RSV plus assay is intended as an aid in the diagnosis of influenza from Nasopharyngeal swab specimens and should not be used as a sole basis for treatment. Nasal washings and aspirates are unacceptable for Xpert Xpress SARS-CoV-2/FLU/RSV testing.  Fact Sheet for Patients: bloggercourse.com  Fact Sheet for Healthcare Providers: seriousbroker.it  This test is not yet approved or cleared by the [place]  FDA and has been authorized for detection and/or diagnosis of SARS-CoV-2 by FDA under an Emergency Use Authorization (EUA). This EUA will remain in effect (meaning this test can be used) for the duration of the COVID-19 declaration under Section 564(b)(1) of the Act, 21 U.S.C. section 360bbb-3(b)(1), unless the authorization is terminated or revoked.     Resp Syncytial Virus by PCR NEGATIVE NEGATIVE Final    Comment: (NOTE) Fact Sheet for Patients: bloggercourse.com  Fact Sheet for Healthcare Providers: seriousbroker.it  This test is not yet approved or cleared by the [place]  FDA and has been authorized for detection and/or diagnosis of SARS-CoV-2 by FDA under an Emergency Use Authorization (EUA). This EUA will remain in effect (meaning this test can be used) for the duration of the COVID-19 declaration under Section 564(b)(1) of the Act, 21 U.S.C. section 360bbb-3(b)(1), unless the authorization is terminated or revoked.  Performed at [facility], [address], [place], [place] [address]      Radiological Exams on Admission: MR BRAIN WO CONTRAST Result Date: 06/03/2024 EXAM: MRI BRAIN WITHOUT CONTRAST 06/03/2024 06:58:07 PM TECHNIQUE: Multiplanar multisequence MRI of the head/brain was performed without the administration of intravenous contrast. COMPARISON: Same day CT head and MRI head 11/11/2018. CLINICAL HISTORY: Transient ischemic attack (TIA). FINDINGS: BRAIN AND VENTRICLES: No acute infarct. No intracranial hemorrhage. No mass. No midline shift. No hydrocephalus. The sella is unremarkable. Normal flow voids. There are a few scattered areas of T2 and FLAIR hyperintensity in the periventricular and subcortical white matter compatible with chronic microvascular ischemic changes. There is redemonstration of a focal lesion within the left parietal calvarium near midline. The lesion demonstrates T2 and FLAIR hyperintensity with T1 hypointensity. There is asymmetric involvement of the inner table of the calvarium. There is thinning of the outer table which is better appreciated on same day CT. The apparent soft tissue seen on CT appears unrelated to the lesion. CSF partially fills the defect along the inner table created by the lesion. There is no involvement of brain parenchyma. The lesion is noted adjacent to the superior sagittal sinus and may reflect a prominent arachnoid granulation. This focus is unchanged since at least 2020 and is favored benign. ORBITS: Bilateral lens replacement. SINUSES AND MASTOIDS: Mucosal thickening in the ethmoid and maxillary sinuses. BONES AND SOFT TISSUES: Normal marrow signal. No soft tissue abnormality. IMPRESSION: 1. Left parietal calvarium lesion is favored to reflect a prominent arachnoid granulation versus other benign etiology. Finding is unchanged since 2020. 2. No acute intracranial abnormality. Electronically signed by: Donnice Mania MD 06/03/2024 07:30 PM EST RP Workstation: [id number]   CT Angio Chest PE W and/or  Wo Contrast Result Date: 06/03/2024 CLINICAL DATA:  Concern for pulmonary embolus EXAM: CT ANGIOGRAPHY CHEST WITH CONTRAST TECHNIQUE: Multidetector CT imaging of the chest was performed using the standard protocol during bolus administration of intravenous contrast. Multiplanar CT image reconstructions and MIPs were obtained to evaluate the vascular anatomy. RADIATION DOSE REDUCTION: This exam was performed according  to the departmental dose-optimization program which includes automated exposure control, adjustment of the mA and/or kV according to patient size and/or use of iterative reconstruction technique. CONTRAST:  75mL OMNIPAQUE  IOHEXOL  350 MG/ML SOLN COMPARISON:  Chest CT dated 06/03/2024. FINDINGS: Cardiovascular: Mild cardiomegaly. No pericardial effusion. There is coronary vascular calcification. There is mild atherosclerotic calcification of the thoracic aorta. No aneurysmal dilatation. Mildly dilated main pulmonary trunk suggestive of pulmonary hypertension. Evaluation of the pulmonary arteries is limited due to respiratory motion and streak artifact caused by patient's arms as well as due to suboptimal opacification of the distal branches and timing of the contrast. No central pulmonary artery embolus identified. Mediastinum/Nodes: No hilar adenopathy. Evaluation of the left hilum is limited due to consolidative changes of the left lung. A cluster of lymph node in the subcarinal region measures up to 2 cm in diameter. The esophagus is grossly unremarkable no mediastinal fluid collection. Lungs/Pleura: Postsurgical changes of the left lower lobectomy and left lung base scarring. There is mucus secretion or aspirated content in the left upper lobe bronchus. Bilateral bronchial wall thickening and peribronchial edema. Patchy area of ground-glass and reticulonodular density in the posterior left upper lobe as well as additional scattered faint ground-glass areas in the right upper lobe may represent  bronchopneumonia. There is a small right pleural effusion with partial compressive atelectasis of the right lung base. No pneumothorax. Upper Abdomen: No acute abnormality. Musculoskeletal: Osteopenia with degenerative changes of the spine. No acute osseous pathology. Review of the MIP images confirms the above findings. IMPRESSION: 1. No CT evidence of central pulmonary artery embolus. 2. Postsurgical changes of the left lower lobectomy and left lung base scarring. 3. Mucus secretion or aspirated content in the left upper lobe bronchus. 4. Patchy area of ground-glass and reticulonodular density in the posterior left upper lobe as well as additional scattered faint ground-glass areas in the right upper lobe may represent bronchopneumonia. 5. Small right pleural effusion with partial compressive atelectasis of the right lung base. 6.  Aortic Atherosclerosis (ICD10-I70.0). Electronically Signed   By: Vanetta Chou M.D.   On: 06/03/2024 16:47   CT Head Wo Contrast Result Date: 06/03/2024 EXAM: CT HEAD WITHOUT CONTRAST 06/03/2024 04:30:43 PM TECHNIQUE: CT of the head was performed without the administration of intravenous contrast. Automated exposure control, iterative reconstruction, and/or weight based adjustment of the mA/kV was utilized to reduce the radiation dose to as low as reasonably achievable. COMPARISON: 04/19/2023 CLINICAL HISTORY: Mental status change, unknown cause. FINDINGS: BRAIN AND VENTRICLES: No acute hemorrhage. No evidence of acute infarct. No hydrocephalus. No extra-axial collection. No mass effect or midline shift. Atherosclerosis of the Carotid Siphons and Intracranial Vertebral Arteries. ORBITS: Bilateral lens replacement. SINUSES: Mucosal thickening throughout the paranasal sinuses most pronounced in the maxillary sinuses. SOFT TISSUES AND SKULL: There is a 1.2 x 1.0 x 2.2 cm irregular lytic lesion in the parietal calvarium left of midline primarily involving the inner table and diploic  space with significant thinning of the outer table (series 4, image 25; series 8, image 33). There are likely areas of cortical breakthrough along the outer table. Focal soft tissue thickening in the scalp overlying the lesion (series 8, image 31). This is overall unchanged compared to the 2024 study. Sequelae of left mastoidectomy. IMPRESSION: 1. No acute intracranial abnormality. 2. 1.2 x 1.0 x 2.2 cm irregular lytic lesion in the parietal calvarium left of midline with possible overlying soft tissue thickening. Findings unchanged compared to the 2024 study. Consider nonemergent MRI head  with and without contrast for further characterization. Electronically signed by: Donnice Mania MD 06/03/2024 04:39 PM EST RP Workstation: [id number]   DG Chest Portable 1 View Result Date: 06/03/2024 CLINICAL DATA:  Chest tightness. EXAM: PORTABLE CHEST 1 VIEW COMPARISON:  Chest radiograph dated 08/13/2023. FINDINGS: There is cardiomegaly with vascular congestion. Elevation of the left hemidiaphragm with left lung base chronic consolidation which may represent scarring. A small left pleural effusion and left lung base infiltrate is not excluded. Follow-up recommended. Left apical subpleural scarring. No pneumothorax. No acute osseous pathology. IMPRESSION: 1. Cardiomegaly with vascular congestion. 2. Chronic elevation of the left hemidiaphragm with left lung base scarring. A small left pleural effusion and left lung base infiltrate is not excluded. Electronically Signed   By: Vanetta Chou M.D.   On: 06/03/2024 13:46    EKG: Independently reviewed. Atrial flutter, rate 128.   Assessment/Plan   1. Rapid atrial flutter   - Appreciate cardiology consultation, TEE DCCV is being considered this admission, oral Lopressor  and IV diltiazem  started in ED, will continue Eliquis    Addendum (22:05): He converted to SR with rate in the 60s and IV diltiazem  was stopped.    2. 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 Type II DM  - Continue Farxiga , check CBGs, use low-intensity SSI if needed  4. Disequilibrium   - Patient was having difficulty ambulating today, appeared off balance per family who were also concerned for left facial weakness  - No acute findings noted on MRI brain and no focal deficits identified on exam  - Consult PT, use fall precautions    5. Memory loss  - Continue Namenda  and Aricept , use delirium precautions     DVT prophylaxis: Eliquis   Code Status: Full  Level of Care: Level of care: Progressive Family Communication: wife and son at bedside   Disposition Plan:  Patient is from: Home  Anticipated d/c is to: Home  Anticipated d/c date is: 06/08/24 Patient currently: Pending rate-control, improved volume status  Consults called: Cardiology  Admission status: Inpatient     Evalene GORMAN Sprinkles, MD [facility]  06/03/2024, 9:11 PM       [1]  Allergies Allergen Reactions   Crestor [Rosuvastatin] Other (See Comments)    myalgias   Lipitor [Atorvastatin] Other (See Comments)    myalgias   "

## 2024-06-03 NOTE — ED Notes (Signed)
 Pt states his eyes feel blurry and endorses drainage from L eye

## 2024-06-03 NOTE — ED Notes (Signed)
 Patient transported to MRI

## 2024-06-03 NOTE — ED Triage Notes (Signed)
 Pt BIB [facility] for chest tightness and felt like he was in a fib. Woke up with it the morning, nothing makes it better or worse. Hx of a fib and A fib RVR, two cardioversion and an ablation in the past. On Elliquis, no recent falls.   VS 122/78, hr 128, 96% RA, cbg 213, 98.2 F  20 R forearm, 600 mL saline

## 2024-06-03 NOTE — ED Provider Notes (Signed)
" °  Physical Exam  BP (!) 138/98   Pulse (!) 131   Temp 98.3 F (36.8 C) (Oral)   Resp (!) 26   Ht 5' 11 (1.803 m)   Wt 82.6 kg   SpO2 98%   BMI 25.38 kg/m   Physical Exam  Procedures  Procedures  ED Course / MDM   Clinical Course as of 06/03/24 1946  Thu Jun 03, 2024  1432 Patient remains with generalized tremors.  His CBC shows no leukocytosis.  Hemoglobin is 11.2 which is downtrending.  He denies any melanotic stools, bright red blood per rectum, or hematemesis.  His baseline appears to be between 13-15. [AA]  1456 Hemoccult performed. [AA]  1457 Patient still with tremors and still tachycardic.  His troponin is elevated at 25.  He does have some chest tightness.  Discussed with cardiology.  They will see patient. Given his coarse lung sounds added a PE study in addition to his CT head. [AA]  1514 S - Chest pain, eye droop and left sided burning. Neuro consulted, MRI. Cards consulted for elevated trop.  [DZ]  1516 EKG 12-Lead [AA]    Clinical Course User Index [AA] Hildegard Loge, PA-C [DZ] Laurita Sieving, MD   Medical Decision Making Amount and/or Complexity of Data Reviewed Labs: ordered. Radiology: ordered. ECG/medicine tests:  Decision-making details documented in ED Course.  Risk Prescription drug management. Decision regarding hospitalization.   Patient is a 79 year old male who presents today for evaluation of chest pain as well as neurologic symptoms.  At the time my assessment the plan was to follow-up laboratory evaluation as well as imaging studies.  Patient's MRI did not show any evidence of acute lesions.  Review of laboratory evaluation notable for elevated troponin as well as elevated BNP.  Patient was seen by cardiology after consultation from previous team due to ongoing tachycardia suspected secondary to atrial flutter.  Patient was provided metoprolol  as well as diltiazem  here in the emergency department as well as 40 mg of Lasix  for initial diuresis.   Given patient's persistent tachycardia, will need admission at this point in time for further management of his atrial flutter.  Cardiology following and plans for potential cardioversion during this hospitalization pending further workup.  Patient was admitted to the hospitalist service.       Laurita Sieving, MD 06/03/24 [id number]    Lenor Hollering, MD 06/03/24 [id number]  "

## 2024-06-03 NOTE — Telephone Encounter (Signed)
 Left generic message that issue with grant has been resolved and to ask him pharmacy to re-run prescription.

## 2024-06-03 NOTE — ED Provider Notes (Signed)
 " Herbert Evans   CSN: [id number] Arrival date & time: 06/03/24  1248     Patient presents with: Chest Pain   Herbert WATERWORTH Sr. is a 79 y.o. male.   79 year old male presents today for concern of chest tightness.  He has history of A-fib and he was concerned he may be in A-fib again.  He has had undergone multiple cardioversions as well as an ablation.  He used to be on amiodarone  but this was discontinued after his lobectomy.  Reports compliance with his Eliquis .  Wife also reports droopiness of the left eye, inability to smile, and tremors.  All of this is new today.  They noticed the symptoms around 7 AM.  No weakness.  The history is provided by the patient. No language interpreter was used.       Prior to Admission medications  Medication Sig Start Date End Date Taking? Authorizing Provider  albuterol  (ACCUNEB ) 0.63 MG/3ML nebulizer solution Take 1 ampule by nebulization every 6 (six) hours as needed for wheezing or shortness of breath.    [provider]  apixaban  (ELIQUIS ) 5 MG TABS tablet Take 1 tablet (5 mg total) by mouth 2 (two) times daily. 12/05/23   Tobb, Kardie, DO  aspirin  EC 81 MG tablet Take 81 mg by mouth every morning.    [provider]  Budeson-Glycopyrrol-Formoterol  (BREZTRI AEROSPHERE) 160-9-4.8 MCG/ACT AERO Inhale 2 puffs into the lungs daily.    [provider]  donepezil  (ARICEPT ) 10 MG tablet Take 10 mg by mouth daily. 04/08/22   [provider]  ezetimibe  (ZETIA ) 10 MG tablet Take 1 tablet (10 mg total) by mouth daily. 02/19/24   Tobb, Kardie, DO  FARXIGA  10 MG TABS tablet Take 10 mg by mouth daily. 04/02/21   [provider]  fluticasone  (FLONASE) 50 MCG/ACT nasal spray Place 1 spray into both nostrils daily as needed for allergies or rhinitis.    [provider]  gabapentin  (NEURONTIN ) 300 MG capsule Take 300 mg by mouth at bedtime. 05/09/23    [provider]  lidocaine  (LIDODERM ) 5 % Place 1 patch onto the skin daily. Remove & Discard patch within 12 hours or as directed by MD 04/19/23   Melvenia Motto, MD  memantine  (NAMENDA ) 10 MG tablet Take 10 mg by mouth 2 (two) times daily. 11/02/19   [provider]  metFORMIN (GLUCOPHAGE) 1000 MG tablet Take 1,000 mg by mouth 2 (two) times daily with a meal.     [provider]  NON FORMULARY CPAP at bedtime.    [provider]  traZODone  (DESYREL ) 100 MG tablet Take 150 mg by mouth at bedtime. 08/02/19   [provider]  TRESIBA FLEXTOUCH 200 UNIT/ML FlexTouch Pen Inject 10 Units into the skin at bedtime. 05/09/23   [provider]    Allergies: Atorvastatin and Rosuvastatin    Review of Systems  Constitutional:  Negative for chills and fever.  Respiratory:  Negative for shortness of breath.   Cardiovascular:  Positive for chest pain.  All other systems reviewed and are negative.   Updated Vital Signs BP (!) 134/93 (BP Location: Right Arm)   Pulse (!) 128   Temp 98.8 F (37.1 C) (Oral)   Resp 19   Ht 5' 11 (1.803 m)   Wt 82.6 kg   BMI 25.38 kg/m   Physical Exam Vitals and nursing Evans reviewed.  Constitutional:      General: He  is not in acute distress.    Appearance: Normal appearance. He is not ill-appearing.  HENT:     Head: Normocephalic and atraumatic.     Nose: Nose normal.  Eyes:     Conjunctiva/sclera: Conjunctivae normal.  Pulmonary:     Effort: Pulmonary effort is normal. No respiratory distress.  Musculoskeletal:        General: No deformity.  Skin:    Findings: No rash.  Neurological:     General: No focal deficit present.     Mental Status: He is alert and oriented to person, place, and time. Mental status is at baseline.     Cranial Nerves: No cranial nerve deficit.     Motor: No weakness.     Comments: No facial droop.  Cranial nerves III through XII intact.  No pronator drift.  Tremors noted in  bilateral upper extremities.  Symmetrical strength in bilateral lower EXTR.     (all labs ordered are listed, but only abnormal results are displayed) Labs Reviewed  CBC WITH DIFFERENTIAL/PLATELET  COMPREHENSIVE METABOLIC PANEL WITH GFR  MAGNESIUM   URINALYSIS, ROUTINE W REFLEX MICROSCOPIC  TROPONIN T, HIGH SENSITIVITY    EKG: EKG Interpretation Date/Time:  Thursday June 03 2024 12:54:42 EST Ventricular Rate:  128 PR Interval:  72 QRS Duration:  145 QT Interval:  391 QTC Calculation: 571 R Axis:   127  Text Interpretation: Sinus or ectopic atrial tachycardia RBBB and LPFB Since last tracing rate faster Otherwise no significant change Confirmed by Emil Share [phone]) on 06/03/2024 1:26:04 PM  Radiology: [facility] Chest Portable 1 View Result Date: 06/03/2024 CLINICAL DATA:  Chest tightness. EXAM: PORTABLE CHEST 1 VIEW COMPARISON:  Chest radiograph dated 08/13/2023. FINDINGS: There is cardiomegaly with vascular congestion. Elevation of the left hemidiaphragm with left lung base chronic consolidation which may represent scarring. A small left pleural effusion and left lung base infiltrate is not excluded. Follow-up recommended. Left apical subpleural scarring. No pneumothorax. No acute osseous pathology. IMPRESSION: 1. Cardiomegaly with vascular congestion. 2. Chronic elevation of the left hemidiaphragm with left lung base scarring. A small left pleural effusion and left lung base infiltrate is not excluded. Electronically Signed   By: Vanetta Chou M.D.   On: 06/03/2024 13:46     Procedures   Medications Ordered in the ED  sodium chloride  0.9 % bolus 1,000 mL (has no administration in time range)    Clinical Course as of 06/03/24 1521  Thu Jun 03, 2024  1432 Patient remains with generalized tremors.  His CBC shows no leukocytosis.  Hemoglobin is 11.2 which is downtrending.  He denies any melanotic stools, bright red blood per rectum, or hematemesis.  His baseline appears to be  between 13-15. [AA]  1456 Hemoccult performed. [AA]  1457 Patient still with tremors and still tachycardic.  His troponin is elevated at 25.  He does have some chest tightness.  Discussed with cardiology.  They will see patient. Given his coarse lung sounds added a PE study in addition to his CT head. [AA]  1514 S - Chest pain, eye droop and left sided burning. Neuro consulted, MRI. Cards consulted for elevated trop.  [DZ]  1516 EKG 12-Lead [AA]    Clinical Course User Index [AA] Hildegard Loge, PA-C [DZ] Laurita Sieving, MD                                 Medical Decision Making Amount and/or Complexity of  Data Reviewed Labs: ordered. Radiology: ordered.   Medical Decision Making / ED Course   This patient presents to the ED for concern of tremors, droopiness of the left eye, chest pain, this involves an extensive number of treatment options, and is a complaint that carries with it a high risk of complications and morbidity.  The differential diagnosis includes ACS, PE, pneumonia, MSK pain, dissection, viral URI, CVA, TIA, Bell's palsy, other infection such as UTI  MDM: 79 year old male presents with above-mentioned complaints. He has generalized tremors.  He is tachycardic at 128.  This rhythm appears normal and does not appear to be in A-fib.  He is compliant with Eliquis . He received some fluids with EMS.  Will give additional liter fluid bolus.  Hemoccult negative.  Troponin mildly elevated.  Renal function preserved.  Cardiology will see from the standpoint of troponin leak.  Will continue to trend troponin.  Chest x-ray without acute cardiopulmonary process.  Given his mild confusion that his family reports he had but which is not unusual given his history of dementia will add on CT head.  Will obtain CT angio chest PE study.  Case discussed with attending.  Patient at the end of my shift is awaiting for his MRI, CT imaging, urinalysis.  Signed out to oncoming  provider.    Additional history obtained: -Additional history obtained from wife and daughter -External records from outside source obtained and reviewed including: Chart review including previous notes, labs, imaging, consultation notes   Lab Tests: -I ordered, reviewed, and interpreted labs.   The pertinent results include:   Labs Reviewed  CBC WITH DIFFERENTIAL/PLATELET - Abnormal; Notable for the following components:      Result Value   RBC 3.90 (*)    Hemoglobin 11.2 (*)    HCT 34.6 (*)    Platelets 146 (*)    All other components within normal limits  COMPREHENSIVE METABOLIC PANEL WITH GFR - Abnormal; Notable for the following components:   Glucose, Bld 121 (*)    Calcium  8.5 (*)    Total Protein 6.3 (*)    All other components within normal limits  TROPONIN T, HIGH SENSITIVITY - Abnormal; Notable for the following components:   Troponin T High Sensitivity 25 (*)    All other components within normal limits  RESP PANEL BY RT-PCR (RSV, FLU A&B, COVID)  RVPGX2  MAGNESIUM   URINALYSIS, ROUTINE W REFLEX MICROSCOPIC  POC OCCULT BLOOD, ED  TROPONIN T, HIGH SENSITIVITY      EKG  EKG Interpretation Date/Time:  Thursday June 03 2024 14:52:08 EST Ventricular Rate:  128 PR Interval:  55 QRS Duration:  140 QT Interval:  357 QTC Calculation: 521 R Axis:   127  Text Interpretation: Sinus or ectopic atrial tachycardia RBBB and LPFB No significant change since last tracing Confirmed by Emil Share [phone]) on 06/03/2024 2:54:20 PM         Imaging Studies ordered: I ordered imaging studies including chest x-ray.  CT head, CT angio chest PE study, MRI brain are all pending at the end of my shift I independently visualized and interpreted imaging. I agree with the radiologist interpretation   Medicines ordered and prescription drug management: Meds ordered this encounter  Medications   sodium chloride  0.9 % bolus 1,000 mL    -I have reviewed the patients home  medicines and have made adjustments as needed  Consultations Obtained: I requested consultation with the cardiology and neurology,  and discussed lab and imaging findings as well  as pertinent plan - they recommend: Neurology Dr. Voncile recommends an MRI.  If this is abnormal we will consult neurology again.  If this is normal he likely needs ophthalmology evaluation.  Cardiology will see patient and provide recs.   Cardiac Monitoring: The patient was maintained on a cardiac monitor.  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    DOE (dyspnea on exertion)    History of colon cancer    03-01-2005   s/p  sigmoid colectomy w/ negative nodes  (07-27-2019  per pt last colonoscopy 2019 , no recurrence)   History of Helicobacter pylori infection    09/ 2006   History of kidney stones    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 - severe PNA age 44   Hypertension    Mixed hyperlipidemia    NSVT (nonsustained ventricular tachycardia) (HCC)    Paroxysmal ventricular tachycardia (HCC)    PAT (paroxysmal atrial tachycardia)    PNA (pneumonia) 10/13/2020   Pneumonia    PVC's (premature ventricular contractions)    RBBB (right bundle branch block)    Right ureteral stone    Type 2 diabetes mellitus (HCC)    followed by pcp --- (07-27-2019  checks blood sugar three times per week in am,  fasting sugar-- 125-160)   Wears dentures    upper      Dispostion: Signed out to oncoming  provider   Final diagnoses:  None    ED Discharge Orders     None          Hildegard Loge, PA-C 06/03/24 1526    Emil Share, DO 06/03/24 1533  "

## 2024-06-03 NOTE — ED Notes (Signed)
 Discontinued Cardizem  per Admitting MD. Pts HR in sinus rhythm at this time and EKG captured.

## 2024-06-03 NOTE — Consult Note (Addendum)
 "  Cardiology Consultation   Patient ID: Herbert GUIDICE Sr. MRN: [id number]; DOB: 10-17-1945  Admit date: 06/03/2024 Date of Consult: 06/03/2024  PCP:  Silvano Angeline FALCON, NP   [facility] Providers Cardiologist:  Dub Huntsman, DO   Patient Profile: Herbert TRUMBULL Sr. is a 79 y.o. 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  History of Present Illness: Mr. Herbert Evans is followed by EP clinic and A-fib clinic for A-fib/flutter previously on amiodarone .  He underwent A-fib pulsed field ablation 09/2023, found back to be in flutter 10/2023 and had subsequent cardioversion 11/06/2023.  Sinus rhythm noted 12/2023.  EF subsequently recovered 01/2024, EF 50 to 55%.  Last seen 03/2024 and without symptoms of atrial fibrillation/flutter.  He was without any cardiac complaints.  Amiodarone  was stopped this appointment.  Also has not been on beta-blocker therapy secondary to bradycardia.  Currently patient being evaluated for a constellation of symptoms of chest tightness, neurologic deficits with inability to smile, left droopiness/swelling in the left eye, muscle weakness worse on the right side, tremors.  CT of the head currently pending, CTA of the chest pending.  Brain MRI also ordered with consult to neurology.  Cardiology was consulted for chest tightness and elevated troponin.  First troponin was 25.  Chest x-ray significant for vascular congestion with small left pleural effusion and possible left lung base infiltrate.  Respiratory panel pending.  Also with new anemia 11.2 and low platelets 146.  Patient today is accompanied with his wife and daughter.  They report that at PCPs office earlier Tuesday noted to have rapid  ventricular rates, asymptomatic but heart rates had decreased down to the 70s.  He reports he normally is aware when he is in arrhythmia but did not notice at this PCPs office.  He woke up today with complaints of persistent chest tightness although not having any complaints currently.  He started having neurological symptoms as described in above which prompted emergency evaluation.  Interview was cut short because patient was taken to CT imaging.  He reports resolution of chest tightness, however has new peripheral edema, shortness of breath, orthopnea that started this morning.  He has had persistent cough.  Denied any flulike symptoms including fatigue, diarrhea, nausea, vomiting, fevers.   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    DOE (dyspnea on exertion)    History of colon cancer    03-01-2005   s/p  sigmoid colectomy w/ negative nodes  (07-27-2019  per pt last colonoscopy 2019 , no recurrence)   History of Helicobacter pylori infection    09/ 2006   History of kidney stones    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 - severe PNA age 56   Hypertension    Mixed hyperlipidemia    NSVT (nonsustained ventricular tachycardia) (HCC)    Paroxysmal ventricular tachycardia (HCC)    PAT (paroxysmal atrial tachycardia)    PNA (pneumonia) 10/13/2020   Pneumonia    PVC's (premature ventricular contractions)    RBBB (right bundle branch block)  Right ureteral stone    Type 2 diabetes mellitus (HCC)    followed by pcp --- (07-27-2019  checks blood sugar three times per week in am,  fasting sugar-- 125-160)   Wears dentures    upper    Past Surgical History:  Procedure Laterality Date   ATRIAL FIBRILLATION ABLATION N/A 09/11/2023   Procedure: ATRIAL FIBRILLATION ABLATION;  Surgeon: Inocencio Soyla Lunger, MD;  Location: [facility] INVASIVE CV LAB;  Service:  Cardiovascular;  Laterality: N/A;   CARDIAC CATHETERIZATION  02-09-2002   dr levern  @[facility]    ef 50-55% w/ 10-15% LAD   CARDIAC CATHETERIZATION  06-28-2016   dr launie @[facility]    for arrhythmia)--- normal coronaries, mild focal wall motion abnormallity of distal inferapical wall,  severe right innominate / subclavin tortuosity   CARDIOVERSION N/A 04/22/2022   Procedure: CARDIOVERSION;  Surgeon: Loni Soyla LABOR, MD;  Location: [facility] ENDOSCOPY;  Service: Cardiovascular;  Laterality: N/A;   CARDIOVERSION N/A 11/06/2023   Procedure: CARDIOVERSION;  Surgeon: Michele Richardson, DO;  Location: [facility] INVASIVE CV LAB;  Service: Cardiovascular;  Laterality: N/A;   CATARACT EXTRACTION W/ INTRAOCULAR LENS  IMPLANT, BILATERAL  2018   COLON RESECTION SIGMOID  03-01-2005   @[facility]    CYSTOSCOPY WITH RETROGRADE PYELOGRAM, URETEROSCOPY AND STENT PLACEMENT Right 08/03/2019   Procedure: CYSTOSCOPY WITH RIGHT  RETROGRADE PYELOGRAM, URETEROSCOPY HOLMIUMM LASER AND STENT PLACEMENT;  Surgeon: Watt Rush, MD;  Location: [facility];  Service: Urology;  Laterality: Right;   CYSTOSCOPY/RETROGRADE/URETEROSCOPY/STONE EXTRACTION WITH BASKET  1970s   IR KYPHO LUMBAR INC FX REDUCE BONE BX UNI/BIL CANNULATION INC/IMAGING  06/05/2023   LUNG REMOVAL, PARTIAL Left 1967   lower lobe for pneumonia per pt   TONSILLECTOMY AND ADENOIDECTOMY  child     Scheduled Meds:  LORazepam   1 mg Oral Once   Continuous Infusions:  PRN Meds:   Allergies:   Allergies[1]  Social History:   Social History   Socioeconomic History   Marital status: Married    Spouse name: Not on file   Number of children: Not on file   Years of education: Not on file   Highest education level: Not on file  Occupational History   Not on file  Tobacco Use   Smoking status: Former    Types: Cigars   Smokeless tobacco: Current    Types: Snuff, Chew   Tobacco comments:    01/26/20 dips still last cigar was in 1976  Substance and Sexual Activity   Alcohol   use: No   Drug use: Never   Sexual activity: Not on file  Other Topics Concern   Not on file  Social History Narrative   Not on file   Social Drivers of Health   Tobacco Use: High Risk (03/16/2024)   Patient History    Smoking Tobacco Use: Former    Smokeless Tobacco Use: Current    Passive Exposure: Not on Actuary Strain: Not on file  Food Insecurity: Not on file  Transportation Needs: Not on file  Physical Activity: Not on file  Stress: Not on file  Social Connections: Not on file  Intimate Partner Violence: Not on file  Depression (PHQ2-9): Not on file  Alcohol  Screen: Not on file  Housing: Not on file  Utilities: Not on file  Health Literacy: Not on file    Family History:   Family History  Problem Relation Age of Onset   Asthma Mother    Heart attack Mother    Stroke Father  Lung cancer Sister    Lung cancer Sister    Lung cancer Sister      ROS:  Please see the history of present illness.  All other ROS reviewed and negative.     Physical Exam/Data: Vitals:   06/03/24 1252 06/03/24 1351 06/03/24 1357 06/03/24 1530  BP: (!) 134/93 (!) 127/92  (!) 129/94  Pulse: (!) 128 (!) 128  (!) 128  Resp: 19 13  14   Temp: 98.8 F (37.1 C)     TempSrc: Oral     SpO2:  100% 100% 99%  Weight:      Height:       No intake or output data in the 24 hours ending 06/03/24 1613    06/03/2024   12:51 PM 03/16/2024    2:07 PM 12/23/2023    2:51 PM  Last 3 Weights  Weight (lbs) 182 lb 181 lb 176 lb 9.6 oz  Weight (kg) 82.555 kg 82.101 kg 80.105 kg     Body mass index is 25.38 kg/m.  General:  Well nourished, well developed, in no acute distress HEENT: normal Neck: + JVD Vascular: No carotid bruits; Distal pulses 2+ bilaterally Cardiac:  normal S1, S2; RRR; no murmur  Lungs:  +rhonchi throughout Abd: soft, nontender, no hepatomegaly  Ext: 1+ edema Musculoskeletal:  No deformities, BUE and BLE strength normal and equal Skin: warm and dry  Neuro:   CNs 2-12 intact, no focal abnormalities noted Psych:  Normal affect   EKG:  The EKG was personally reviewed and demonstrates: Suspect atrial flutter, heart rate 128.  RBBB/LPFB.  Atrial tachycardia? Telemetry:  Telemetry was personally reviewed and demonstrates: Atrial flutter heart rate 130  Relevant CV Studies: Echocardiogram 02/02/2024 1. Left ventricular ejection fraction, by estimation, is 50 to 55%. The  left ventricle has low normal function. The left ventricle has no regional  wall motion abnormalities. Left ventricular diastolic parameters are  consistent with Grade III diastolic  dysfunction (restrictive).   2. Right ventricular systolic function is normal. The right ventricular  size is normal. There is normal pulmonary artery systolic pressure. The  estimated right ventricular systolic pressure is 35.5 mmHg.   3. Left atrial size was mildly dilated.   4. The mitral valve is normal in structure. Mild mitral valve  regurgitation. No evidence of mitral stenosis.   5. The aortic valve is tricuspid. Aortic valve regurgitation is not  visualized. Aortic valve sclerosis is present, with no evidence of aortic  valve stenosis.   6. Pulmonic valve regurgitation is moderate.   7. The inferior vena cava is normal in size with greater than 50%  respiratory variability, suggesting right atrial pressure of 3 mmHg.   Comparison(s): A prior study was performed on 01/23/2022. LVEF 40-45%,  global hypokinesis, mild to moderate MR, estimated RAP .     Laboratory Data: High Sensitivity Troponin:  No results for input(s): TROPONINIHS in the last 720 hours.  Recent Labs  Lab 06/03/24 1404  TRNPT 25*      Chemistry Recent Labs  Lab 06/03/24 1404  NA 139  K 4.0  CL 103  CO2 24  GLUCOSE 121*  BUN 10  CREATININE 0.89  CALCIUM  8.5*  MG 1.8  GFRNONAA >60  ANIONGAP 12    Recent Labs  Lab 06/03/24 1404  PROT 6.3*  ALBUMIN 3.7  AST 18  ALT 19  ALKPHOS 80  BILITOT 0.5    Lipids No results for input(s): CHOL, TRIG, HDL, LABVLDL, LDLCALC, CHOLHDL in  the last 168 hours.  Hematology Recent Labs  Lab 06/03/24 1404  WBC 5.4  RBC 3.90*  HGB 11.2*  HCT 34.6*  MCV 88.7  MCH 28.7  MCHC 32.4  RDW 14.2  PLT 146*   Thyroid  No results for input(s): TSH, FREET4 in the last 168 hours.  BNPNo results for input(s): BNP, PROBNP in the last 168 hours.  DDimer No results for input(s): DDIMER in the last 168 hours.  Radiology/Studies:  DG Chest Portable 1 View Result Date: 06/03/2024 CLINICAL DATA:  Chest tightness. EXAM: PORTABLE CHEST 1 VIEW COMPARISON:  Chest radiograph dated 08/13/2023. FINDINGS: There is cardiomegaly with vascular congestion. Elevation of the left hemidiaphragm with left lung base chronic consolidation which may represent scarring. A small left pleural effusion and left lung base infiltrate is not excluded. Follow-up recommended. Left apical subpleural scarring. No pneumothorax. No acute osseous pathology. IMPRESSION: 1. Cardiomegaly with vascular congestion. 2. Chronic elevation of the left hemidiaphragm with left lung base scarring. A small left pleural effusion and left lung base infiltrate is not excluded. Electronically Signed   By: Vanetta Chou M.D.   On: 06/03/2024 13:46     Assessment and Plan:  Persistent atrial fibrillation/flutter -09/2023 afib ablation -10/2023 successful DCCV -03/2024 likely in NSR no EKG at OV.  Amio stopped. Patient presenting today with complaints of chest tightness and possible strokelike symptoms and seems to be back in atrial flutter with RVR.  May be atypical flutter.  He has fixed ventricular rates around 130. Continue with Eliquis  5 mg twice daily.  Wife reports no missed doses. Would verify just in case.  We will start with IV amiodarone , could consider DCCV although schedule is full currently.  And with acute stroke like symptoms may be prohibitive.  Will make n.p.o. at midnight  just in case and then reevaluate tomorrow if DCCV is pursued and if closer to euvolemia. Eventually will need follow-up with EP to consider possible ablation again or other long-term rhythm strategies.  Heart failure with recovered EF -2023 EF 40 to 45%, probably tachymediated -01/2024 EF recovered 50 to 55%, G3 DD.  Mild MR.  Moderate PR. He is volume up on exam, concern for acute CHF secondary to rapid ventricular rates. Will start with IV Lasix  40 mg twice daily Would like to get better controlled rates before repeating echocardiogram. Continue Farxiga  10 mg.    Possible CVA Presentation concerning for acute CVA.  Reporting right-sided weakness, inability to smile, left eye droop/swelling.  CT of the head and brain MRI currently pending.  Neurology has been consulted.  Elevated troponins Reported chest tightness this morning.  First troponin is 25.  Suspect demand ischemia related to RVR.  Follow-up on second troponin but lower suspicion for ACS at this time.  He has no complaints of chest discomfort currently.   Risk Assessment/Risk Scores:   New [place]  Heart Association (NYHA) Functional Class NYHA Class III  CHA2DS2-VASc Score = 5   This indicates a 7.2% annual risk of stroke. The patient's score is based upon: CHF History: 1 HTN History: 1 Diabetes History: 1 Stroke History: 0 Vascular Disease History: 0 Age Score: 2 Gender Score: 0    For questions or updates, please contact [facility] Please consult [email or web address] for contact info under     Signed, Thom LITTIE Sluder, PA-C  06/03/2024 4:13 PM       [1]  Allergies Allergen Reactions   Atorvastatin Other (See Comments)    myalgias   Rosuvastatin Other (  See Comments)    myalgias   "

## 2024-06-04 DIAGNOSIS — I4892 Unspecified atrial flutter: Secondary | ICD-10-CM | POA: Diagnosis not present

## 2024-06-04 LAB — GLUCOSE, CAPILLARY
Glucose-Capillary: 148 mg/dL — ABNORMAL HIGH (ref 70–99)
Glucose-Capillary: 211 mg/dL — ABNORMAL HIGH (ref 70–99)
Glucose-Capillary: 158 mg/dL — ABNORMAL HIGH (ref 70–99)

## 2024-06-04 LAB — BASIC METABOLIC PANEL WITH GFR
Anion gap: 11 (ref 5–15)
BUN: 12 mg/dL (ref 8–23)
CO2: 22 mmol/L (ref 22–32)
Calcium: 8.8 mg/dL — ABNORMAL LOW (ref 8.9–10.3)
Chloride: 103 mmol/L (ref 98–111)
Creatinine, Ser: 1.03 mg/dL (ref 0.61–1.24)
GFR, Estimated: >60 mL/min
Glucose, Bld: 263 mg/dL — ABNORMAL HIGH (ref 70–99)
Potassium: 4.2 mmol/L (ref 3.5–5.1)
Sodium: 136 mmol/L (ref 135–145)

## 2024-06-04 LAB — HEMOGLOBIN A1C
Hgb A1c MFr Bld: 7.5 % — ABNORMAL HIGH (ref 4.8–5.6)
Mean Plasma Glucose: 168.55 mg/dL

## 2024-06-04 LAB — CBC
HCT: 37.1 % — ABNORMAL LOW (ref 39.0–52.0)
Hemoglobin: 11.5 g/dL — ABNORMAL LOW (ref 13.0–17.0)
MCH: 28.1 pg (ref 26.0–34.0)
MCHC: 31 g/dL (ref 30.0–36.0)
MCV: 90.7 fL (ref 80.0–100.0)
Platelets: 153 K/uL (ref 150–400)
RBC: 4.09 MIL/uL — ABNORMAL LOW (ref 4.22–5.81)
RDW: 14.6 % (ref 11.5–15.5)
WBC: 5.4 K/uL (ref 4.0–10.5)
nRBC: 0 % (ref 0.0–0.2)

## 2024-06-04 LAB — CBG MONITORING, ED: Glucose-Capillary: 222 mg/dL — ABNORMAL HIGH (ref 70–99)

## 2024-06-04 MED ORDER — DILTIAZEM HCL-DEXTROSE 125-5 MG/125ML-% IV SOLN (PREMIX)
5.0000 mg/h | INTRAVENOUS | Status: DC
  Administered 2024-06-04: 5 mg/h via INTRAVENOUS
  Administered 2024-06-04: 7.5 mg/h via INTRAVENOUS
  Filled 2024-06-04 (×3): qty 125

## 2024-06-04 MED ORDER — METOPROLOL TARTRATE 50 MG PO TABS
50.0000 mg | ORAL_TABLET | Freq: Two times a day (BID) | ORAL | Status: DC
  Administered 2024-06-04: 50 mg via ORAL
  Filled 2024-06-04: qty 1

## 2024-06-04 MED ORDER — FUROSEMIDE 10 MG/ML IJ SOLN
40.0000 mg | Freq: Once | INTRAMUSCULAR | Status: AC
  Administered 2024-06-04: 40 mg via INTRAVENOUS
  Filled 2024-06-04: qty 4

## 2024-06-04 MED ORDER — EZETIMIBE 10 MG PO TABS
10.0000 mg | ORAL_TABLET | Freq: Every day | ORAL | Status: DC
  Administered 2024-06-04 – 2024-06-09 (×6): 10 mg via ORAL
  Filled 2024-06-04 (×7): qty 1

## 2024-06-04 NOTE — Evaluation (Signed)
 Physical Therapy Evaluation Patient Details Name: Herbert Evans. MRN: [id number] DOB: February 25, 1946 Today's Date: 06/04/2024  History of Present Illness  79 yo M adm 06/03/24 with chest tightness, Aflutter. PMHx: tremors, T2DM, Afib, CHF, memory loss, chronic cough, HLD  Clinical Impression  Pt pleasant, eager to get to bathroom and needing cues for lines and pulling at IV on arrival. Pt able to walk in hall and return to room with 2 partial LOB, SOB and fatigue. HR 74-92 with activity and SPO2 96% on RA with pt reporting moderate DOE. 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 Pt with decreased activity tolerance and gait this session who will benefit from acute therapy to maximize mobility and independence prior to return home. Encouraged daily ambulation and mobility with staff.          If plan is discharge home, recommend the following: Help with stairs or ramp for entrance;A little help with walking and/or transfers   Can travel by private vehicle        Equipment Recommendations None recommended by PT  Recommendations for Other Services       Functional Status Assessment Patient has had a recent decline in their functional status and/or demonstrates limited ability to make significant improvements in function in a reasonable and predictable amount of time     Precautions / Restrictions Precautions Precautions: Fall Recall of Precautions/Restrictions: Intact      Mobility  Bed Mobility Overal bed mobility: Modified Independent                  Transfers Overall transfer level: Modified independent                      Ambulation/Gait Ambulation/Gait assistance: Contact guard assist Gait Distance (Feet): 200 Feet Assistive device: None Gait Pattern/deviations: Step-through pattern, Decreased stride length   Gait velocity interpretation: 1.31 - 2.62 ft/sec, indicative of limited community  ambulator   General Gait Details: pt with 2 partial LOB with CGA for safety, no physical assist and pt encouraged to use cane for balance and fall prevention  Stairs            Wheelchair Mobility     Tilt Bed    Modified Rankin (Stroke Patients Only)       Balance Overall balance assessment: Mild deficits observed, not formally tested                                           Pertinent Vitals/Pain Pain Assessment Pain Assessment: No/denies pain    Home Living Family/patient expects to be discharged to:: Private residence Living Arrangements: Spouse/significant other Available Help at Discharge: Family;Available PRN/intermittently Type of Home: Other(Comment) (camper) Home Access: Ramped entrance       Home Layout: One level Home Equipment: Cane - single point      Prior Function Prior Level of Function : Independent/Modified Independent;Driving             Mobility Comments: walks with cane intermittently ADLs Comments: independent with ADLs/IADls and assists wife with shoes and shaving     Extremity/Trunk Assessment   Upper Extremity Assessment Upper Extremity Assessment: Overall WFL for tasks assessed    Lower Extremity Assessment Lower Extremity Assessment: Overall WFL for tasks assessed    Cervical / Trunk Assessment  Cervical / Trunk Assessment: Normal  Communication   Communication Communication: No apparent difficulties    Cognition Arousal: Alert Behavior During Therapy: WFL for tasks assessed/performed   PT - Cognitive impairments: No apparent impairments                         Following commands: Intact       Cueing Cueing Techniques: Verbal cues     General Comments      Exercises     Assessment/Plan    PT Assessment Patient needs continued PT services  PT Problem List Decreased balance;Decreased mobility;Decreased activity tolerance       PT Treatment Interventions Gait  training;Functional mobility training;Therapeutic activities;Patient/family education    PT Goals (Current goals can be found in the Care Plan section)  Acute Rehab PT Goals Patient Stated Goal: return home PT Goal Formulation: With patient Time For Goal Achievement: 06/11/24 Potential to Achieve Goals: Good    Frequency Min 1X/week     Co-evaluation               AM-PAC PT 6 Clicks Mobility  Outcome Measure Help needed turning from your back to your side while in a flat bed without using bedrails?: None Help needed moving from lying on your back to sitting on the side of a flat bed without using bedrails?: None Help needed moving to and from a bed to a chair (including a wheelchair)?: None Help needed standing up from a chair using your arms (e.g., wheelchair or bedside chair)?: A Little Help needed to walk in hospital room?: A Little Help needed climbing 3-5 steps with a railing? : A Little 6 Click Score: 21    End of Session   Activity Tolerance: Patient tolerated treatment well Patient left: in chair;with call bell/phone within reach;with chair alarm set Nurse Communication: Mobility status PT Visit Diagnosis: Other abnormalities of gait and mobility (R26.89);Difficulty in walking, not elsewhere classified (R26.2)    Time: 8760-8741 PT Time Calculation (min) (ACUTE ONLY): 19 min   Charges:   PT Evaluation $PT Eval Low Complexity: 1 Low   PT General Charges $$ ACUTE PT VISIT: 1 Visit         Lenoard SQUIBB, PT Acute Rehabilitation Services Office: [phone]   Lenoard NOVAK Riddhi Grether 06/04/2024, 1:24 PM

## 2024-06-04 NOTE — Progress Notes (Cosign Needed Addendum)
 "  Progress Note  Patient Name: Herbert GRIEP Sr. Date of Encounter: 06/04/2024  [facility] Cardiologist: [name], DO   Interval Summary   Shared his breathing has improved from yesterday. Denied chest pain, did report palpitations. Had breakfast that went well.   Vital Signs Vitals:   06/04/24 0618 06/04/24 0730 06/04/24 0731 06/04/24 0926  BP:  133/64  (!) 130/94  Pulse:  75  (!) 46  Resp:  20    Temp: 97.6 F (36.4 C) 98.4 F (36.9 C)    TempSrc: Oral Oral    SpO2:  97% 96%   Weight:      Height:        Intake/Output Summary (Last 24 hours) at 06/04/2024 0943 Last data filed at 06/04/2024 0155 Gross per 24 hour  Intake --  Output 600 ml  Net -600 ml      06/03/2024   12:51 PM 03/16/2024    2:07 PM 12/23/2023    2:51 PM  Last 3 Weights  Weight (lbs) 182 lb 181 lb 176 lb 9.6 oz  Weight (kg) 82.555 kg 82.101 kg 80.105 kg      Telemetry/ECG  AF HR 100, PVCs - Personally Reviewed  Physical Exam  GEN: No acute distress.   Neck: JVD Cardiac: Irregularly, irregular, no murmurs Respiratory: Wheezing and coarse rhonchi bilaterally  GI: Soft, nontender, non-distended  MS: No edema  Assessment & Plan  Herbert TURNBO Sr. is a 79 y.o. 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 CXR showed possible evidence of volume overload. CT chest showed pneumonia to RUL and small RT pleural effusion. Head imaging without acute pathology. Troponin 25 -> 22.    Atrial FibrillationFlutter, with episode of RVR Currently in AF HR 100 Chad Vas score 5 Keep K > 4 and Mag  > 2  Patient referred to EP due to persistent afib on Amiodarone . Afib pulse field ablation completed on 09/11/23. Patient seen in afib clinic on 10/30/23, found to be in atrial flutter. Patient then underwent cardioversion on 11/06/23, converted to sinus bradycardia. At OP visit 03/2024 amiodarone  was stopped.   Primary team noted patient to convert to sinus rhythm and cardizem  was stopped yesterday.  Patient missed several DOAC doses. Will need TEE with DCCV, pursue Monday.  Increase lopressor  to 50 mg BID  Continue IV cardizem , as able begin to wean Continue eliquis  5 mg BID   NSVT PVCs Previously was managed by BB though after converting to sinus bradycardia 10/2023 it was held.  BB as above, may need to re-assess after DCCV as he converted to bradycardia last time.   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  On exam today, still appears volume up.  Give IV lasix  40 mg  Continue farxiga  10 mg  If able to continue BB, consolidate at discharge Has not been on MRA could consider starting prior to dc though will hold off to have BP room for HR control With disequilibrium would hold on ARB as well  as for BP room  Hyperlipidemia Statin allergy Restart PTA zetia  10 mg  Per primary T2DM Dementia Disequilibrium    For questions or updates, please contact [facility] Please consult [email or web address] for contact info under       Signed, Leontine LOISE Salen, PA-C   "

## 2024-06-04 NOTE — Progress Notes (Signed)
 " PROGRESS NOTE  Herbert FONTANILLA Sr.  FMW:[id number] DOB: 28-Feb-1946 DOA: 06/03/2024 PCP: Silvano Angeline FALCON, NP   Brief Narrative: Patient is a 79 year old male with history of diabetes type 2, A-fib/a flutter status post ablation/DCCV, HF with improved EF, memory loss who presented with complaint of palpitations, balance issues, left eye drooping.  No focal weakness or numbness on presentation.  On presentation, he was in A-fib with RVR with heart rate in the range of 120s, rhythm was A-fib.  CT chest negative for PE.  No acute findings in MRI.  Patient admitted for the management of A-fib with RVR.  Started on Cardizem  drip.  Cardiology consulted.  Lab work showed elevated BNP  Assessment & Plan:  Principal Problem:   Atrial flutter with rapid ventricular response (HCC) Active Problems:   Type 2 diabetes mellitus with complication, without long-term current use of insulin  (HCC)   Acute on chronic heart failure with preserved ejection fraction (HFpEF) (HCC)   Rapid atrial flutter: He is status post ablation/DCCV.  Presented with A-fib.  Started on Cardizem  drip.  Cardiology following.  Cardiology planning for TEE/DCCV during this admission.  Started on Eliquis .  Remains in A-fib rhythm today.  On metoprolol  and ivCardizem  Acute on chronic HFimpEF: Echo on September 25 showed EF of 50 to 55%.  Given IV Lasix .  Continue Farxiga .  Elevated BNP on presentation.  Given another dose of IV Lasix  this morning.  Continue Farxiga   Type 2 diabetes: On Farxiga .  Currently on sliding scale  History of hyperlipidemia: Intolerant to statin.  Plan to start on Zetia .  Concern for stroke/balance issues/left eye droop: There was concern for left facial weakness, balance issues.  No acute findings as per MRI.  PT consulted  Memory issues: On Namenda , Aricept .  Continue delirium precautions        DVT prophylaxis: apixaban  (ELIQUIS ) tablet 5 mg     Code Status: Full Code  Family Communication: Husband  at bedside  Patient status:Inpatient  Patient is from :home  Anticipated discharge un:ynfz  Estimated DC date:1-2 days   Consultants: Cardiology  Procedures:Plan for DCCV  Antimicrobials:  Anti-infectives (From admission, onward)    None       Subjective: Patient seen and examined at bedside today.  She remains comfortable.  CV was back in A-fib rhythm.  Heart rate well-controlled in the range of 90-100.  Denies shortness of breath or cough.  On room air  Objective: Vitals:   06/04/24 0615 06/04/24 0618 06/04/24 0730 06/04/24 0731  BP: 126/69  133/64   Pulse: 60  75   Resp: 16  20   Temp:  97.6 F (36.4 C) 98.4 F (36.9 C)   TempSrc:  Oral Oral   SpO2: 97%  97% 96%  Weight:      Height:        Intake/Output Summary (Last 24 hours) at 06/04/2024 0815 Last data filed at 06/04/2024 0155 Gross per 24 hour  Intake --  Output 600 ml  Net -600 ml   Filed Weights   06/03/24 1251  Weight: 82.6 kg    Examination:  General exam: Overall comfortable, not in distress HEENT: PERRL Respiratory system:  no wheezes or crackles  Cardiovascular system: irregular irregular rhythm Gastrointestinal system: Abdomen is nondistended, soft and nontender. Central nervous system: Alert and oriented Extremities: No edema, no clubbing ,no cyanosis Skin: No rashes, no ulcers,no icterus     Data Reviewed: I have personally reviewed following labs and imaging studies  CBC: Recent Labs  Lab 06/03/24 1404 06/04/24 0257  WBC 5.4 5.4  NEUTROABS 3.9  --   HGB 11.2* 11.5*  HCT 34.6* 37.1*  MCV 88.7 90.7  PLT 146* 153   Basic Metabolic Panel: Recent Labs  Lab 06/03/24 1404 06/04/24 0257  NA 139 136  K 4.0 4.2  CL 103 103  CO2 24 22  GLUCOSE 121* 263*  BUN 10 12  CREATININE 0.89 1.03  CALCIUM  8.5* 8.8*  MG 1.8  --      Recent Results (from the past 240 hours)  Resp panel by RT-PCR (RSV, Flu A&B, Covid) Anterior Nasal Swab     Status: None   Collection Time:  06/03/24  2:33 PM   Specimen: Anterior Nasal Swab  Result Value Ref Range Status   SARS Coronavirus 2 by RT PCR NEGATIVE NEGATIVE Final   Influenza A by PCR NEGATIVE NEGATIVE Final   Influenza B by PCR NEGATIVE NEGATIVE Final    Comment: (NOTE) The Xpert Xpress SARS-CoV-2/FLU/RSV plus assay is intended as an aid in the diagnosis of influenza from Nasopharyngeal swab specimens and should not be used as a sole basis for treatment. Nasal washings and aspirates are unacceptable for Xpert Xpress SARS-CoV-2/FLU/RSV testing.  Fact Sheet for Patients: bloggercourse.com  Fact Sheet for Healthcare Providers: seriousbroker.it  This test is not yet approved or cleared by the [place]  FDA and has been authorized for detection and/or diagnosis of SARS-CoV-2 by FDA under an Emergency Use Authorization (EUA). This EUA will remain in effect (meaning this test can be used) for the duration of the COVID-19 declaration under Section 564(b)(1) of the Act, 21 U.S.C. section 360bbb-3(b)(1), unless the authorization is terminated or revoked.     Resp Syncytial Virus by PCR NEGATIVE NEGATIVE Final    Comment: (NOTE) Fact Sheet for Patients: bloggercourse.com  Fact Sheet for Healthcare Providers: seriousbroker.it  This test is not yet approved or cleared by the [place]  FDA and has been authorized for detection and/or diagnosis of SARS-CoV-2 by FDA under an Emergency Use Authorization (EUA). This EUA will remain in effect (meaning this test can be used) for the duration of the COVID-19 declaration under Section 564(b)(1) of the Act, 21 U.S.C. section 360bbb-3(b)(1), unless the authorization is terminated or revoked.  Performed at [facility], [address], [place], [place] [address]      Radiology Studies: MR BRAIN WO CONTRAST Result Date: 06/03/2024 EXAM: MRI BRAIN WITHOUT  CONTRAST 06/03/2024 06:58:07 PM TECHNIQUE: Multiplanar multisequence MRI of the head/brain was performed without the administration of intravenous contrast. COMPARISON: Same day CT head and MRI head 11/11/2018. CLINICAL HISTORY: Transient ischemic attack (TIA). FINDINGS: BRAIN AND VENTRICLES: No acute infarct. No intracranial hemorrhage. No mass. No midline shift. No hydrocephalus. The sella is unremarkable. Normal flow voids. There are a few scattered areas of T2 and FLAIR hyperintensity in the periventricular and subcortical white matter compatible with chronic microvascular ischemic changes. There is redemonstration of a focal lesion within the left parietal calvarium near midline. The lesion demonstrates T2 and FLAIR hyperintensity with T1 hypointensity. There is asymmetric involvement of the inner table of the calvarium. There is thinning of the outer table which is better appreciated on same day CT. The apparent soft tissue seen on CT appears unrelated to the lesion. CSF partially fills the defect along the inner table created by the lesion. There is no involvement of brain parenchyma. The lesion is noted adjacent to the superior sagittal sinus and may reflect  a prominent arachnoid granulation. This focus is unchanged since at least 2020 and is favored benign. ORBITS: Bilateral lens replacement. SINUSES AND MASTOIDS: Mucosal thickening in the ethmoid and maxillary sinuses. BONES AND SOFT TISSUES: Normal marrow signal. No soft tissue abnormality. IMPRESSION: 1. Left parietal calvarium lesion is favored to reflect a prominent arachnoid granulation versus other benign etiology. Finding is unchanged since 2020. 2. No acute intracranial abnormality. Electronically signed by: Donnice Mania MD 06/03/2024 07:30 PM EST RP Workstation: [id number]   CT Angio Chest PE W and/or Wo Contrast Result Date: 06/03/2024 CLINICAL DATA:  Concern for pulmonary embolus EXAM: CT ANGIOGRAPHY CHEST WITH CONTRAST TECHNIQUE:  Multidetector CT imaging of the chest was performed using the standard protocol during bolus administration of intravenous contrast. Multiplanar CT image reconstructions and MIPs were obtained to evaluate the vascular anatomy. RADIATION DOSE REDUCTION: This exam was performed according to the departmental dose-optimization program which includes automated exposure control, adjustment of the mA and/or kV according to patient size and/or use of iterative reconstruction technique. CONTRAST:  75mL OMNIPAQUE  IOHEXOL  350 MG/ML SOLN COMPARISON:  Chest CT dated 06/03/2024. FINDINGS: Cardiovascular: Mild cardiomegaly. No pericardial effusion. There is coronary vascular calcification. There is mild atherosclerotic calcification of the thoracic aorta. No aneurysmal dilatation. Mildly dilated main pulmonary trunk suggestive of pulmonary hypertension. Evaluation of the pulmonary arteries is limited due to respiratory motion and streak artifact caused by patient's arms as well as due to suboptimal opacification of the distal branches and timing of the contrast. No central pulmonary artery embolus identified. Mediastinum/Nodes: No hilar adenopathy. Evaluation of the left hilum is limited due to consolidative changes of the left lung. A cluster of lymph node in the subcarinal region measures up to 2 cm in diameter. The esophagus is grossly unremarkable no mediastinal fluid collection. Lungs/Pleura: Postsurgical changes of the left lower lobectomy and left lung base scarring. There is mucus secretion or aspirated content in the left upper lobe bronchus. Bilateral bronchial wall thickening and peribronchial edema. Patchy area of ground-glass and reticulonodular density in the posterior left upper lobe as well as additional scattered faint ground-glass areas in the right upper lobe may represent bronchopneumonia. There is a small right pleural effusion with partial compressive atelectasis of the right lung base. No pneumothorax. Upper  Abdomen: No acute abnormality. Musculoskeletal: Osteopenia with degenerative changes of the spine. No acute osseous pathology. Review of the MIP images confirms the above findings. IMPRESSION: 1. No CT evidence of central pulmonary artery embolus. 2. Postsurgical changes of the left lower lobectomy and left lung base scarring. 3. Mucus secretion or aspirated content in the left upper lobe bronchus. 4. Patchy area of ground-glass and reticulonodular density in the posterior left upper lobe as well as additional scattered faint ground-glass areas in the right upper lobe may represent bronchopneumonia. 5. Small right pleural effusion with partial compressive atelectasis of the right lung base. 6.  Aortic Atherosclerosis (ICD10-I70.0). Electronically Signed   By: Vanetta Chou M.D.   On: 06/03/2024 16:47   CT Head Wo Contrast Result Date: 06/03/2024 EXAM: CT HEAD WITHOUT CONTRAST 06/03/2024 04:30:43 PM TECHNIQUE: CT of the head was performed without the administration of intravenous contrast. Automated exposure control, iterative reconstruction, and/or weight based adjustment of the mA/kV was utilized to reduce the radiation dose to as low as reasonably achievable. COMPARISON: 04/19/2023 CLINICAL HISTORY: Mental status change, unknown cause. FINDINGS: BRAIN AND VENTRICLES: No acute hemorrhage. No evidence of acute infarct. No hydrocephalus. No extra-axial collection. No mass effect or midline shift. Atherosclerosis  of the Carotid Siphons and Intracranial Vertebral Arteries. ORBITS: Bilateral lens replacement. SINUSES: Mucosal thickening throughout the paranasal sinuses most pronounced in the maxillary sinuses. SOFT TISSUES AND SKULL: There is a 1.2 x 1.0 x 2.2 cm irregular lytic lesion in the parietal calvarium left of midline primarily involving the inner table and diploic space with significant thinning of the outer table (series 4, image 25; series 8, image 33). There are likely areas of cortical breakthrough  along the outer table. Focal soft tissue thickening in the scalp overlying the lesion (series 8, image 31). This is overall unchanged compared to the 2024 study. Sequelae of left mastoidectomy. IMPRESSION: 1. No acute intracranial abnormality. 2. 1.2 x 1.0 x 2.2 cm irregular lytic lesion in the parietal calvarium left of midline with possible overlying soft tissue thickening. Findings unchanged compared to the 2024 study. Consider nonemergent MRI head with and without contrast for further characterization. Electronically signed by: Donnice Mania MD 06/03/2024 04:39 PM EST RP Workstation: [id number]   DG Chest Portable 1 View Result Date: 06/03/2024 CLINICAL DATA:  Chest tightness. EXAM: PORTABLE CHEST 1 VIEW COMPARISON:  Chest radiograph dated 08/13/2023. FINDINGS: There is cardiomegaly with vascular congestion. Elevation of the left hemidiaphragm with left lung base chronic consolidation which may represent scarring. A small left pleural effusion and left lung base infiltrate is not excluded. Follow-up recommended. Left apical subpleural scarring. No pneumothorax. No acute osseous pathology. IMPRESSION: 1. Cardiomegaly with vascular congestion. 2. Chronic elevation of the left hemidiaphragm with left lung base scarring. A small left pleural effusion and left lung base infiltrate is not excluded. Electronically Signed   By: Vanetta Chou M.D.   On: 06/03/2024 13:46    Scheduled Meds:  apixaban   5 mg Oral BID   dapagliflozin  propanediol  10 mg Oral Daily   donepezil   10 mg Oral QHS   gabapentin   300 mg Oral QHS   insulin  aspart  0-5 Units Subcutaneous QHS   insulin  aspart  0-6 Units Subcutaneous TID WC   memantine   10 mg Oral BID   metoprolol  tartrate  25 mg Oral BID   sodium chloride  flush  3 mL Intravenous Q12H   traZODone   150 mg Oral QHS   Continuous Infusions:  diltiazem  (CARDIZEM ) infusion 5 mg/hr (06/04/24 0308)     LOS: 1 day   Ivonne Mustache, MD [facility] P1/23/2026, 8:15  AM  "

## 2024-06-04 NOTE — Progress Notes (Signed)
 Received message that patient became lightheaded and clammy with shortness of breath while laying flat.Improved with sitting upright. Denied chest pain. Asymptomatic now.  RN shared at that time BP: 131/66  SpO2 98% on RA.  Telemetry showed ongoing atrial fibrillation though in bigeminy pattern. Rate controlled.   Receiving IV diuresis and per family good urine output. Patient's BB set to increase this afternoon.  Patient advised to transition slowly and let staff know if it occurs again. Will hold on further diuresis for now as he is stable and doesn't appear volume up on exam though ongoing coarse breath sounds that have been consistent this admission.

## 2024-06-04 NOTE — Progress Notes (Signed)
 Pt was c/o lightheadedness while laying down in bed. VSS. His daughter states he was also clammy. Noted he was having sustained bigeminy. Patient stated that he became fine after he sat up on the side of bed. Logan with cardiology made aware.

## 2024-06-04 NOTE — ED Notes (Signed)
 Pts HR back sustaining in 110-120s. Dr. Franky made aware and advised to notify him if pts HR sustains above 120. No new orders at this time.

## 2024-06-04 NOTE — ED Notes (Signed)
 Pt ambulated to bathroom with minimal assistance.

## 2024-06-04 NOTE — Progress Notes (Signed)
Heart Failure Navigator Progress Note  Assessed for Heart & Vascular TOC clinic readiness.  Patient does not meet criteria due to PMH of dementia.   Navigator available for reassessment of patient.   Sharen Hones, PharmD, BCPS Heart Failure Stewardship Pharmacist Phone [phone]

## 2024-06-05 DIAGNOSIS — I4892 Unspecified atrial flutter: Secondary | ICD-10-CM | POA: Diagnosis not present

## 2024-06-05 LAB — GLUCOSE, CAPILLARY
Glucose-Capillary: 252 mg/dL — ABNORMAL HIGH (ref 70–99)
Glucose-Capillary: 225 mg/dL — ABNORMAL HIGH (ref 70–99)
Glucose-Capillary: 136 mg/dL — ABNORMAL HIGH (ref 70–99)
Glucose-Capillary: 172 mg/dL — ABNORMAL HIGH (ref 70–99)

## 2024-06-05 LAB — BASIC METABOLIC PANEL WITH GFR
Anion gap: 12 (ref 5–15)
BUN: 20 mg/dL (ref 8–23)
CO2: 23 mmol/L (ref 22–32)
Calcium: 8.9 mg/dL (ref 8.9–10.3)
Chloride: 102 mmol/L (ref 98–111)
Creatinine, Ser: 1.13 mg/dL (ref 0.61–1.24)
GFR, Estimated: >60 mL/min
Glucose, Bld: 158 mg/dL — ABNORMAL HIGH (ref 70–99)
Potassium: 4.7 mmol/L (ref 3.5–5.1)
Sodium: 136 mmol/L (ref 135–145)

## 2024-06-05 LAB — CBC
HCT: 37.9 % — ABNORMAL LOW (ref 39.0–52.0)
Hemoglobin: 12 g/dL — ABNORMAL LOW (ref 13.0–17.0)
MCH: 28.1 pg (ref 26.0–34.0)
MCHC: 31.7 g/dL (ref 30.0–36.0)
MCV: 88.8 fL (ref 80.0–100.0)
Platelets: 199 10*3/uL (ref 150–400)
RBC: 4.27 MIL/uL (ref 4.22–5.81)
RDW: 14.6 % (ref 11.5–15.5)
WBC: 7.2 10*3/uL (ref 4.0–10.5)
nRBC: 0 % (ref 0.0–0.2)

## 2024-06-05 MED ORDER — METOPROLOL TARTRATE 25 MG PO TABS
25.0000 mg | ORAL_TABLET | Freq: Two times a day (BID) | ORAL | Status: DC
  Administered 2024-06-05 – 2024-06-07 (×6): 25 mg via ORAL
  Filled 2024-06-05 (×7): qty 1

## 2024-06-05 MED ORDER — ESCITALOPRAM OXALATE 10 MG PO TABS
20.0000 mg | ORAL_TABLET | Freq: Every day | ORAL | Status: DC
  Administered 2024-06-05 – 2024-06-09 (×5): 20 mg via ORAL
  Filled 2024-06-05 (×6): qty 2

## 2024-06-05 MED ORDER — FUROSEMIDE 20 MG PO TABS
20.0000 mg | ORAL_TABLET | Freq: Every day | ORAL | Status: DC
  Administered 2024-06-05 – 2024-06-07 (×3): 20 mg via ORAL
  Filled 2024-06-05 (×3): qty 1

## 2024-06-05 MED ORDER — GUAIFENESIN ER 600 MG PO TB12
600.0000 mg | ORAL_TABLET | Freq: Two times a day (BID) | ORAL | Status: DC
  Administered 2024-06-05 – 2024-06-09 (×9): 600 mg via ORAL
  Filled 2024-06-05 (×10): qty 1

## 2024-06-05 NOTE — Progress Notes (Signed)
 "  Progress Note  Patient Name: Herbert DEBOARD Sr. Date of Encounter: 06/05/2024 [facility] Cardiologist: [name], DO   Interval Summary   EKG overnight with possible SR with first deg AVB (long). Will repeat EKG this am. Seen walking the halls with mobility specialist, feels well and no hypoxia or dyspnea.   Vital Signs Vitals:   06/04/24 2300 06/04/24 2323 06/05/24 0318 06/05/24 0327  BP:  (!) 101/56 101/69 106/68  Pulse: 63 60 60 60  Resp:  18  18  Temp:  97.7 F (36.5 C)  98 F (36.7 C)  TempSrc:  Oral  Oral  SpO2: 90% 92% 93% 93%  Weight:      Height:        Intake/Output Summary (Last 24 hours) at 06/05/2024 0618 Last data filed at 06/04/2024 2320 Gross per 24 hour  Intake 334.08 ml  Output 1050 ml  Net -715.92 ml      06/04/2024   11:56 AM 06/03/2024   12:51 PM 03/16/2024    2:07 PM  Last 3 Weights  Weight (lbs) 185 lb 14.4 oz 182 lb 181 lb  Weight (kg) 84.324 kg 82.555 kg 82.101 kg      Telemetry/ECG   Afib 120s - Personally Reviewed  Physical Exam  GEN: No acute distress.   Neck: JVD Cardiac: iRRR no murmurs Respiratory: Wheezing and coarse rhonchi bilaterally  GI: Soft, nontender, non-distended  MS: No edema  Assessment & Plan  Herbert BANIK Sr. is a 79 y.o. 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 CXR showed possible evidence of volume overload. CT chest showed pneumonia to RUL and small RT pleural effusion. Head imaging without acute pathology. Troponin 25 -> 22.    Atrial FibrillationFlutter, with episode of RVR Currently in  Afib though I think briefly had SR overnight with long first deg avb. Chad Vas score 5 Keep K > 4 and Mag > 2  Patient referred to EP due to persistent afib on Amiodarone . Afib pulse field ablation completed on 09/11/23. Patient seen in afib clinic on 10/30/23, found to be in atrial flutter. Patient then underwent cardioversion on 11/06/23, converted to sinus bradycardia. At OP visit 03/2024 amiodarone  was stopped.   Primary team noted patient to convert to sinus rhythm and cardizem  was stopped yesterday.  Patient missed several DOAC doses. Will need TEE with DCCV, pursue Monday.  lopressor  25 mg BID. Originally planned for 50 bid but was hypotensive overnight and has history of bradycardia. If rates increase can either increase BB or resume IV cardizem . IV cardizem  on hold. Ok to observe. Continue eliquis  5 mg BID   NSVT PVCs Previously was managed by BB though after converting to sinus bradycardia 10/2023 it was held.  BB as above, may need to re-assess after DCCV as he converted to bradycardia last time.   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  On exam today, looks euvolemic. Cr mild elevation. - not on lasix  at home. Would start lasix   20 mg po daily here and observe. Continue farxiga  10 mg  If able to continue BB, consolidate at discharge Has not been on MRA could consider starting prior to dc though will hold off to have BP room for HR control With disequilibrium would hold on ARB as well as for BP room  Hyperlipidemia Statin allergy Restart PTA zetia  10 mg  Per primary T2DM Dementia Disequilibrium    For questions or updates, please contact [facility] Please consult [email or web address] for contact info under       Signed, Soyla DELENA Merck, MD   "

## 2024-06-05 NOTE — Progress Notes (Signed)
 " PROGRESS NOTE  AHAMED HOFLAND Sr.  FMW:[id number] DOB: 02/25/1946 DOA: 06/03/2024 PCP: Silvano Angeline FALCON, NP   Brief Narrative: Patient is a 79 year old male with history of diabetes type 2, A-fib/a flutter status post ablation/DCCV, HF with improved EF, memory loss who presented with complaint of palpitations, balance issues, left eye drooping.  No focal weakness or numbness on presentation.  On presentation, he was in A-fib with RVR with heart rate in the range of 120s, rhythm was A-fib.  CT chest negative for PE.  No acute findings in MRI.  Patient admitted for the management of A-fib with RVR.  Started on Cardizem  drip.  Cardiology consulted.  Lab work showed elevated BNP.  Plan for DCCV on Monday  Assessment & Plan:  Principal Problem:   Atrial flutter with rapid ventricular response (HCC) Active Problems:   Type 2 diabetes mellitus with complication, without long-term current use of insulin  (HCC)   Acute on chronic heart failure with preserved ejection fraction (HFpEF) (HCC)   Rapid atrial flutter: He is status post ablation/DCCV.  Presented with A-fib.  Started on Cardizem  drip.  Cardiology following.  Cardiology planning for TEE/DCCV during this admission.  Started on Eliquis .  Remains in A-fib rhythm today.  On metoprolol  .  IV Cardizem  on hold  Acute on chronic HFimpEF: Echo on September 2025 showed EF of 50 to 55%, grade 3 diastolic dysfunction.  Given IV Lasix .  Continue Farxiga .  Elevated BNP on presentation.  Given another dose of IV Lasix  on 1/23.  Lungs are mostly clear today, no peripheral edema.  On oral Lasix  now.  Type 2 diabetes: On Farxiga .  Currently on sliding scale  History of hyperlipidemia: Intolerant to statin.  Started on Zetia   History of COPD: History of extensive passive smoking.  Family members smoked around him.  No wheezing today.  Continue bronchodilators as needed.  Concern for stroke/balance issues/left eye droop: There was concern for left facial  weakness, balance issues.  No acute findings as per MRI.  PT consulted  Memory issues: On Namenda , Aricept .  Continue delirium precautions        DVT prophylaxis: apixaban  (ELIQUIS ) tablet 5 mg     Code Status: Full Code  Family Communication: Wife at bedside  Patient status:Inpatient  Patient is from :home  Anticipated discharge un:ynfz  Estimated DC date:1-2 days   Consultants: Cardiology  Procedures:Plan for DCCV  Antimicrobials:  Anti-infectives (From admission, onward)    None       Subjective: Patient seen and examined at bedside today.  Hemodynamically stable.  Remains in A-fib rhythm with controlled heart rate.  Has some cough.  Lungs are clear on auscultation.  No peripheral edema  Objective: Vitals:   06/05/24 0327 06/05/24 0739 06/05/24 0801 06/05/24 0952  BP: 106/68 108/70  114/82  Pulse: 60 (!) 119 (!) 122 (!) 103  Resp: 18 16    Temp: 98 F (36.7 C) 98 F (36.7 C)    TempSrc: Oral Oral    SpO2: 93% 93% 92%   Weight:      Height:        Intake/Output Summary (Last 24 hours) at 06/05/2024 1108 Last data filed at 06/05/2024 0810 Gross per 24 hour  Intake 773.53 ml  Output 1050 ml  Net -276.47 ml   Filed Weights   06/03/24 1251 06/04/24 1156  Weight: 82.6 kg 84.3 kg    Examination:  General exam: Overall comfortable, not in distress HEENT: PERRL Respiratory system:  no wheezes  or crackles  Cardiovascular system: Irregularly irregular rhythm Gastrointestinal system: Abdomen is nondistended, soft and nontender. Central nervous system: Alert and oriented Extremities: No edema, no clubbing ,no cyanosis Skin: No rashes, no ulcers,no icterus     Data Reviewed: I have personally reviewed following labs and imaging studies  CBC: Recent Labs  Lab 06/03/24 1404 06/04/24 0257 06/05/24 0510  WBC 5.4 5.4 7.2  NEUTROABS 3.9  --   --   HGB 11.2* 11.5* 12.0*  HCT 34.6* 37.1* 37.9*  MCV 88.7 90.7 88.8  PLT 146* 153 199   Basic  Metabolic Panel: Recent Labs  Lab 06/03/24 1404 06/04/24 0257 06/05/24 0510  NA 139 136 136  K 4.0 4.2 4.7  CL 103 103 102  CO2 24 22 23   GLUCOSE 121* 263* 158*  BUN 10 12 20   CREATININE 0.89 1.03 1.13  CALCIUM  8.5* 8.8* 8.9  MG 1.8  --   --      Recent Results (from the past 240 hours)  Resp panel by RT-PCR (RSV, Flu A&B, Covid) Anterior Nasal Swab     Status: None   Collection Time: 06/03/24  2:33 PM   Specimen: Anterior Nasal Swab  Result Value Ref Range Status   SARS Coronavirus 2 by RT PCR NEGATIVE NEGATIVE Final   Influenza A by PCR NEGATIVE NEGATIVE Final   Influenza B by PCR NEGATIVE NEGATIVE Final    Comment: (NOTE) The Xpert Xpress SARS-CoV-2/FLU/RSV plus assay is intended as an aid in the diagnosis of influenza from Nasopharyngeal swab specimens and should not be used as a sole basis for treatment. Nasal washings and aspirates are unacceptable for Xpert Xpress SARS-CoV-2/FLU/RSV testing.  Fact Sheet for Patients: bloggercourse.com  Fact Sheet for Healthcare Providers: seriousbroker.it  This test is not yet approved or cleared by the [place]  FDA and has been authorized for detection and/or diagnosis of SARS-CoV-2 by FDA under an Emergency Use Authorization (EUA). This EUA will remain in effect (meaning this test can be used) for the duration of the COVID-19 declaration under Section 564(b)(1) of the Act, 21 U.S.C. section 360bbb-3(b)(1), unless the authorization is terminated or revoked.     Resp Syncytial Virus by PCR NEGATIVE NEGATIVE Final    Comment: (NOTE) Fact Sheet for Patients: bloggercourse.com  Fact Sheet for Healthcare Providers: seriousbroker.it  This test is not yet approved or cleared by the [place]  FDA and has been authorized for detection and/or diagnosis of SARS-CoV-2 by FDA under an Emergency Use Authorization (EUA). This  EUA will remain in effect (meaning this test can be used) for the duration of the COVID-19 declaration under Section 564(b)(1) of the Act, 21 U.S.C. section 360bbb-3(b)(1), unless the authorization is terminated or revoked.  Performed at [facility], [address], [place], [place] [address]      Radiology Studies: MR BRAIN WO CONTRAST Result Date: 06/03/2024 EXAM: MRI BRAIN WITHOUT CONTRAST 06/03/2024 06:58:07 PM TECHNIQUE: Multiplanar multisequence MRI of the head/brain was performed without the administration of intravenous contrast. COMPARISON: Same day CT head and MRI head 11/11/2018. CLINICAL HISTORY: Transient ischemic attack (TIA). FINDINGS: BRAIN AND VENTRICLES: No acute infarct. No intracranial hemorrhage. No mass. No midline shift. No hydrocephalus. The sella is unremarkable. Normal flow voids. There are a few scattered areas of T2 and FLAIR hyperintensity in the periventricular and subcortical white matter compatible with chronic microvascular ischemic changes. There is redemonstration of a focal lesion within the left parietal calvarium near midline. The lesion demonstrates T2 and FLAIR hyperintensity  with T1 hypointensity. There is asymmetric involvement of the inner table of the calvarium. There is thinning of the outer table which is better appreciated on same day CT. The apparent soft tissue seen on CT appears unrelated to the lesion. CSF partially fills the defect along the inner table created by the lesion. There is no involvement of brain parenchyma. The lesion is noted adjacent to the superior sagittal sinus and may reflect a prominent arachnoid granulation. This focus is unchanged since at least 2020 and is favored benign. ORBITS: Bilateral lens replacement. SINUSES AND MASTOIDS: Mucosal thickening in the ethmoid and maxillary sinuses. BONES AND SOFT TISSUES: Normal marrow signal. No soft tissue abnormality. IMPRESSION: 1. Left parietal calvarium lesion is favored to  reflect a prominent arachnoid granulation versus other benign etiology. Finding is unchanged since 2020. 2. No acute intracranial abnormality. Electronically signed by: Donnice Mania MD 06/03/2024 07:30 PM EST RP Workstation: [id number]   CT Angio Chest PE W and/or Wo Contrast Result Date: 06/03/2024 CLINICAL DATA:  Concern for pulmonary embolus EXAM: CT ANGIOGRAPHY CHEST WITH CONTRAST TECHNIQUE: Multidetector CT imaging of the chest was performed using the standard protocol during bolus administration of intravenous contrast. Multiplanar CT image reconstructions and MIPs were obtained to evaluate the vascular anatomy. RADIATION DOSE REDUCTION: This exam was performed according to the departmental dose-optimization program which includes automated exposure control, adjustment of the mA and/or kV according to patient size and/or use of iterative reconstruction technique. CONTRAST:  75mL OMNIPAQUE  IOHEXOL  350 MG/ML SOLN COMPARISON:  Chest CT dated 06/03/2024. FINDINGS: Cardiovascular: Mild cardiomegaly. No pericardial effusion. There is coronary vascular calcification. There is mild atherosclerotic calcification of the thoracic aorta. No aneurysmal dilatation. Mildly dilated main pulmonary trunk suggestive of pulmonary hypertension. Evaluation of the pulmonary arteries is limited due to respiratory motion and streak artifact caused by patient's arms as well as due to suboptimal opacification of the distal branches and timing of the contrast. No central pulmonary artery embolus identified. Mediastinum/Nodes: No hilar adenopathy. Evaluation of the left hilum is limited due to consolidative changes of the left lung. A cluster of lymph node in the subcarinal region measures up to 2 cm in diameter. The esophagus is grossly unremarkable no mediastinal fluid collection. Lungs/Pleura: Postsurgical changes of the left lower lobectomy and left lung base scarring. There is mucus secretion or aspirated content in the left upper  lobe bronchus. Bilateral bronchial wall thickening and peribronchial edema. Patchy area of ground-glass and reticulonodular density in the posterior left upper lobe as well as additional scattered faint ground-glass areas in the right upper lobe may represent bronchopneumonia. There is a small right pleural effusion with partial compressive atelectasis of the right lung base. No pneumothorax. Upper Abdomen: No acute abnormality. Musculoskeletal: Osteopenia with degenerative changes of the spine. No acute osseous pathology. Review of the MIP images confirms the above findings. IMPRESSION: 1. No CT evidence of central pulmonary artery embolus. 2. Postsurgical changes of the left lower lobectomy and left lung base scarring. 3. Mucus secretion or aspirated content in the left upper lobe bronchus. 4. Patchy area of ground-glass and reticulonodular density in the posterior left upper lobe as well as additional scattered faint ground-glass areas in the right upper lobe may represent bronchopneumonia. 5. Small right pleural effusion with partial compressive atelectasis of the right lung base. 6.  Aortic Atherosclerosis (ICD10-I70.0). Electronically Signed   By: Vanetta Chou M.D.   On: 06/03/2024 16:47   CT Head Wo Contrast Result Date: 06/03/2024 EXAM: CT HEAD WITHOUT CONTRAST  06/03/2024 04:30:43 PM TECHNIQUE: CT of the head was performed without the administration of intravenous contrast. Automated exposure control, iterative reconstruction, and/or weight based adjustment of the mA/kV was utilized to reduce the radiation dose to as low as reasonably achievable. COMPARISON: 04/19/2023 CLINICAL HISTORY: Mental status change, unknown cause. FINDINGS: BRAIN AND VENTRICLES: No acute hemorrhage. No evidence of acute infarct. No hydrocephalus. No extra-axial collection. No mass effect or midline shift. Atherosclerosis of the Carotid Siphons and Intracranial Vertebral Arteries. ORBITS: Bilateral lens replacement. SINUSES:  Mucosal thickening throughout the paranasal sinuses most pronounced in the maxillary sinuses. SOFT TISSUES AND SKULL: There is a 1.2 x 1.0 x 2.2 cm irregular lytic lesion in the parietal calvarium left of midline primarily involving the inner table and diploic space with significant thinning of the outer table (series 4, image 25; series 8, image 33). There are likely areas of cortical breakthrough along the outer table. Focal soft tissue thickening in the scalp overlying the lesion (series 8, image 31). This is overall unchanged compared to the 2024 study. Sequelae of left mastoidectomy. IMPRESSION: 1. No acute intracranial abnormality. 2. 1.2 x 1.0 x 2.2 cm irregular lytic lesion in the parietal calvarium left of midline with possible overlying soft tissue thickening. Findings unchanged compared to the 2024 study. Consider nonemergent MRI head with and without contrast for further characterization. Electronically signed by: Donnice Mania MD 06/03/2024 04:39 PM EST RP Workstation: [id number]   DG Chest Portable 1 View Result Date: 06/03/2024 CLINICAL DATA:  Chest tightness. EXAM: PORTABLE CHEST 1 VIEW COMPARISON:  Chest radiograph dated 08/13/2023. FINDINGS: There is cardiomegaly with vascular congestion. Elevation of the left hemidiaphragm with left lung base chronic consolidation which may represent scarring. A small left pleural effusion and left lung base infiltrate is not excluded. Follow-up recommended. Left apical subpleural scarring. No pneumothorax. No acute osseous pathology. IMPRESSION: 1. Cardiomegaly with vascular congestion. 2. Chronic elevation of the left hemidiaphragm with left lung base scarring. A small left pleural effusion and left lung base infiltrate is not excluded. Electronically Signed   By: Vanetta Chou M.D.   On: 06/03/2024 13:46    Scheduled Meds:  apixaban   5 mg Oral BID   dapagliflozin  propanediol  10 mg Oral Daily   donepezil   10 mg Oral QHS   ezetimibe   10 mg Oral Daily    furosemide   20 mg Oral Daily   gabapentin   300 mg Oral QHS   insulin  aspart  0-5 Units Subcutaneous QHS   insulin  aspart  0-6 Units Subcutaneous TID WC   memantine   10 mg Oral BID   metoprolol  tartrate  25 mg Oral BID   sodium chloride  flush  3 mL Intravenous Q12H   traZODone   150 mg Oral QHS   Continuous Infusions:     LOS: 2 days   Ivonne Mustache, MD [facility] P1/24/2026, 11:08 AM  "

## 2024-06-05 NOTE — Progress Notes (Signed)
 Mobility Specialist Progress Note:    06/05/24 1154  Mobility  Activity Ambulated with assistance  Level of Assistance Contact guard assist, steadying assist  Assistive Device None  Distance Ambulated (ft) 500 ft  Activity Response Tolerated well  Mobility Referral Yes  Mobility visit 1 Mobility  Mobility Specialist Start Time (ACUTE ONLY) 1013  Mobility Specialist Stop Time (ACUTE ONLY) 1025  Mobility Specialist Time Calculation (min) (ACUTE ONLY) 12 min   Received pt in bed having no complaints and agreeable to mobility. Ambulated w/ CGA d/t mild unsteadiness. HR stayed within the 120's throughout ambulation but had no complaints. Returned to room w/o fault. Left in bed w/ call bell in reach and all needs met.   Thersia Minder Mobility Specialist  Please contact vis Secure Chat or  Rehab Office [phone]

## 2024-06-05 NOTE — Plan of Care (Signed)
   Problem: Education: Goal: Ability to describe self-care measures that may prevent or decrease complications (Diabetes Survival Skills Education) will improve Outcome: Progressing Goal: Individualized Educational Video(s) Outcome: Progressing   Problem: Coping: Goal: Ability to adjust to condition or change in health will improve Outcome: Progressing   Problem: Fluid Volume: Goal: Ability to maintain a balanced intake and output will improve Outcome: Progressing   Problem: Health Behavior/Discharge Planning: Goal: Ability to identify and utilize available resources and services will improve Outcome: Progressing Goal: Ability to manage health-related needs will improve Outcome: Progressing   Problem: Metabolic: Goal: Ability to maintain appropriate glucose levels will improve Outcome: Progressing   Problem: Nutritional: Goal: Maintenance of adequate nutrition will improve Outcome: Progressing Goal: Progress toward achieving an optimal weight will improve Outcome: Progressing   Problem: Skin Integrity: Goal: Risk for impaired skin integrity will decrease Outcome: Progressing   Problem: Tissue Perfusion: Goal: Adequacy of tissue perfusion will improve Outcome: Progressing   Problem: Education: Goal: Knowledge of General Education information will improve Description: Including pain rating scale, medication(s)/side effects and non-pharmacologic comfort measures Outcome: Progressing   Problem: Health Behavior/Discharge Planning: Goal: Ability to manage health-related needs will improve Outcome: Progressing   Problem: Clinical Measurements: Goal: Ability to maintain clinical measurements within normal limits will improve Outcome: Progressing Goal: Will remain free from infection Outcome: Progressing Goal: Diagnostic test results will improve Outcome: Progressing Goal: Respiratory complications will improve Outcome: Progressing Goal: Cardiovascular complication will  be avoided Outcome: Progressing   Problem: Activity: Goal: Risk for activity intolerance will decrease Outcome: Progressing   Problem: Nutrition: Goal: Adequate nutrition will be maintained Outcome: Progressing   Problem: Coping: Goal: Level of anxiety will decrease Outcome: Progressing   Problem: Elimination: Goal: Will not experience complications related to bowel motility Outcome: Progressing Goal: Will not experience complications related to urinary retention Outcome: Progressing   Problem: Pain Managment: Goal: General experience of comfort will improve and/or be controlled Outcome: Progressing   Problem: Safety: Goal: Ability to remain free from injury will improve Outcome: Progressing   Problem: Skin Integrity: Goal: Risk for impaired skin integrity will decrease Outcome: Progressing   Problem: Education: Goal: Knowledge of disease or condition will improve Outcome: Progressing Goal: Understanding of medication regimen will improve Outcome: Progressing Goal: Individualized Educational Video(s) Outcome: Progressing   Problem: Activity: Goal: Ability to tolerate increased activity will improve Outcome: Progressing   Problem: Cardiac: Goal: Ability to achieve and maintain adequate cardiopulmonary perfusion will improve Outcome: Progressing   Problem: Health Behavior/Discharge Planning: Goal: Ability to safely manage health-related needs after discharge will improve Outcome: Progressing

## 2024-06-05 NOTE — Progress Notes (Signed)
 EKG with regular rhythm without clear atrial activity consistent with junctional rhythm. Will stop diltiazem  and monitor.

## 2024-06-06 DIAGNOSIS — I4892 Unspecified atrial flutter: Secondary | ICD-10-CM | POA: Diagnosis not present

## 2024-06-06 LAB — GLUCOSE, CAPILLARY
Glucose-Capillary: 225 mg/dL — ABNORMAL HIGH (ref 70–99)
Glucose-Capillary: 184 mg/dL — ABNORMAL HIGH (ref 70–99)
Glucose-Capillary: 184 mg/dL — ABNORMAL HIGH (ref 70–99)
Glucose-Capillary: 171 mg/dL — ABNORMAL HIGH (ref 70–99)

## 2024-06-06 LAB — BASIC METABOLIC PANEL WITH GFR
Anion gap: 11 (ref 5–15)
BUN: 24 mg/dL — ABNORMAL HIGH (ref 8–23)
CO2: 25 mmol/L (ref 22–32)
Calcium: 8.4 mg/dL — ABNORMAL LOW (ref 8.9–10.3)
Chloride: 102 mmol/L (ref 98–111)
Creatinine, Ser: 1.11 mg/dL (ref 0.61–1.24)
GFR, Estimated: >60 mL/min
Glucose, Bld: 143 mg/dL — ABNORMAL HIGH (ref 70–99)
Potassium: 4.2 mmol/L (ref 3.5–5.1)
Sodium: 137 mmol/L (ref 135–145)

## 2024-06-06 LAB — CBC
HCT: 38.9 % — ABNORMAL LOW (ref 39.0–52.0)
Hemoglobin: 12.4 g/dL — ABNORMAL LOW (ref 13.0–17.0)
MCH: 28.1 pg (ref 26.0–34.0)
MCHC: 31.9 g/dL (ref 30.0–36.0)
MCV: 88.2 fL (ref 80.0–100.0)
Platelets: 165 10*3/uL (ref 150–400)
RBC: 4.41 MIL/uL (ref 4.22–5.81)
RDW: 14.3 % (ref 11.5–15.5)
WBC: 7 10*3/uL (ref 4.0–10.5)
nRBC: 0 % (ref 0.0–0.2)

## 2024-06-06 NOTE — Progress Notes (Signed)
 "  Progress Note  Patient Name: Herbert GLANCE Sr. Date of Encounter: 06/06/2024 [facility] Cardiologist: [name], DO   Interval Summary   Likely no interval sinus rhythm. EKG from 1/24 am may have been slower atrial fibrillation (vs low p wave voltage with sr and long first deg avb), 1/24 PM EKG clearly afib with RVR.   Vital Signs Vitals:   06/06/24 0021 06/06/24 0041 06/06/24 0407 06/06/24 0719  BP:  110/73 113/77 137/88  Pulse: 87  (!) 104 (!) 121  Resp: 18  18 18   Temp: 97.7 F (36.5 C)  98 F (36.7 C) (!) 97.4 F (36.3 C)  TempSrc: Oral  Oral Oral  SpO2: 92% 92% 93% 95%  Weight:      Height:        Intake/Output Summary (Last 24 hours) at 06/06/2024 1012 Last data filed at 06/05/2024 1221 Gross per 24 hour  Intake 237 ml  Output --  Net 237 ml      06/04/2024   11:56 AM 06/03/2024   12:51 PM 03/16/2024    2:07 PM  Last 3 Weights  Weight (lbs) 185 lb 14.4 oz 182 lb 181 lb  Weight (kg) 84.324 kg 82.555 kg 82.101 kg      Telemetry/ECG   Afib 120s - Personally Reviewed  Physical Exam  GEN: No acute distress.   Neck: JVD Cardiac: iRRR no murmurs Respiratory: Wheezing and coarse rhonchi bilaterally  GI: Soft, nontender, non-distended  MS: No edema  Assessment & Plan  Herbert KENTNER Sr. is a 79 y.o. 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 CXR showed possible evidence of volume overload. CT chest showed pneumonia to RUL and small RT pleural effusion. Head imaging without acute pathology. Troponin 25 -> 22.    Atrial FibrillationFlutter, with  episode of RVR Currently in Afib though I think briefly had SR overnight with long first deg avb. Chad Vas score 5 Keep K > 4 and Mag > 2  Patient referred to EP due to persistent afib on Amiodarone . Afib pulse field ablation completed on 09/11/23. Patient seen in afib clinic on 10/30/23, found to be in atrial flutter. Patient then underwent cardioversion on 11/06/23, converted to sinus bradycardia. At OP visit 03/2024 amiodarone  was stopped.   - Patient missed several DOAC doses. Will need TEE with DCCV, pursue Monday, scheduled. - lopressor  25 mg BID. Originally planned for 50 bid but was hypotensive overnight and has history of bradycardia. If rates increase can either increase BB or resume IV cardizem . IV cardizem  on hold due to slower rates and hypotension. Ok to observe. Continue eliquis  5 mg BID   NSVT PVCs Previously was managed by BB though after converting to sinus bradycardia 10/2023 it was held.  BB as above, may need to re-assess after DCCV as he converted to bradycardia last time.   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  On exam today, looks euvolemic. Cr mild elevation. - not on lasix  at home. Would  start lasix  20 mg po daily here and observe. Continue farxiga  10 mg  If able to continue BB, consolidate at discharge Has not been on MRA could consider starting prior to dc though will hold off to have BP room for HR control With disequilibrium would hold on ARB as well as for BP room  Hyperlipidemia Statin allergy Restart PTA zetia  10 mg  Per primary T2DM Dementia Disequilibrium    Informed Consent   Shared Decision Making/Informed Consent   The risks [stroke, cardiac arrhythmias rarely resulting in the need for a temporary or permanent pacemaker, skin irritation or burns, esophageal damage, perforation  (1:10,000 risk), bleeding, pharyngeal hematoma as well as other potential complications associated with conscious sedation including aspiration, arrhythmia, respiratory failure and death], benefits (treatment guidance, restoration of normal sinus rhythm, diagnostic support) and alternatives of a transesophageal echocardiogram guided cardioversion were discussed in detail with Herbert Evans and he is willing to proceed.       For questions or updates, please contact [facility] Please consult [email or web address] for contact info under       Signed, Soyla DELENA Merck, MD   "

## 2024-06-06 NOTE — TOC CM/SW Note (Addendum)
 Transition of Care [facility]) - Inpatient Brief Assessment   Patient Details  Name: Herbert LOEFFELHOLZ Sr. MRN: [id number] Date of Birth: May 03, 1946  [facility] CM/SW Contact:    Tom-Johnson, Harvest Muskrat, RN Phone Number: 06/06/2024, 12:50 PM   Clinical Narrative:  Patient presented to the ED with Palpitation, Balance Issue, Lt Eye, Drooping. Found to be in A-Fib/RVR. Has hx of A-Fib/Flutter s/p Ablation/DCCV, Memory loss, DM. Started on Cardiazem gtt, plan for DCCV tomorrow 06/07/24, Cardiology following.   No PT f/u, no ICM needs or recommendations noted at this time.  Patient not Medically ready for discharge.  CM will continue to follow as patient progresses with care towards discharge.        Transition of Care Asessment:

## 2024-06-06 NOTE — Plan of Care (Signed)
   Problem: Education: Goal: Ability to describe self-care measures that may prevent or decrease complications (Diabetes Survival Skills Education) will improve Outcome: Progressing Goal: Individualized Educational Video(s) Outcome: Progressing   Problem: Coping: Goal: Ability to adjust to condition or change in health will improve Outcome: Progressing   Problem: Fluid Volume: Goal: Ability to maintain a balanced intake and output will improve Outcome: Progressing   Problem: Health Behavior/Discharge Planning: Goal: Ability to identify and utilize available resources and services will improve Outcome: Progressing Goal: Ability to manage health-related needs will improve Outcome: Progressing   Problem: Metabolic: Goal: Ability to maintain appropriate glucose levels will improve Outcome: Progressing   Problem: Nutritional: Goal: Maintenance of adequate nutrition will improve Outcome: Progressing Goal: Progress toward achieving an optimal weight will improve Outcome: Progressing   Problem: Skin Integrity: Goal: Risk for impaired skin integrity will decrease Outcome: Progressing   Problem: Tissue Perfusion: Goal: Adequacy of tissue perfusion will improve Outcome: Progressing   Problem: Education: Goal: Knowledge of General Education information will improve Description: Including pain rating scale, medication(s)/side effects and non-pharmacologic comfort measures Outcome: Progressing   Problem: Health Behavior/Discharge Planning: Goal: Ability to manage health-related needs will improve Outcome: Progressing   Problem: Clinical Measurements: Goal: Ability to maintain clinical measurements within normal limits will improve Outcome: Progressing Goal: Will remain free from infection Outcome: Progressing Goal: Diagnostic test results will improve Outcome: Progressing Goal: Respiratory complications will improve Outcome: Progressing Goal: Cardiovascular complication will  be avoided Outcome: Progressing   Problem: Activity: Goal: Risk for activity intolerance will decrease Outcome: Progressing   Problem: Nutrition: Goal: Adequate nutrition will be maintained Outcome: Progressing   Problem: Coping: Goal: Level of anxiety will decrease Outcome: Progressing   Problem: Elimination: Goal: Will not experience complications related to bowel motility Outcome: Progressing Goal: Will not experience complications related to urinary retention Outcome: Progressing   Problem: Pain Managment: Goal: General experience of comfort will improve and/or be controlled Outcome: Progressing   Problem: Safety: Goal: Ability to remain free from injury will improve Outcome: Progressing   Problem: Skin Integrity: Goal: Risk for impaired skin integrity will decrease Outcome: Progressing   Problem: Education: Goal: Knowledge of disease or condition will improve Outcome: Progressing Goal: Understanding of medication regimen will improve Outcome: Progressing Goal: Individualized Educational Video(s) Outcome: Progressing   Problem: Activity: Goal: Ability to tolerate increased activity will improve Outcome: Progressing   Problem: Cardiac: Goal: Ability to achieve and maintain adequate cardiopulmonary perfusion will improve Outcome: Progressing   Problem: Health Behavior/Discharge Planning: Goal: Ability to safely manage health-related needs after discharge will improve Outcome: Progressing

## 2024-06-06 NOTE — H&P (View-Only) (Signed)
 "  Progress Note  Patient Name: Herbert GLANCE Sr. Date of Encounter: 06/06/2024 [facility] Cardiologist: [name], DO   Interval Summary   Likely no interval sinus rhythm. EKG from 1/24 am may have been slower atrial fibrillation (vs low p wave voltage with sr and long first deg avb), 1/24 PM EKG clearly afib with RVR.   Vital Signs Vitals:   06/06/24 0021 06/06/24 0041 06/06/24 0407 06/06/24 0719  BP:  110/73 113/77 137/88  Pulse: 87  (!) 104 (!) 121  Resp: 18  18 18   Temp: 97.7 F (36.5 C)  98 F (36.7 C) (!) 97.4 F (36.3 C)  TempSrc: Oral  Oral Oral  SpO2: 92% 92% 93% 95%  Weight:      Height:        Intake/Output Summary (Last 24 hours) at 06/06/2024 1012 Last data filed at 06/05/2024 1221 Gross per 24 hour  Intake 237 ml  Output --  Net 237 ml      06/04/2024   11:56 AM 06/03/2024   12:51 PM 03/16/2024    2:07 PM  Last 3 Weights  Weight (lbs) 185 lb 14.4 oz 182 lb 181 lb  Weight (kg) 84.324 kg 82.555 kg 82.101 kg      Telemetry/ECG   Afib 120s - Personally Reviewed  Physical Exam  GEN: No acute distress.   Neck: JVD Cardiac: iRRR no murmurs Respiratory: Wheezing and coarse rhonchi bilaterally  GI: Soft, nontender, non-distended  MS: No edema  Assessment & Plan  Herbert KENTNER Sr. is a 79 y.o. 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 CXR showed possible evidence of volume overload. CT chest showed pneumonia to RUL and small RT pleural effusion. Head imaging without acute pathology. Troponin 25 -> 22.    Atrial FibrillationFlutter, with  episode of RVR Currently in Afib though I think briefly had SR overnight with long first deg avb. Chad Vas score 5 Keep K > 4 and Mag > 2  Patient referred to EP due to persistent afib on Amiodarone . Afib pulse field ablation completed on 09/11/23. Patient seen in afib clinic on 10/30/23, found to be in atrial flutter. Patient then underwent cardioversion on 11/06/23, converted to sinus bradycardia. At OP visit 03/2024 amiodarone  was stopped.   - Patient missed several DOAC doses. Will need TEE with DCCV, pursue Monday, scheduled. - lopressor  25 mg BID. Originally planned for 50 bid but was hypotensive overnight and has history of bradycardia. If rates increase can either increase BB or resume IV cardizem . IV cardizem  on hold due to slower rates and hypotension. Ok to observe. Continue eliquis  5 mg BID   NSVT PVCs Previously was managed by BB though after converting to sinus bradycardia 10/2023 it was held.  BB as above, may need to re-assess after DCCV as he converted to bradycardia last time.   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  On exam today, looks euvolemic. Cr mild elevation. - not on lasix  at home. Would  start lasix  20 mg po daily here and observe. Continue farxiga  10 mg  If able to continue BB, consolidate at discharge Has not been on MRA could consider starting prior to dc though will hold off to have BP room for HR control With disequilibrium would hold on ARB as well as for BP room  Hyperlipidemia Statin allergy Restart PTA zetia  10 mg  Per primary T2DM Dementia Disequilibrium    Informed Consent   Shared Decision Making/Informed Consent   The risks [stroke, cardiac arrhythmias rarely resulting in the need for a temporary or permanent pacemaker, skin irritation or burns, esophageal damage, perforation  (1:10,000 risk), bleeding, pharyngeal hematoma as well as other potential complications associated with conscious sedation including aspiration, arrhythmia, respiratory failure and death], benefits (treatment guidance, restoration of normal sinus rhythm, diagnostic support) and alternatives of a transesophageal echocardiogram guided cardioversion were discussed in detail with Herbert Evans and he is willing to proceed.       For questions or updates, please contact [facility] Please consult [email or web address] for contact info under       Signed, Soyla DELENA Merck, MD   "

## 2024-06-06 NOTE — Anesthesia Preprocedure Evaluation (Signed)
"                                    Anesthesia Evaluation  Patient identified by MRN, date of birth, ID band Patient awake    Reviewed: Allergy & Precautions, NPO status , Patient's Chart, lab work & pertinent test results  Airway Mallampati: III  TM Distance: >3 FB Neck ROM: Full    Dental  (+) Edentulous Upper, Dental Advisory Given   Pulmonary sleep apnea and Continuous Positive Airway Pressure Ventilation , COPD (breztri PRN, hasnt used since being admitted),  COPD inhaler, former smoker H/o LL lobectomy, chronic cough- per pt has been worsening over the last few months   Pulmonary exam normal breath sounds clear to auscultation       Cardiovascular hypertension (120/80 preop), Pt. on medications +CHF (normal LVEF, grade 3 diastolic dysfunction)  Normal cardiovascular exam+ dysrhythmias (eliquis ) Atrial Fibrillation, Supra Ventricular Tachycardia and Ventricular Tachycardia + Valvular Problems/Murmurs (mild MR) MR  Rhythm:Regular Rate:Normal  01/2024 ECHO: EF 50-55%, low normal LVF, Grade 3 DD, normal RVF, mild MR, mod PI   Neuro/Psych negative neurological ROS  negative psych ROS   GI/Hepatic negative GI ROS, Neg liver ROS,,,H/o colon cancer   Endo/Other  diabetes, Well Controlled, Type 2, Oral Hypoglycemic Agents    Renal/GU negative Renal ROS  negative genitourinary   Musculoskeletal  (+) Arthritis , Osteoarthritis,    Abdominal   Peds  Hematology negative hematology ROS (+) Hb 12.4, plt 165k   Anesthesia Other Findings   Reproductive/Obstetrics negative OB ROS                              Anesthesia Physical Anesthesia Plan  ASA: 3  Anesthesia Plan: MAC   Post-op Pain Management: Minimal or no pain anticipated   Induction:   PONV Risk Score and Plan: 1 and Treatment may vary due to age or medical condition, Propofol  infusion and TIVA  Airway Management Planned: Natural Airway and Simple Face Mask  Additional  Equipment: None  Intra-op Plan:   Post-operative Plan:   Informed Consent: I have reviewed the patients History and Physical, chart, labs and discussed the procedure including the risks, benefits and alternatives for the proposed anesthesia with the patient or authorized representative who has indicated his/her understanding and acceptance.       Plan Discussed with: CRNA  Anesthesia Plan Comments:          Anesthesia Quick Evaluation  "

## 2024-06-06 NOTE — Progress Notes (Signed)
 " PROGRESS NOTE  Herbert CARMEN Sr.  FMW:[id number] DOB: 04/24/1946 DOA: 06/03/2024 PCP: Silvano Angeline FALCON, NP   Brief Narrative: Patient is a 79 year old male with history of diabetes type 2, A-fib/a flutter status post ablation/DCCV, HF with improved EF, memory loss who presented with complaint of palpitations, balance issues, left eye drooping.  No focal weakness or numbness on presentation.  On presentation, he was in A-fib with RVR with heart rate in the range of 120s, rhythm was A-fib.  CT chest negative for PE.  No acute findings in MRI.  Patient admitted for the management of A-fib with RVR.  Started on Cardizem  drip.  Cardiology consulted.  Lab work showed elevated BNP.  Plan for DCCV on Monday  Assessment & Plan:  Principal Problem:   Atrial flutter with rapid ventricular response (HCC) Active Problems:   Type 2 diabetes mellitus with complication, without long-term current use of insulin  (HCC)   Acute on chronic heart failure with preserved ejection fraction (HFpEF) (HCC)   Rapid atrial flutter: He is status post ablation/DCCV.  Presented with A-fib.  Started on Cardizem  drip.  Cardiology following.  Cardiology planning for TEE/DCCV tomorrow.  Started on Eliquis .  Remains in A-fib rhythm today.  On metoprolol  .  IV Cardizem  on hold  Acute on chronic HFimpEF: Echo on September 2025 showed EF of 50 to 55%, grade 3 diastolic dysfunction.  Given IV Lasix .  Continue Farxiga .  Elevated BNP on presentation.  Given another dose of IV Lasix  on 1/23.  Lungs are mostly clear , no peripheral edema.  On oral Lasix  now.  Type 2 diabetes: On Farxiga .  Currently on sliding scale  History of hyperlipidemia: Intolerant to statin.  Started on Zetia   History of COPD: History of extensive passive smoking.  Family members smoked around him.  No wheezing today.  Continue bronchodilators as needed.  Once on room air  Concern for stroke/balance issues/left eye droop: There was concern for left facial  weakness, balance issues.  No acute findings as per MRI.  PT consulted.  No follow-up recommended  Memory issues: On Namenda , Aricept .  Continue delirium precautions        DVT prophylaxis: apixaban  (ELIQUIS ) tablet 5 mg     Code Status: Full Code  Family Communication: Wife at bedside on 1/25  Patient status:Inpatient  Patient is from :home  Anticipated discharge un:ynfz  Estimated DC date:1-2 days   Consultants: Cardiology  Procedures:Plan for DCCV  Antimicrobials:  Anti-infectives (From admission, onward)    None       Subjective: Patient seen and examined at bedside today.  He was ambulating to the bathroom.  Heart rate on ambulation was in the range of 120s.  No chest pain or shortness of breath.  Remains on room air.  Plan for DCCV/cardioversion tomorrow  Objective: Vitals:   06/06/24 0021 06/06/24 0041 06/06/24 0407 06/06/24 0719  BP:  110/73 113/77 137/88  Pulse: 87  (!) 104 (!) 121  Resp: 18  18 18   Temp: 97.7 F (36.5 C)  98 F (36.7 C) (!) 97.4 F (36.3 C)  TempSrc: Oral  Oral Oral  SpO2: 92% 92% 93% 95%  Weight:      Height:        Intake/Output Summary (Last 24 hours) at 06/06/2024 1053 Last data filed at 06/05/2024 1221 Gross per 24 hour  Intake 237 ml  Output --  Net 237 ml   Filed Weights   06/03/24 1251 06/04/24 1156  Weight: 82.6 kg  84.3 kg    Examination:  General exam: Overall comfortable, not in distress HEENT: PERRL Respiratory system:  no wheezes or crackles  Cardiovascular system: Irregularly  irregular rhythm Gastrointestinal system: Abdomen is nondistended, soft and nontender. Central nervous system: Alert and oriented Extremities: No edema, no clubbing ,no cyanosis Skin: No rashes, no ulcers,no icterus     Data Reviewed: I have personally reviewed following labs and imaging studies  CBC: Recent Labs  Lab 06/03/24 1404 06/04/24 0257 06/05/24 0510 06/06/24 0304  WBC 5.4 5.4 7.2 7.0  NEUTROABS 3.9  --   --    --   HGB 11.2* 11.5* 12.0* 12.4*  HCT 34.6* 37.1* 37.9* 38.9*  MCV 88.7 90.7 88.8 88.2  PLT 146* 153 199 165   Basic Metabolic Panel: Recent Labs  Lab 06/03/24 1404 06/04/24 0257 06/05/24 0510 06/06/24 0304  NA 139 136 136 137  K 4.0 4.2 4.7 4.2  CL 103 103 102 102  CO2 24 22 23 25   GLUCOSE 121* 263* 158* 143*  BUN 10 12 20  24*  CREATININE 0.89 1.03 1.13 1.11  CALCIUM  8.5* 8.8* 8.9 8.4*  MG 1.8  --   --   --      Recent Results (from the past 240 hours)  Resp panel by RT-PCR (RSV, Flu A&B, Covid) Anterior Nasal Swab     Status: None   Collection Time: 06/03/24  2:33 PM   Specimen: Anterior Nasal Swab  Result Value Ref Range Status   SARS Coronavirus 2 by RT PCR NEGATIVE NEGATIVE Final   Influenza A by PCR NEGATIVE NEGATIVE Final   Influenza B by PCR NEGATIVE NEGATIVE Final    Comment: (NOTE) The Xpert Xpress SARS-CoV-2/FLU/RSV plus assay is intended as an aid in the diagnosis of influenza from Nasopharyngeal swab specimens and should not be used as a sole basis for treatment. Nasal washings and aspirates are unacceptable for Xpert Xpress SARS-CoV-2/FLU/RSV testing.  Fact Sheet for Patients: bloggercourse.com  Fact Sheet for Healthcare Providers: seriousbroker.it  This test is not yet approved or cleared by the [place]  FDA and has been authorized for detection and/or diagnosis of SARS-CoV-2 by FDA under an Emergency Use Authorization (EUA). This EUA will remain in effect (meaning this test can be used) for the duration of the COVID-19 declaration under Section 564(b)(1) of the Act, 21 U.S.C. section 360bbb-3(b)(1), unless the authorization is terminated or revoked.     Resp Syncytial Virus by PCR NEGATIVE NEGATIVE Final    Comment: (NOTE) Fact Sheet for Patients: bloggercourse.com  Fact Sheet for Healthcare Providers: seriousbroker.it  This test is not  yet approved or cleared by the [place]  FDA and has been authorized for detection and/or diagnosis of SARS-CoV-2 by FDA under an Emergency Use Authorization (EUA). This EUA will remain in effect (meaning this test can be used) for the duration of the COVID-19 declaration under Section 564(b)(1) of the Act, 21 U.S.C. section 360bbb-3(b)(1), unless the authorization is terminated or revoked.  Performed at [facility], [address] [place], [place] [address]      Radiology Studies: No results found.   Scheduled Meds:  apixaban   5 mg Oral BID   dapagliflozin  propanediol  10 mg Oral Daily   donepezil   10 mg Oral QHS   escitalopram   20 mg Oral Daily   ezetimibe   10 mg Oral Daily   furosemide   20 mg Oral Daily   gabapentin   300 mg Oral QHS   guaiFENesin   600 mg  Oral BID   insulin  aspart  0-5 Units Subcutaneous QHS   insulin  aspart  0-6 Units Subcutaneous TID WC   memantine   10 mg Oral BID   metoprolol  tartrate  25 mg Oral BID   sodium chloride  flush  3 mL Intravenous Q12H   traZODone   150 mg Oral QHS   Continuous Infusions:     LOS: 3 days   Herbert Mustache, MD [facility] P1/25/2026, 10:53 AM  "

## 2024-06-07 ENCOUNTER — Encounter (HOSPITAL_COMMUNITY): Admitting: Anesthesiology

## 2024-06-07 ENCOUNTER — Inpatient Hospital Stay (HOSPITAL_COMMUNITY)

## 2024-06-07 ENCOUNTER — Encounter (HOSPITAL_COMMUNITY): Admission: EM | Disposition: A | Payer: Self-pay | Source: Home / Self Care | Attending: Internal Medicine

## 2024-06-07 DIAGNOSIS — I4891 Unspecified atrial fibrillation: Secondary | ICD-10-CM | POA: Diagnosis not present

## 2024-06-07 DIAGNOSIS — Z87891 Personal history of nicotine dependence: Secondary | ICD-10-CM | POA: Diagnosis not present

## 2024-06-07 DIAGNOSIS — I5020 Unspecified systolic (congestive) heart failure: Secondary | ICD-10-CM

## 2024-06-07 DIAGNOSIS — I4892 Unspecified atrial flutter: Secondary | ICD-10-CM | POA: Diagnosis not present

## 2024-06-07 DIAGNOSIS — I110 Hypertensive heart disease with heart failure: Secondary | ICD-10-CM | POA: Diagnosis not present

## 2024-06-07 DIAGNOSIS — I5033 Acute on chronic diastolic (congestive) heart failure: Secondary | ICD-10-CM | POA: Diagnosis not present

## 2024-06-07 LAB — BASIC METABOLIC PANEL WITH GFR
Anion gap: 11 (ref 5–15)
BUN: 28 mg/dL — ABNORMAL HIGH (ref 8–23)
CO2: 27 mmol/L (ref 22–32)
Calcium: 8.7 mg/dL — ABNORMAL LOW (ref 8.9–10.3)
Chloride: 100 mmol/L (ref 98–111)
Creatinine, Ser: 1.15 mg/dL (ref 0.61–1.24)
GFR, Estimated: >60 mL/min
Glucose, Bld: 199 mg/dL — ABNORMAL HIGH (ref 70–99)
Potassium: 4.2 mmol/L (ref 3.5–5.1)
Sodium: 137 mmol/L (ref 135–145)

## 2024-06-07 LAB — GLUCOSE, CAPILLARY
Glucose-Capillary: 135 mg/dL — ABNORMAL HIGH (ref 70–99)
Glucose-Capillary: 138 mg/dL — ABNORMAL HIGH (ref 70–99)
Glucose-Capillary: 223 mg/dL — ABNORMAL HIGH (ref 70–99)
Glucose-Capillary: 220 mg/dL — ABNORMAL HIGH (ref 70–99)
Glucose-Capillary: 179 mg/dL — ABNORMAL HIGH (ref 70–99)

## 2024-06-07 LAB — CBC
HCT: 38.4 % — ABNORMAL LOW (ref 39.0–52.0)
Hemoglobin: 12.6 g/dL — ABNORMAL LOW (ref 13.0–17.0)
MCH: 28.8 pg (ref 26.0–34.0)
MCHC: 32.8 g/dL (ref 30.0–36.0)
MCV: 87.7 fL (ref 80.0–100.0)
Platelets: 198 10*3/uL (ref 150–400)
RBC: 4.38 MIL/uL (ref 4.22–5.81)
RDW: 14.2 % (ref 11.5–15.5)
WBC: 6.9 10*3/uL (ref 4.0–10.5)
nRBC: 0 % (ref 0.0–0.2)

## 2024-06-07 LAB — ECHO TEE

## 2024-06-07 LAB — LACTIC ACID, PLASMA: Lactic Acid, Venous: 1.5 mmol/L (ref 0.5–1.9)

## 2024-06-07 MED ORDER — AMIODARONE LOAD VIA INFUSION
150.0000 mg | Freq: Once | INTRAVENOUS | Status: AC
  Administered 2024-06-07: 150 mg via INTRAVENOUS
  Filled 2024-06-07: qty 83.34

## 2024-06-07 MED ORDER — PROPOFOL 10 MG/ML IV BOLUS
INTRAVENOUS | Status: DC | PRN
  Administered 2024-06-07: 60 mg via INTRAVENOUS
  Administered 2024-06-07: 120 ug/kg/min via INTRAVENOUS
  Administered 2024-06-07: 20 mg via INTRAVENOUS

## 2024-06-07 MED ORDER — PHENYLEPHRINE HCL-NACL 20-0.9 MG/250ML-% IV SOLN: INTRAVENOUS | Status: DC | PRN

## 2024-06-07 MED ORDER — PHENYLEPHRINE 80 MCG/ML (10ML) SYRINGE FOR IV PUSH (FOR BLOOD PRESSURE SUPPORT)
PREFILLED_SYRINGE | INTRAVENOUS | Status: DC | PRN
  Administered 2024-06-07 (×2): 120 ug via INTRAVENOUS

## 2024-06-07 MED ORDER — AMIODARONE HCL IN DEXTROSE 360-4.14 MG/200ML-% IV SOLN
30.0000 mg/h | INTRAVENOUS | Status: DC
  Administered 2024-06-08: 30 mg/h via INTRAVENOUS
  Filled 2024-06-07 (×3): qty 200

## 2024-06-07 MED ORDER — SODIUM CHLORIDE 0.9 % IV SOLN: INTRAVENOUS | Status: DC | PRN

## 2024-06-07 MED ORDER — EPHEDRINE SULFATE-NACL 50-0.9 MG/10ML-% IV SOSY
PREFILLED_SYRINGE | INTRAVENOUS | Status: DC | PRN
  Administered 2024-06-07: 10 mg via INTRAVENOUS

## 2024-06-07 MED ORDER — AMIODARONE HCL IN DEXTROSE 360-4.14 MG/200ML-% IV SOLN
60.0000 mg/h | INTRAVENOUS | Status: DC
  Administered 2024-06-07 (×2): 60 mg/h via INTRAVENOUS
  Filled 2024-06-07: qty 200

## 2024-06-07 NOTE — Progress Notes (Signed)
 Called to bedside by patient and family; new complaint of cold sweats and pallor.  Obtained vital signs and CBG; nothing significantly abnormal, however patient feeling general malaise.  Recent TEE/DCCV and maintaining NSR post.  Discussed with unit director, Josette Mose who recommended contacting rapid response RN for evaluation and recommendations.  S/W Hella, RN for rapid response and she responded to floor with further recommendation to notify MD given vague symptoms and fairly normal vital signs.  Hao Meng, [place] for cardiology and Dr. Jillian for hospitalist group made aware.  Vitals taken at time of patient call:   06/07/24 1440  Vitals  Temp 98 F (36.7 C)  Temp Source Oral  BP 113/66  MAP (mmHg) 80  BP Location Left Arm  BP Method Automatic  Patient Position (if appropriate) Lying  Pulse Rate 66  Pulse Rate Source Monitor  ECG Heart Rate 66  Resp 18  Level of Consciousness  Level of Consciousness Alert  MEWS COLOR  MEWS Score Color Green  Oxygen Therapy  SpO2 94 %  O2 Device Room Air  Pain Assessment  Pain Scale 0-10  Pain Score 0  Complaints & Interventions  Complains of Other (Comment) (Cold sweats, pallor)  Interventions Other (comment) (checked CBG and vital signs)  MEWS Score  MEWS Temp 0  MEWS Systolic 0  MEWS Pulse 0  MEWS RR 0  MEWS LOC 0  MEWS Score 0   Continuing to monitor closely.

## 2024-06-07 NOTE — Interval H&P Note (Signed)
 History and Physical Interval Note:  06/07/2024 7:55 AM  Herbert FORBES Frost Sr.  has presented today for surgery, with the diagnosis of afib.  The various methods of treatment have been discussed with the patient and family. After consideration of risks, benefits and other options for treatment, the patient has consented to  Procedures: TRANSESOPHAGEAL ECHOCARDIOGRAM (N/A) CARDIOVERSION (N/A) as a surgical intervention.  The patient's history has been reviewed, patient examined, no change in status, stable for surgery.  I have reviewed the patient's chart and labs.  Questions were answered to the patient's satisfaction.     Serjio Deupree H Azobou Tonleu

## 2024-06-07 NOTE — Progress Notes (Signed)
 Noted tachycardia on telemetry and responded to patient's bedside.  Patient was not experiencing any symptoms with rates sustained circa 120 bpm.  Multiple family members at bedside showing concern and support.  Notified providers by secure message of changes and received new orders.  Atrial fibrillation with RVR confirmed by EKG.  Will start IV amiodarone  per order when available from pharmacy.  Continuing to monitor closely.

## 2024-06-07 NOTE — Anesthesia Postprocedure Evaluation (Signed)
"   Anesthesia Post Note  Patient: Herbert CRASS Sr.  Procedure(s) Performed: TRANSESOPHAGEAL ECHOCARDIOGRAM CARDIOVERSION     Patient location during evaluation: PACU Anesthesia Type: MAC Level of consciousness: awake and alert Pain management: pain level controlled Vital Signs Assessment: post-procedure vital signs reviewed and stable Respiratory status: spontaneous breathing, nonlabored ventilation and respiratory function stable Cardiovascular status: blood pressure returned to baseline and stable Postop Assessment: no apparent nausea or vomiting Anesthetic complications: no   There were no known notable events for this encounter.  Last Vitals:  Vitals:   06/07/24 0815 06/07/24 0945  BP: 120/80 115/74  Pulse: (!) 119 64  Resp:  19  Temp:    SpO2: 96% 92%    Last Pain:  Vitals:   06/07/24 0815  TempSrc:   PainSc: 0-No pain                 [name]      "

## 2024-06-07 NOTE — TOC Initial Note (Addendum)
 Transition of Care (TOC) - Initial/Assessment Note    Patient Details  Name: Herbert MONRREAL Sr. MRN: [id number] Date of Birth: Jan 23, 1946  Transition of [facility]) CM/SW Contact:    Sudie Erminio Deems, RN Phone Number: 06/07/2024, 11:31 AM  Clinical Narrative:  Patient presented for palpitations-post TDD DCCV. PTA patient was from home with spouse and his granddaughter manages medications. Patient has DME nebulizer in the home; however, spouse states it has not been working since summer. Spouse thinks the agency is [employer]. ICM did reach out to the Liaison to see if could assist- Awaiting call back. No further disposition needs identified at this time.                  1406 06-07-24 ICM did call [employer] and patient is only active for DME rolling walker. ICM did call [employer] to see if active. Awaiting call back. MD has placed PT/OT consult.   1629 [employer] will reach out to the patient regarding delivery of nebulizer machine. No further needs identified.   Expected Discharge Plan: Home/Self Care Barriers to Discharge: No Barriers Identified   Patient Goals and CMS Choice Patient states their goals for this hospitalization and ongoing recovery are:: plan to return home once stable          Expected Discharge Plan and Services   Discharge Planning Services: CM Consult Post Acute Care Choice: NA Living arrangements for the past 2 months: Single Family Home                   DME Agency: NA       HH Arranged: NA          Prior Living Arrangements/Services Living arrangements for the past 2 months: Single Family Home Lives with:: Spouse Patient language and need for interpreter reviewed:: Yes Do you feel safe going back to the place where you live?: Yes      Need for Family Participation in Patient Care: Yes (Comment) Care giver support system in place?: Yes (comment) Current home services: DME (nebulizer machine) Criminal Activity/Legal Involvement Pertinent to Current  Situation/Hospitalization: No - Comment as needed  Activities of Daily Living   ADL Screening (condition at time of admission) Independently performs ADLs?: Yes (appropriate for developmental age) Is the patient deaf or have difficulty hearing?: No Does the patient have difficulty seeing, even when wearing glasses/contacts?: No Does the patient have difficulty concentrating, remembering, or making decisions?: Yes  Permission Sought/Granted Permission sought to share information with : Case Manager, Family Supports, Oceanographer granted to share information with : Yes, Verbal Permission Granted     Permission granted to share info w AGENCY: [employer]        Emotional Assessment   Attitude/Demeanor/Rapport: Engaged Affect (typically observed): Apprehensive Orientation: : Oriented to Self, Oriented to Place, Oriented to  Time, Oriented to Situation Alcohol  / Substance Use: Not Applicable Psych Involvement: No (comment)  Admission diagnosis:  Facial weakness [R29.810] Atrial flutter with rapid ventricular response (HCC) [I48.92] Atrial flutter, unspecified type [facility]) [I48.92] Patient Active Problem List   Diagnosis Date Noted   Atrial flutter with rapid ventricular response (HCC) 06/03/2024   Acute on chronic heart failure with preserved ejection fraction (HFpEF) (HCC) 06/03/2024   Persistent atrial fibrillation (HCC) 11/06/2023   Long term (current) use of anticoagulants 01/29/2023   PAF (paroxysmal atrial fibrillation) (HCC) 03/04/2022   Depressed left ventricular ejection fraction 03/04/2022   Atrial fibrillation with RVR (HCC) 01/22/2022   Atypical  atrial flutter (HCC)    Atrial fibrillation with rapid ventricular response (HCC) 01/21/2022   COVID 01/21/2022   Nonrheumatic mitral valve regurgitation 12/13/2020   NSVT (nonsustained ventricular tachycardia) (HCC) 12/13/2020   Occasional PVCs 12/13/2020   Palpitations 11/15/2020   Arthritis  11/08/2020   Chronic cough 11/08/2020   DOE (dyspnea on exertion) 11/08/2020   History of colon cancer 11/08/2020   History of Helicobacter pylori infection 11/08/2020   History of kidney stones 11/08/2020   History of pneumonectomy 11/08/2020   Hypertension 11/08/2020   Paroxysmal ventricular tachycardia (HCC) 11/08/2020   RBBB (right bundle branch block) 11/08/2020   Right ureteral stone 11/08/2020   Wears dentures 11/08/2020   Community acquired pneumonia 10/13/2020   Hyperlipidemia LDL goal <100 01/10/2020   Medication management 08/30/2019   Type 2 diabetes mellitus with complication, without long-term current use of insulin  (HCC) 08/30/2019   Poor historian 03/30/2019   Noncompliance with medication regimen 03/30/2019   Tobacco abuse 09/29/2018   Statin intolerance 09/29/2018   Long-term use of aspirin  therapy 09/29/2018   PCP:  Silvano Angeline FALCON, NP Pharmacy:   [facility] [address], [place] - [address] [facility] [place] [address] Phone: [phone] Fax: [phone]     Social Drivers of Health (SDOH) Social History: SDOH Screenings   Food Insecurity: No Food Insecurity (06/04/2024)  Housing: Low Risk (06/04/2024)  Transportation Needs: No Transportation Needs (06/04/2024)  Utilities: Not At Risk (06/04/2024)  Social Connections: Moderately Isolated (06/04/2024)  Tobacco Use: High Risk (06/03/2024)   SDOH Interventions: Transportation Interventions: Inpatient TOC   Readmission Risk Interventions    01/23/2022    4:28 PM  Readmission Risk Prevention Plan  Post Dischage Appt Complete  Medication Screening Complete

## 2024-06-07 NOTE — Plan of Care (Signed)
" °  Problem: Education: Goal: Ability to describe self-care measures that may prevent or decrease complications (Diabetes Survival Skills Education) will improve Outcome: Progressing Goal: Individualized Educational Video(s) Outcome: Progressing   Problem: Coping: Goal: Ability to adjust to condition or change in health will improve Outcome: Progressing   Problem: Health Behavior/Discharge Planning: Goal: Ability to identify and utilize available resources and services will improve Outcome: Progressing Goal: Ability to manage health-related needs will improve Outcome: Progressing   Problem: Metabolic: Goal: Ability to maintain appropriate glucose levels will improve Outcome: Progressing   Problem: Nutritional: Goal: Maintenance of adequate nutrition will improve Outcome: Progressing Goal: Progress toward achieving an optimal weight will improve Outcome: Progressing   Problem: Skin Integrity: Goal: Risk for impaired skin integrity will decrease Outcome: Progressing   Problem: Tissue Perfusion: Goal: Adequacy of tissue perfusion will improve Outcome: Progressing   Problem: Education: Goal: Knowledge of General Education information will improve Description: Including pain rating scale, medication(s)/side effects and non-pharmacologic comfort measures Outcome: Progressing   Problem: Health Behavior/Discharge Planning: Goal: Ability to manage health-related needs will improve Outcome: Progressing   Problem: Clinical Measurements: Goal: Ability to maintain clinical measurements within normal limits will improve Outcome: Progressing Goal: Will remain free from infection Outcome: Progressing Goal: Diagnostic test results will improve Outcome: Progressing Goal: Respiratory complications will improve Outcome: Progressing Goal: Cardiovascular complication will be avoided Outcome: Progressing   Problem: Activity: Goal: Risk for activity intolerance will decrease Outcome:  Progressing   Problem: Nutrition: Goal: Adequate nutrition will be maintained Outcome: Progressing   Problem: Coping: Goal: Level of anxiety will decrease Outcome: Progressing   Problem: Elimination: Goal: Will not experience complications related to bowel motility Outcome: Progressing Goal: Will not experience complications related to urinary retention Outcome: Progressing   Problem: Pain Managment: Goal: General experience of comfort will improve and/or be controlled Outcome: Progressing   Problem: Safety: Goal: Ability to remain free from injury will improve Outcome: Progressing   Problem: Skin Integrity: Goal: Risk for impaired skin integrity will decrease Outcome: Progressing   Problem: Education: Goal: Knowledge of disease or condition will improve Outcome: Progressing Goal: Understanding of medication regimen will improve Outcome: Progressing Goal: Individualized Educational Video(s) Outcome: Progressing   Problem: Activity: Goal: Ability to tolerate increased activity will improve Outcome: Progressing   Problem: Cardiac: Goal: Ability to achieve and maintain adequate cardiopulmonary perfusion will improve Outcome: Progressing   Problem: Health Behavior/Discharge Planning: Goal: Ability to safely manage health-related needs after discharge will improve Outcome: Progressing   "

## 2024-06-07 NOTE — Progress Notes (Signed)
 " PROGRESS NOTE  Herbert KATS Sr.  FMW:[id number] DOB: 1945/07/18 DOA: 06/03/2024 PCP: Silvano Angeline FALCON, NP   Brief Narrative: Patient is a 79 year old male with history of diabetes type 2, A-fib/a flutter status post ablation/DCCV, HF with improved EF, memory loss who presented with complaint of palpitations, balance issues, left eye drooping.  No focal weakness or numbness on presentation.  On presentation, he was in A-fib with RVR with heart rate in the range of 120s, rhythm was A-fib.  CT chest negative for PE.  No acute findings in MRI.  Patient admitted for the management of A-fib with RVR.  Started on Cardizem  drip.  Cardiology consulted.  Lab work showed elevated BNP.  S/P DCCV, currently in normal sinus rhythm.  Plan for discharge tomorrow to home.  PT consulted  Assessment & Plan:  Principal Problem:   Atrial flutter with rapid ventricular response (HCC) Active Problems:   Type 2 diabetes mellitus with complication, without long-term current use of insulin  (HCC)   Acute on chronic heart failure with preserved ejection fraction (HFpEF) (HCC)   Rapid atrial flutter:  Presented with A-fib.  Started on Cardizem  drip.  Cardiology following.  S/O TEE/DCCV . Started on Eliquis .  On normal sinus rhythm this morning.  Continue metoprolol   Acute on chronic HFimpEF: Echo on September 2025 showed EF of 50 to 55%, grade 3 diastolic dysfunction.  Given IV Lasix .  Continue Farxiga .  Elevated BNP on presentation.  Given another dose of IV Lasix  on 1/23.  Lungs are mostly clear , no peripheral edema.  On oral Lasix  now.  Type 2 diabetes: On Farxiga .  Currently on sliding scale  History of hyperlipidemia: Intolerant to statin.  Started on Zetia   History of COPD: History of extensive passive smoking.  Family members smoked around him.  No wheezing today.  Continue bronchodilators as needed.  Once on room air.  Needs nebulizer machine at home  Concern for stroke/balance issues/left eye droop: There  was concern for left facial weakness, balance issues.  No acute findings as per MRI.  PT consulted.  No follow-up recommended.Reconsulted PT  Memory issues: On Namenda , Aricept .  Continue delirium precautions        DVT prophylaxis: apixaban  (ELIQUIS ) tablet 5 mg     Code Status: Full Code  Family Communication: Wife at bedside on 1/26  Patient status:Inpatient  Patient is from :home  Anticipated discharge un:ynfz  Estimated DC date:tomorrow   Consultants: Cardiology  Procedures:Plan for DCCV  Antimicrobials:  Anti-infectives (From admission, onward)    None       Subjective: Patient seen and examined at bedside today.  Came from DCCV.  Normal sinus rhythm. Feels weak today.  PT consult planned.  We discussed about discharging home tomorrow if he feels comfortable   Objective: Vitals:   06/07/24 0335 06/07/24 0742 06/07/24 0815 06/07/24 0945  BP: 102/66 101/62 120/80 115/74  Pulse: (!) 117 (!) 121 (!) 119 64  Resp: 18 18  19   Temp: 97.7 F (36.5 C) 98 F (36.7 C)    TempSrc: Oral Oral    SpO2: 93% 93% 96% 92%  Weight:      Height:        Intake/Output Summary (Last 24 hours) at 06/07/2024 1136 Last data filed at 06/06/2024 1700 Gross per 24 hour  Intake 240 ml  Output --  Net 240 ml   Filed Weights   06/03/24 1251 06/04/24 1156  Weight: 82.6 kg 84.3 kg    Examination:  General exam: Overall comfortable, not in distress HEENT: PERRL Respiratory system:  no wheezes or crackles  Cardiovascular system: S1 & S2 heard, RRR.  Gastrointestinal system: Abdomen is nondistended, soft and nontender. Central nervous system: Alert and oriented Extremities: No edema, no clubbing ,no cyanosis Skin: No rashes, no ulcers,no icterus     Data Reviewed: I have personally reviewed following labs and imaging studies  CBC: Recent Labs  Lab 06/03/24 1404 06/04/24 0257 06/05/24 0510 06/06/24 0304  WBC 5.4 5.4 7.2 7.0  NEUTROABS 3.9  --   --   --   HGB  11.2* 11.5* 12.0* 12.4*  HCT 34.6* 37.1* 37.9* 38.9*  MCV 88.7 90.7 88.8 88.2  PLT 146* 153 199 165   Basic Metabolic Panel: Recent Labs  Lab 06/03/24 1404 06/04/24 0257 06/05/24 0510 06/06/24 0304  NA 139 136 136 137  K 4.0 4.2 4.7 4.2  CL 103 103 102 102  CO2 24 22 23 25   GLUCOSE 121* 263* 158* 143*  BUN 10 12 20  24*  CREATININE 0.89 1.03 1.13 1.11  CALCIUM  8.5* 8.8* 8.9 8.4*  MG 1.8  --   --   --      Recent Results (from the past 240 hours)  Resp panel by RT-PCR (RSV, Flu A&B, Covid) Anterior Nasal Swab     Status: None   Collection Time: 06/03/24  2:33 PM   Specimen: Anterior Nasal Swab  Result Value Ref Range Status   SARS Coronavirus 2 by RT PCR NEGATIVE NEGATIVE Final   Influenza A by PCR NEGATIVE NEGATIVE Final   Influenza B by PCR NEGATIVE NEGATIVE Final    Comment: (NOTE) The Xpert Xpress SARS-CoV-2/FLU/RSV plus assay is intended as an aid in the diagnosis of influenza from Nasopharyngeal swab specimens and should not be used as a sole basis for treatment. Nasal washings and aspirates are unacceptable for Xpert Xpress SARS-CoV-2/FLU/RSV testing.  Fact Sheet for Patients: bloggercourse.com  Fact Sheet for Healthcare Providers: seriousbroker.it  This test is not yet approved or cleared by the [place]  FDA and has been authorized for detection and/or diagnosis of SARS-CoV-2 by FDA under an Emergency Use Authorization (EUA). This EUA will remain in effect (meaning this test can be used) for the duration of the COVID-19 declaration under Section 564(b)(1) of the Act, 21 U.S.C. section 360bbb-3(b)(1), unless the authorization is terminated or revoked.     Resp Syncytial Virus by PCR NEGATIVE NEGATIVE Final    Comment: (NOTE) Fact Sheet for Patients: bloggercourse.com  Fact Sheet for Healthcare Providers: seriousbroker.it  This test is not yet  approved or cleared by the [place]  FDA and has been authorized for detection and/or diagnosis of SARS-CoV-2 by FDA under an Emergency Use Authorization (EUA). This EUA will remain in effect (meaning this test can be used) for the duration of the COVID-19 declaration under Section 564(b)(1) of the Act, 21 U.S.C. section 360bbb-3(b)(1), unless the authorization is terminated or revoked.  Performed at [facility], [address], [place], [place] [address]      Radiology Studies: EP STUDY Result Date: 06/07/2024 See surgical note for result.    Scheduled Meds:  apixaban   5 mg Oral BID   dapagliflozin  propanediol  10 mg Oral Daily   donepezil   10 mg Oral QHS   escitalopram   20 mg Oral Daily   ezetimibe   10 mg Oral Daily   furosemide   20 mg Oral Daily   gabapentin   300 mg Oral QHS   guaiFENesin   600 mg Oral BID   insulin  aspart  0-5 Units Subcutaneous QHS   insulin  aspart  0-6 Units Subcutaneous TID WC   memantine   10 mg Oral BID   metoprolol  tartrate  25 mg Oral BID   sodium chloride  flush  3 mL Intravenous Q12H   traZODone   150 mg Oral QHS   Continuous Infusions:     LOS: 4 days   Ivonne Mustache, MD [facility] P1/26/2026, 11:36 AM  "

## 2024-06-07 NOTE — CV Procedure (Signed)
" ° °  TRANSESOPHAGEAL ECHOCARDIOGRAM GUIDED DIRECT CURRENT CARDIOVERSION  NAME:  Herbert HECKARD Sr.    MRN: [id number] DOB:  10/26/1945    ADMIT DATE: 06/03/2024  INDICATIONS: Symptomatic atrial fibrillation  PROCEDURE:  Informed consent was obtained prior to the procedure. The risks, benefits and alternatives for the procedure were discussed and the patient comprehended these risks.  Risks include, but are not limited to, cough, sore throat, vomiting, nausea, somnolence, esophageal and stomach trauma or perforation, bleeding, low blood pressure, aspiration, pneumonia, infection, trauma to the teeth and death.   After a procedural time-out, the oropharynx was anesthetized and the patient was sedated by the anesthesia service. The transesophageal probe was inserted in the esophagus and stomach without difficulty and multiple views were obtained. Anesthesia was monitored by Dr. Merla.   COMPLICATIONS:   Complications: No complications Patient tolerated procedure well.  KEY FINDINGS: - No LAA clot - Mild to moderate TR - Full Report to follow.   CARDIOVERSION:    Indications:  Symptomatic Atrial Fibrillation  Procedure Details: Once the TEE was complete, the patient had the defibrillator pads placed in the anterior and posterior position. Once an appropriate level of sedation was confirmed, the patient was cardioverted x 1 with 200J of biphasic synchronized energy.  The patient converted to NSR.  There were no apparent complications.  The patient had normal neuro status and respiratory status post procedure with vitals stable as recorded elsewhere.  Adequate airway was maintained throughout and vital signs monitored per protocol.  Signed, Joelle HUNT Ren Ny, MD, [facility]  [facility]  9:16 AM  "

## 2024-06-07 NOTE — Progress Notes (Addendum)
 "  Rounding Note   Patient Name: Herbert DEVINCENT Sr. Date of Encounter: 06/07/2024  [facility] Cardiologist: [name], DO   Subjective  Feeling ok after TEE DCCV. No chest pain or SOB. Lives with wife, granddaughter manage medication  Scheduled Meds:  [MAR Hold] apixaban   5 mg Oral BID   [MAR Hold] dapagliflozin  propanediol  10 mg Oral Daily   [MAR Hold] donepezil   10 mg Oral QHS   [MAR Hold] escitalopram   20 mg Oral Daily   [MAR Hold] ezetimibe   10 mg Oral Daily   [MAR Hold] furosemide   20 mg Oral Daily   [MAR Hold] gabapentin   300 mg Oral QHS   [MAR Hold] guaiFENesin   600 mg Oral BID   [MAR Hold] insulin  aspart  0-5 Units Subcutaneous QHS   [MAR Hold] insulin  aspart  0-6 Units Subcutaneous TID WC   [MAR Hold] memantine   10 mg Oral BID   [MAR Hold] metoprolol  tartrate  25 mg Oral BID   [MAR Hold] sodium chloride  flush  3 mL Intravenous Q12H   [MAR Hold] traZODone   150 mg Oral QHS   Continuous Infusions:  PRN Meds: [MAR Hold] acetaminophen  **OR** [MAR Hold] acetaminophen , [MAR Hold] artificial tears, [MAR Hold] ipratropium-albuterol , [MAR Hold] senna-docusate   Vital Signs  Vitals:   06/07/24 0335 06/07/24 0742 06/07/24 0815 06/07/24 0945  BP: 102/66 101/62 120/80 115/74  Pulse: (!) 117 (!) 121 (!) 119 64  Resp: 18 18  19   Temp: 97.7 F (36.5 C) 98 F (36.7 C)    TempSrc: Oral Oral    SpO2: 93% 93% 96% 92%  Weight:      Height:        Intake/Output Summary (Last 24 hours) at 06/07/2024 1003 Last data filed at 06/06/2024 1700 Gross per 24 hour  Intake 240 ml  Output --  Net 240 ml      06/04/2024   11:56 AM 06/03/2024   12:51 PM 03/16/2024    2:07 PM  Last 3 Weights  Weight (lbs) 185 lb 14.4 oz 182 lb 181 lb  Weight (kg) 84.324 kg 82.555 kg 82.101 kg      Telemetry NSR with HR 60s - Personally Reviewed  ECG  NSR with TWI in the lateral leads - Personally Reviewed  Physical Exam  GEN: No acute distress.   Neck: No JVD Cardiac: RRR, no  murmurs, rubs, or gallops.  Respiratory: Clear to auscultation bilaterally. GI: Soft, nontender, non-distended  MS: No edema; No deformity. Neuro:  Nonfocal  Psych: Normal affect   Labs High Sensitivity Troponin:  No results for input(s): TROPONINIHS in the last 720 hours.   Chemistry Recent Labs  Lab 06/03/24 1404 06/04/24 0257 06/05/24 0510 06/06/24 0304  NA 139 136 136 137  K 4.0 4.2 4.7 4.2  CL 103 103 102 102  CO2 24 22 23 25   GLUCOSE 121* 263* 158* 143*  BUN 10 12 20  24*  CREATININE 0.89 1.03 1.13 1.11  CALCIUM  8.5* 8.8* 8.9 8.4*  MG 1.8  --   --   --   PROT 6.3*  --   --   --   ALBUMIN 3.7  --   --   --   AST 18  --   --   --   ALT 19  --   --   --   ALKPHOS 80  --   --   --   BILITOT 0.5  --   --   --  GFRNONAA >60 >60 >60 >60  ANIONGAP 12 11 12 11     Lipids No results for input(s): CHOL, TRIG, HDL, LABVLDL, LDLCALC, CHOLHDL in the last 168 hours.  Hematology Recent Labs  Lab 06/04/24 0257 06/05/24 0510 06/06/24 0304  WBC 5.4 7.2 7.0  RBC 4.09* 4.27 4.41  HGB 11.5* 12.0* 12.4*  HCT 37.1* 37.9* 38.9*  MCV 90.7 88.8 88.2  MCH 28.1 28.1 28.1  MCHC 31.0 31.7 31.9  RDW 14.6 14.6 14.3  PLT 153 199 165   Thyroid  No results for input(s): TSH, FREET4 in the last 168 hours.  BNP Recent Labs  Lab 06/03/24 1526  PROBNP 3,487.0*    DDimer No results for input(s): DDIMER in the last 168 hours.   Radiology  EP STUDY Result Date: 06/07/2024 See surgical note for result.   Cardiac Studies  TEE today - report pending  Patient Profile   79 y.o. 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 BNP elevated at 3000. He was in atrial flutter with RVR. Missed Eliquis  2 week ago.   Assessment & Plan   Atrial flutter  with RVR   - occasionally missed anticoagulation therapy at home  - restart on Eliquis . Started on cardizem , transitioned to metoprolol  25mg  BID (unable to uptitrate due to hypotension and history of bradycardia)  - underwent successful TEE DCCV.   - seen post DCCV, feeling ok, looks a little clammy on exam. SBP borderline low at 101. Consider PT/OT today and DC tomorrow.   Acute on chronic HFimpEF: EF 50-55% on echo Sept 2025. Given IV lasix . Continue farxiga . Started on lasix  20mg  daily during this admission, will need BMET in 5-7 days to reassess renal function. Euvolemic on exam.   Neurological symptom: MRI of brain no acute finding  COPD: no acute exacerbation    For questions or updates, please contact [facility] Please consult [email or web address] for contact info under     Signed, Scot Ford, PA  06/07/2024, 10:03 AM    "

## 2024-06-07 NOTE — Transfer of Care (Signed)
 Immediate Anesthesia Transfer of Care Note  Patient: Herbert Evans.  Procedure(s) Performed: TRANSESOPHAGEAL ECHOCARDIOGRAM CARDIOVERSION  Patient Location: PACU and Cath Lab  Anesthesia Type:MAC  Level of Consciousness: drowsy  Airway & Oxygen Therapy: Patient Spontanous Breathing and Patient connected to nasal cannula oxygen  Post-op Assessment: Report given to RN and Post -op Vital signs reviewed and stable  Post vital signs: Reviewed and stable  Last Vitals:  Vitals Value Taken Time  BP 114/72 06/07/24 09:30  Temp    Pulse 66 06/07/24 09:31  Resp 18 06/07/24 09:31  SpO2 96 % 06/07/24 09:31  Vitals shown include unfiled device data.  Last Pain:  Vitals:   06/07/24 0815  TempSrc:   PainSc: 0-No pain      Patients Stated Pain Goal: 0 (06/06/24 2109)  Complications: There were no known notable events for this encounter.

## 2024-06-07 NOTE — Plan of Care (Signed)
   Problem: Education: Goal: Ability to describe self-care measures that may prevent or decrease complications (Diabetes Survival Skills Education) will improve Outcome: Progressing Goal: Individualized Educational Video(s) Outcome: Progressing   Problem: Coping: Goal: Ability to adjust to condition or change in health will improve Outcome: Progressing   Problem: Fluid Volume: Goal: Ability to maintain a balanced intake and output will improve Outcome: Progressing   Problem: Health Behavior/Discharge Planning: Goal: Ability to identify and utilize available resources and services will improve Outcome: Progressing Goal: Ability to manage health-related needs will improve Outcome: Progressing   Problem: Metabolic: Goal: Ability to maintain appropriate glucose levels will improve Outcome: Progressing   Problem: Nutritional: Goal: Maintenance of adequate nutrition will improve Outcome: Progressing Goal: Progress toward achieving an optimal weight will improve Outcome: Progressing   Problem: Skin Integrity: Goal: Risk for impaired skin integrity will decrease Outcome: Progressing   Problem: Tissue Perfusion: Goal: Adequacy of tissue perfusion will improve Outcome: Progressing   Problem: Education: Goal: Knowledge of General Education information will improve Description: Including pain rating scale, medication(s)/side effects and non-pharmacologic comfort measures Outcome: Progressing   Problem: Health Behavior/Discharge Planning: Goal: Ability to manage health-related needs will improve Outcome: Progressing   Problem: Clinical Measurements: Goal: Ability to maintain clinical measurements within normal limits will improve Outcome: Progressing Goal: Will remain free from infection Outcome: Progressing Goal: Diagnostic test results will improve Outcome: Progressing Goal: Respiratory complications will improve Outcome: Progressing Goal: Cardiovascular complication will  be avoided Outcome: Progressing   Problem: Activity: Goal: Risk for activity intolerance will decrease Outcome: Progressing   Problem: Nutrition: Goal: Adequate nutrition will be maintained Outcome: Progressing   Problem: Coping: Goal: Level of anxiety will decrease Outcome: Progressing   Problem: Elimination: Goal: Will not experience complications related to bowel motility Outcome: Progressing Goal: Will not experience complications related to urinary retention Outcome: Progressing   Problem: Pain Managment: Goal: General experience of comfort will improve and/or be controlled Outcome: Progressing   Problem: Safety: Goal: Ability to remain free from injury will improve Outcome: Progressing   Problem: Skin Integrity: Goal: Risk for impaired skin integrity will decrease Outcome: Progressing   Problem: Education: Goal: Knowledge of disease or condition will improve Outcome: Progressing Goal: Understanding of medication regimen will improve Outcome: Progressing Goal: Individualized Educational Video(s) Outcome: Progressing   Problem: Activity: Goal: Ability to tolerate increased activity will improve Outcome: Progressing   Problem: Cardiac: Goal: Ability to achieve and maintain adequate cardiopulmonary perfusion will improve Outcome: Progressing   Problem: Health Behavior/Discharge Planning: Goal: Ability to safely manage health-related needs after discharge will improve Outcome: Progressing

## 2024-06-07 NOTE — Progress Notes (Signed)
 Called the patient bedside due to acute onset of swelling and feeling colder that started about an hour ago.  He is 6 hours out from his TEE cardioversion.  Blood pressure stable.  Systolic blood pressure 104.  He denies any chest pain.  Upper extremity is warm to touch.  RV is severely down to TEE, however CTA of the chest was negative for PE on arrival.  Patient appears to be perfusing, suspicion for cardiogenic shock fairly low.  Will obtain lactic acid, basic metabolic panel and CBC.  Lasix  has been discontinued.  Blood pressure stable.  Continue observation.

## 2024-06-08 ENCOUNTER — Inpatient Hospital Stay (HOSPITAL_COMMUNITY)

## 2024-06-08 ENCOUNTER — Encounter (HOSPITAL_COMMUNITY): Payer: Self-pay

## 2024-06-08 DIAGNOSIS — I4892 Unspecified atrial flutter: Secondary | ICD-10-CM | POA: Diagnosis not present

## 2024-06-08 DIAGNOSIS — I5041 Acute combined systolic (congestive) and diastolic (congestive) heart failure: Secondary | ICD-10-CM

## 2024-06-08 LAB — GLUCOSE, CAPILLARY
Glucose-Capillary: 242 mg/dL — ABNORMAL HIGH (ref 70–99)
Glucose-Capillary: 260 mg/dL — ABNORMAL HIGH (ref 70–99)
Glucose-Capillary: 194 mg/dL — ABNORMAL HIGH (ref 70–99)
Glucose-Capillary: 155 mg/dL — ABNORMAL HIGH (ref 70–99)

## 2024-06-08 MED ORDER — METOPROLOL SUCCINATE ER 25 MG PO TB24
25.0000 mg | ORAL_TABLET | Freq: Two times a day (BID) | ORAL | Status: DC
  Administered 2024-06-08: 25 mg via ORAL
  Filled 2024-06-08: qty 1

## 2024-06-08 MED ORDER — AMIODARONE HCL 200 MG PO TABS: 400.0000 mg | ORAL_TABLET | Freq: Every day | ORAL | Status: DC

## 2024-06-08 MED ORDER — INSULIN GLARGINE-YFGN 100 UNIT/ML ~~LOC~~ SOLN
8.0000 [IU] | Freq: Every day | SUBCUTANEOUS | Status: DC
  Administered 2024-06-08 – 2024-06-09 (×2): 8 [IU] via SUBCUTANEOUS
  Filled 2024-06-08 (×2): qty 0.08

## 2024-06-08 MED ORDER — METOPROLOL TARTRATE 25 MG PO TABS: 25.0000 mg | ORAL_TABLET | Freq: Four times a day (QID) | ORAL | Status: DC | PRN

## 2024-06-08 MED ORDER — AMIODARONE HCL 200 MG PO TABS: 200.0000 mg | ORAL_TABLET | Freq: Every day | ORAL | Status: DC

## 2024-06-08 MED ORDER — PANTOPRAZOLE SODIUM 40 MG PO TBEC
40.0000 mg | DELAYED_RELEASE_TABLET | Freq: Every day | ORAL | Status: DC
  Administered 2024-06-08 – 2024-06-09 (×2): 40 mg via ORAL
  Filled 2024-06-08 (×2): qty 1

## 2024-06-08 MED ORDER — AMIODARONE HCL 200 MG PO TABS
400.0000 mg | ORAL_TABLET | Freq: Two times a day (BID) | ORAL | Status: DC
  Administered 2024-06-08 – 2024-06-09 (×3): 400 mg via ORAL
  Filled 2024-06-08 (×3): qty 2

## 2024-06-08 NOTE — Progress Notes (Signed)
 Mobility Specialist Progress Note;    06/08/24 1044  Mobility  Activity Ambulated with assistance  Level of Assistance Contact guard assist, steadying assist  Assistive Device None  Distance Ambulated (ft) 225 ft  Activity Response Tolerated well  Mobility Referral Yes  Mobility visit 1 Mobility  Mobility Specialist Start Time (ACUTE ONLY) 1044  Mobility Specialist Stop Time (ACUTE ONLY) 1054  Mobility Specialist Time Calculation (min) (ACUTE ONLY) 10 min   Pt received in bed agreeable to mobility. No physical assistance required CGA for safety. Family expressed concerns of high HR, HR maintained 105 throughout session with no complaints. Pt returned to bed with call bell and personal belongings in reach. All needs met.   Ricky Janeal MATSU, BS Mobility Specialist Please contact via Special Educational Needs Teacher or [employer] [phone]

## 2024-06-08 NOTE — Progress Notes (Signed)
 " PROGRESS NOTE  Herbert JEFFRIES Sr.  FMW:[id number] DOB: May 19, 1945 DOA: 06/03/2024 PCP: Silvano Angeline FALCON, NP   Brief Narrative: Patient is a 79 year old male with history of diabetes type 2, A-fib/a flutter status post ablation/DCCV, HF with improved EF, memory loss who presented with complaint of palpitations, balance issues, left eye drooping.  No focal weakness or numbness on presentation.  On presentation, he was in A-fib with RVR with heart rate in the range of 120s, rhythm was A-fib.  CT chest negative for PE.  No acute findings in MRI.  Patient admitted for the management of A-fib with RVR.  Started on Cardizem  drip.  Cardiology consulted.  Lab work showed elevated BNP.  S/P DCCV on 1/26, but went into rapid A-fib later that day requiring IV amiodarone .  This morning, remains in A-fib rhythm with controlled heart rate.  Possible discharge tomorrow.  Assessment & Plan:  Principal Problem:   Atrial flutter with rapid ventricular response (HCC) Active Problems:   Type 2 diabetes mellitus with complication, without long-term current use of insulin  (HCC)   Acute on chronic heart failure with preserved ejection fraction (HFpEF) (HCC)   Heart failure with improved ejection fraction (HFimpEF) (HCC)   Acute combined systolic and diastolic heart failure (HCC)   Rapid atrial flutter:  Presented with A-fib.  Started on Cardizem  drip.  Cardiology following.  S/P TEE/DCCV on 1/26 but went into rapid A-fib again.. Started on Eliquis .  Was on imager drip, now changed to oral.  This morning heart rate is stable but in A-fib rhythm.  Acute on chronic HFimpEF: Echo on September 2025 showed EF of 50 to 55%, grade 3 diastolic dysfunction.  Given IV Lasix .  Continue Farxiga .  Elevated BNP on presentation.  Given another dose of IV Lasix  on 1/23.  Lungs are mostly clear , no peripheral edema.  On oral Lasix  now.  Type 2 diabetes: On Farxiga .  Currently on sliding scale  History of hyperlipidemia: Intolerant to  statin.  Started on Zetia   History of COPD: History of extensive passive smoking.  Family members smoked around him.  No wheezing today.  Continue bronchodilators as needed.   on room air.  Needs nebulizer machine at home.  Continue Mucinex   Concern for stroke/balance issues/left eye droop: There was concern for left facial weakness, balance issues.  No acute findings as per MRI.  PT consulted.  No follow-up recommended.  Memory issues: On Namenda , Aricept .  Continue delirium precautions        DVT prophylaxis: apixaban  (ELIQUIS ) tablet 5 mg     Code Status: Full Code  Family Communication: Wife at bedside on 1/27  Patient status:Inpatient  Patient is from :home  Anticipated discharge un:ynfz  Estimated DC date:tomorrow   Consultants: Cardiology  Procedures: DCCV  Antimicrobials:  Anti-infectives (From admission, onward)    None       Subjective: Patient seen and examined at the bedside today.  Remained in A-fib rhythm with controlled rate.  On room air.  He had briefly converted to normal sinus rhythm yesterday but went into rapid A-fib later that day Overall comfortable.  Lying in bed.  We discussed about monitoring another day  Objective: Vitals:   06/07/24 2100 06/07/24 2314 06/08/24 0443 06/08/24 0758  BP: 103/79 106/73 115/73 122/85  Pulse: 88 85 84   Resp: 18 18 18 18   Temp: (!) 97.4 F (36.3 C) (!) 97.4 F (36.3 C) 97.6 F (36.4 C) 97.9 F (36.6 C)  TempSrc: Oral Oral  Oral Oral  SpO2: 97% 92% 93% 97%  Weight:      Height:       No intake or output data in the 24 hours ending 06/08/24 1122  Filed Weights   06/03/24 1251 06/04/24 1156  Weight: 82.6 kg 84.3 kg    Examination:  General exam: Overall comfortable, not in distress HEENT: PERRL Respiratory system:  no wheezes or crackles  Cardiovascular system: Irregularly  irregular rhythm Gastrointestinal system: Abdomen is nondistended, soft and nontender. Central nervous system: Alert and  oriented Extremities: No edema, no clubbing ,no cyanosis Skin: No rashes, no ulcers,no icterus     Data Reviewed: I have personally reviewed following labs and imaging studies  CBC: Recent Labs  Lab 06/03/24 1404 06/04/24 0257 06/05/24 0510 06/06/24 0304 06/07/24 2002  WBC 5.4 5.4 7.2 7.0 6.9  NEUTROABS 3.9  --   --   --   --   HGB 11.2* 11.5* 12.0* 12.4* 12.6*  HCT 34.6* 37.1* 37.9* 38.9* 38.4*  MCV 88.7 90.7 88.8 88.2 87.7  PLT 146* 153 199 165 198   Basic Metabolic Panel: Recent Labs  Lab 06/03/24 1404 06/04/24 0257 06/05/24 0510 06/06/24 0304 06/07/24 2002  NA 139 136 136 137 137  K 4.0 4.2 4.7 4.2 4.2  CL 103 103 102 102 100  CO2 24 22 23 25 27   GLUCOSE 121* 263* 158* 143* 199*  BUN 10 12 20  24* 28*  CREATININE 0.89 1.03 1.13 1.11 1.15  CALCIUM  8.5* 8.8* 8.9 8.4* 8.7*  MG 1.8  --   --   --   --      Recent Results (from the past 240 hours)  Resp panel by RT-PCR (RSV, Flu A&B, Covid) Anterior Nasal Swab     Status: None   Collection Time: 06/03/24  2:33 PM   Specimen: Anterior Nasal Swab  Result Value Ref Range Status   SARS Coronavirus 2 by RT PCR NEGATIVE NEGATIVE Final   Influenza A by PCR NEGATIVE NEGATIVE Final   Influenza B by PCR NEGATIVE NEGATIVE Final    Comment: (NOTE) The Xpert Xpress SARS-CoV-2/FLU/RSV plus assay is intended as an aid in the diagnosis of influenza from Nasopharyngeal swab specimens and should not be used as a sole basis for treatment. Nasal washings and aspirates are unacceptable for Xpert Xpress SARS-CoV-2/FLU/RSV testing.  Fact Sheet for Patients: bloggercourse.com  Fact Sheet for Healthcare Providers: seriousbroker.it  This test is not yet approved or cleared by the [place]  FDA and has been authorized for detection and/or diagnosis of SARS-CoV-2 by FDA under an Emergency Use Authorization (EUA). This EUA will remain in effect (meaning this test can be used) for  the duration of the COVID-19 declaration under Section 564(b)(1) of the Act, 21 U.S.C. section 360bbb-3(b)(1), unless the authorization is terminated or revoked.     Resp Syncytial Virus by PCR NEGATIVE NEGATIVE Final    Comment: (NOTE) Fact Sheet for Patients: bloggercourse.com  Fact Sheet for Healthcare Providers: seriousbroker.it  This test is not yet approved or cleared by the [place]  FDA and has been authorized for detection and/or diagnosis of SARS-CoV-2 by FDA under an Emergency Use Authorization (EUA). This EUA will remain in effect (meaning this test can be used) for the duration of the COVID-19 declaration under Section 564(b)(1) of the Act, 21 U.S.C. section 360bbb-3(b)(1), unless the authorization is terminated or revoked.  Performed at [facility], [address] [place], [place] [address]      Radiology  Studies: ECHO TEE Result Date: 06/07/2024    TRANSESOPHOGEAL ECHO REPORT   Patient Name:   Herbert ENGEN Sr. Date of Exam: 06/07/2024 Medical Rec #:  [id number]          Height:       70.5 in Accession #:    [id number]         Weight:       185.9 lb Date of Birth:  1945/07/31          BSA:          2.034 m Patient Age:    78 years           BP:           120/80 mmHg Patient Gender: M                  HR:           120 bpm. Exam Location:  Inpatient Procedure: Transesophageal Echo, Cardiac Doppler and Color Doppler (Both            Spectral and Color Flow Doppler were utilized during procedure). Indications:     Arrhythmia  History:         Patient has prior history of Echocardiogram examinations, most                  recent 02/02/2024. Risk Factors:Hypertension and Diabetes.  Sonographer:     Jayson Gaskins Referring Phys:  [id number] LEONTINE SAILOR LOCKWOOD Diagnosing Phys: Joelle Cedars Tonleu PROCEDURE: After discussion of the risks and benefits of a TEE, an informed consent was obtained from the patient. The  transesophogeal probe was passed without difficulty through the esophogus of the patient. Sedation performed by different physician. The patient was monitored while under deep sedation. Anesthestetic sedation was provided intravenously by Anesthesiology: 370mg  of Propofol . The patient developed no complications during the procedure. A successful direct current cardioversion was performed at 200 joules with 1 attempt.  IMPRESSIONS  1. Left ventricular ejection fraction, by estimation, is 35 to 40%. The left ventricle has moderately decreased function.  2. Right ventricular systolic function is severely reduced. The right ventricular size is normal.  3. Left atrial size was moderately dilated. No left atrial/left atrial appendage thrombus was detected.  4. Right atrial size was moderately dilated.  5. The mitral valve is grossly normal. Mild mitral valve regurgitation.  6. Tricuspid valve regurgitation is mild to moderate.  7. The aortic valve is tricuspid. Aortic valve regurgitation is not visualized. Aortic valve sclerosis is present, with no evidence of aortic valve stenosis.  8. There is Moderate (Grade III) protruding plaque. FINDINGS  Left Ventricle: Left ventricular ejection fraction, by estimation, is 35 to 40%. The left ventricle has moderately decreased function. The left ventricular internal cavity size was normal in size. Right Ventricle: The right ventricular size is normal. No increase in right ventricular wall thickness. Right ventricular systolic function is severely reduced. Left Atrium: Left atrial size was moderately dilated. No left atrial/left atrial appendage thrombus was detected. Right Atrium: Right atrial size was moderately dilated. Pericardium: There is no evidence of pericardial effusion. Mitral Valve: The mitral valve is grossly normal. Mild mitral valve regurgitation. Tricuspid Valve: The tricuspid valve is grossly normal. Tricuspid valve regurgitation is mild to moderate. Aortic Valve:  The aortic valve is tricuspid. Aortic valve regurgitation is not visualized. Aortic valve sclerosis is present, with no evidence of aortic valve stenosis. Pulmonic Valve: The pulmonic valve was normal in structure.  Pulmonic valve regurgitation is mild. Aorta: The aortic root, ascending aorta and aortic arch are all structurally normal, with no evidence of dilitation or obstruction. There is moderate (Grade III) protruding plaque. IAS/Shunts: No atrial level shunt detected by color flow Doppler.  TRICUSPID VALVE TR Peak grad:   28.6 mmHg TR Vmax:        267.53 cm/s Joelle Cedars Tonleu Electronically signed by Joelle Cedars Tonleu Signature Date/Time: 06/07/2024/12:58:51 PM    Final    EP STUDY Result Date: 06/07/2024 See surgical note for result.    Scheduled Meds:  amiodarone   400 mg Oral BID   Followed by   NOREEN ON 06/15/2024] amiodarone   400 mg Oral Daily   Followed by   NOREEN ON 06/22/2024] amiodarone   200 mg Oral Daily   apixaban   5 mg Oral BID   dapagliflozin  propanediol  10 mg Oral Daily   donepezil   10 mg Oral QHS   escitalopram   20 mg Oral Daily   ezetimibe   10 mg Oral Daily   gabapentin   300 mg Oral QHS   guaiFENesin   600 mg Oral BID   insulin  aspart  0-5 Units Subcutaneous QHS   insulin  aspart  0-6 Units Subcutaneous TID WC   memantine   10 mg Oral BID   sodium chloride  flush  3 mL Intravenous Q12H   traZODone   150 mg Oral QHS   Continuous Infusions:     LOS: 5 days   Ivonne Mustache, MD [facility] P1/27/2026, 11:22 AM  "

## 2024-06-08 NOTE — Progress Notes (Deleted)
 Date and time results received: 06/08/24 1728  Test: hemoglobin Critical Value: 5.4  Name of Provider Notified: Dr. Lonni Moose  Orders Received? Or Actions Taken?: Orders Received - See Orders for details

## 2024-06-08 NOTE — Progress Notes (Addendum)
"  °  Progress Note  Patient Name: Herbert ELLIS Sr. Date of Encounter: 06/08/2024 [facility] Cardiologist: [name], DO   Interval Summary   Doing well today. Had overnight episode of sweating and feeling off. States that he feels better this morning, no recurrences. No new complaints or concerns. Denies any chest pain, dyspnea, palpitations, lower extremity swelling at this time.   Vital Signs Vitals:   06/07/24 2100 06/07/24 2314 06/08/24 0443 06/08/24 0758  BP: 103/79 106/73 115/73 122/85  Pulse: 88 85 84   Resp: 18 18 18 18   Temp: (!) 97.4 F (36.3 C) (!) 97.4 F (36.3 C) 97.6 F (36.4 C) 97.9 F (36.6 C)  TempSrc: Oral Oral Oral Oral  SpO2: 97% 92% 93% 97%  Weight:      Height:       No intake or output data in the 24 hours ending 06/08/24 0923    06/04/2024   11:56 AM 06/03/2024   12:51 PM 03/16/2024    2:07 PM  Last 3 Weights  Weight (lbs) 185 lb 14.4 oz 182 lb 181 lb  Weight (kg) 84.324 kg 82.555 kg 82.101 kg      Telemetry/ECG  Atypical atrial flutter, rates in the 90s - Personally Reviewed  Physical Exam  GEN: Resting in bed, in no acute distress.   Neck: No JVD Cardiac: Irregularly irregular rhythm, normal rate, no murmurs, rubs, or gallops.  Respiratory: Rhonchi on auscultation bilaterally R > L GI: Soft, nontender MS: No peripheral edema  Assessment & Plan  Atrial fibrillation/Atrial flutter with RVR Frequent PVCs On amiodarone  up until 03/16/24 OP visit S/p successful TEE/DCCV 1/26 Overnight episode of acute onset swelling and diaphoresis ~6 hours post-TEE/DCCV. Vitals remain stable. EKG showed atrial fibrillation 107 bpm. Unremarkable lactic acid, BMP, CBC. Started on IV amiodarone . Pt feeling better this morning. 12-lead EKG pending this AM Will transition to from IV to PO amiodarone  400 mg BID x 1 week, followed by 200 mg BID x 1 week, then 200 mg daily Continue Eliquis  5 mg BID (appropriately dosed)  Acute on chronic HFimpEF Echo  [06/07/24]: LVEF 35-40%, severely reduced RV systolic function, moderate BAE, mild MR, mild-moderate TR, grade 3 protruding plaque Euvolemic on exam Continue Farxiga  10 mg  Per primary COPD   For questions or updates, please contact [facility] Please consult [email or web address] for contact info under         Signed, Owen MARLA Daniels, PA-C   "

## 2024-06-08 NOTE — Progress Notes (Signed)
 Patient reporting sweating, general malaise and new low level left flank pain; similar presentation to yesterday.  Vital signs obtained are nominal:   06/08/24 1355  Vitals  Temp 98 F (36.7 C)  Temp Source Oral  BP 120/83  MAP (mmHg) 94  BP Location Left Arm  BP Method Automatic  Patient Position (if appropriate) Lying  Pulse Rate 88  Pulse Rate Source Monitor  ECG Heart Rate 88  Resp 18  Level of Consciousness  Level of Consciousness Alert  MEWS COLOR  MEWS Score Color Green  Oxygen Therapy  SpO2 99 %  O2 Device Room Air  Pain Assessment  Pain Scale 0-10  Pain Score 5  Pain Type Acute pain  Pain Location Flank  Pain Orientation Left  Pain Radiating Towards none  Pain Descriptors / Indicators Pins and needles  Pain Frequency Intermittent  Pain Onset Sudden  Patients Stated Pain Goal 0  Pain Intervention(s) MD notified (Comment)  MEWS Score  MEWS Temp 0  MEWS Systolic 0  MEWS Pulse 0  MEWS RR 0  MEWS LOC 0  MEWS Score 0   Notified Dr. Jillian, Waddell Donath, PA for cardiology and charge RN.  Continuing to monitor closely.

## 2024-06-08 NOTE — Discharge Instructions (Signed)
 For Heart Rate: -if resting heart rate is more than 110 bpm, but you feel ok, take one dose of metoprolol  tartrate 25 mg, recheck in 2 hours. If heart rate still above 110 but blood pressure is above 100 on the top (which is around your normal), can take a second dose of metoprolol  -if resting heart rate is above 110 bpm, and either you feel poorly or your blood pressure is 90 or less on the top number, come to ER -look into Kardia Mobile device to monitor rhythm at home

## 2024-06-08 NOTE — TOC Progression Note (Signed)
 Transition of Care (TOC) - Progression Note    Patient Details  Name: Herbert Evans Sr. MRN: [id number] Date of Birth: 05-19-1945  Transition of Care [facility]) CM/SW Contact  Graves-Bigelow, Erminio Deems, RN Phone Number: 06/08/2024, 12:48 PM  Clinical Narrative: [facility] has delivered a new nebulizer to the patient's room. No further DME needs identified at this time. ICM will continue to follow for additional needs.    Expected Discharge Plan: Home/Self Care Barriers to Discharge: No Barriers Identified  Expected Discharge Plan and Services   Discharge Planning Services: CM Consult Post Acute Care Choice: NA Living arrangements for the past 2 months: Single Family Home                   DME Agency: NA       HH Arranged: NA   Social Drivers of Health (SDOH) Interventions SDOH Screenings   Food Insecurity: No Food Insecurity (06/04/2024)  Housing: Low Risk (06/04/2024)  Transportation Needs: No Transportation Needs (06/04/2024)  Utilities: Not At Risk (06/04/2024)  Social Connections: Moderately Isolated (06/04/2024)  Tobacco Use: High Risk (06/03/2024)    Readmission Risk Interventions    01/23/2022    4:28 PM  Readmission Risk Prevention Plan  Post Dischage Appt Complete  Medication Screening Complete

## 2024-06-08 NOTE — Inpatient Diabetes Management (Signed)
 Inpatient Diabetes Program Recommendations  AACE/ADA: New Consensus Statement on Inpatient Glycemic Control   Target Ranges:  Prepandial:   less than 140 mg/dL      Peak postprandial:   less than 180 mg/dL (1-2 hours)      Critically ill patients:  140 - 180 mg/dL   Lab Results  Component Value Date   GLUCAP 242 (H) 06/08/2024   HGBA1C 7.5 (H) 06/04/2024    Latest Reference Range & Units 06/07/24 07:41 06/07/24 11:40 06/07/24 14:37 06/07/24 16:14 06/07/24 21:04 06/08/24 07:59  Glucose-Capillary 70 - 99 mg/dL 864 (H) 861 (H) 776 (H) 220 (H) 179 (H) 242 (H)   Review of Glycemic Control  Diabetes history: DM2  Outpatient Diabetes medications:  Tresiba 12 units daily  Farxiga  10mg  daily  Metformin 1,000mg  BID  Current orders for Inpatient glycemic control:  Farxiga  10mg  daily  Novolog  0-6 units TID + 0-5 units at bedtime   Inpatient Diabetes Program Recommendations:   Noted -  CBG 242 mg/dL this morning  - Takes Traseiba 12 units daily at home   Please consider starting Lantus  8 units daily   Thanks,  Lavanda Search, RN, MSN, [facility]  Inpatient Diabetes Coordinator  Pager [phone] (8a-5p)

## 2024-06-09 ENCOUNTER — Other Ambulatory Visit (HOSPITAL_COMMUNITY): Payer: Self-pay

## 2024-06-09 DIAGNOSIS — I4892 Unspecified atrial flutter: Secondary | ICD-10-CM | POA: Diagnosis not present

## 2024-06-09 LAB — BASIC METABOLIC PANEL WITH GFR
Anion gap: 10 (ref 5–15)
BUN: 22 mg/dL (ref 8–23)
CO2: 25 mmol/L (ref 22–32)
Calcium: 8.7 mg/dL — ABNORMAL LOW (ref 8.9–10.3)
Chloride: 103 mmol/L (ref 98–111)
Creatinine, Ser: 1.02 mg/dL (ref 0.61–1.24)
GFR, Estimated: >60 mL/min
Glucose, Bld: 145 mg/dL — ABNORMAL HIGH (ref 70–99)
Potassium: 4.4 mmol/L (ref 3.5–5.1)
Sodium: 138 mmol/L (ref 135–145)

## 2024-06-09 LAB — GLUCOSE, CAPILLARY: Glucose-Capillary: 149 mg/dL — ABNORMAL HIGH (ref 70–99)

## 2024-06-09 LAB — CBC
HCT: 42.3 % (ref 39.0–52.0)
Hemoglobin: 13.6 g/dL (ref 13.0–17.0)
MCH: 27.9 pg (ref 26.0–34.0)
MCHC: 32.2 g/dL (ref 30.0–36.0)
MCV: 86.7 fL (ref 80.0–100.0)
Platelets: 206 10*3/uL (ref 150–400)
RBC: 4.88 MIL/uL (ref 4.22–5.81)
RDW: 14.1 % (ref 11.5–15.5)
WBC: 7.7 10*3/uL (ref 4.0–10.5)
nRBC: 0 % (ref 0.0–0.2)

## 2024-06-09 MED ORDER — IPRATROPIUM-ALBUTEROL 0.5-2.5 (3) MG/3ML IN SOLN
3.0000 mL | Freq: Four times a day (QID) | RESPIRATORY_TRACT | 0 refills | Status: AC | PRN
  Filled 2024-06-09: qty 360, 30d supply, fill #0

## 2024-06-09 MED ORDER — PANTOPRAZOLE SODIUM 40 MG PO TBEC
40.0000 mg | DELAYED_RELEASE_TABLET | Freq: Every day | ORAL | 0 refills | Status: AC
  Filled 2024-06-09: qty 30, 30d supply, fill #0

## 2024-06-09 MED ORDER — GUAIFENESIN ER 600 MG PO TB12
600.0000 mg | ORAL_TABLET | Freq: Two times a day (BID) | ORAL | 0 refills | Status: AC
  Filled 2024-06-09: qty 10, 5d supply, fill #0

## 2024-06-09 MED ORDER — METOPROLOL TARTRATE 25 MG PO TABS
25.0000 mg | ORAL_TABLET | Freq: Four times a day (QID) | ORAL | 0 refills | Status: AC | PRN
  Filled 2024-06-09: qty 60, 15d supply, fill #0

## 2024-06-09 MED ORDER — AMIODARONE HCL 200 MG PO TABS
ORAL_TABLET | ORAL | 0 refills | Status: AC
  Filled 2024-06-09: qty 68, 43d supply, fill #0

## 2024-06-09 NOTE — Progress Notes (Signed)
 Discharge Nurse Summary: DC order noted per MD. DC RN at bedside with patient/family. Patient agreeable with discharge plan. AVS printed/reviewed. PIV removed, skin intact. No DME needs. No home meds. TOC meds pending pickup. CP/Edu resolved. Telemonitor returned to charging station. All belongings accounted for. Patient wheeled downstairs for discharge by private auto. TOC meds picked up on the way out.   Rosario EMERSON Lund, RN

## 2024-06-09 NOTE — Progress Notes (Signed)
 Physical Therapy Treatment Patient Details Name: Herbert NARINE Sr. MRN: [id number] DOB: [birth year] Today's Date: 06/09/2024   History of Present Illness 79 yo M adm 06/03/24 with balance issues, palpitations and L eye drooping. MRI brain negative. Admitted with A-fib w/ RVR. 1/26 DCCV.  PMHx: tremors, T2DM, Afib, CHF, memory loss, chronic cough, HLD    PT Comments  Pt received in supine with wife present and agreeable to PT session. Pt improved by being ModI for gait with no AD in the room and supervision when ambulating in the hallway. Pt reported being at mobility baseline with no questions/concerns about d/c home. Will continue to follow acutely to maximize mobility and independence prior to return home. Anticipate pt will continue to have no post-acute PT needs.     If plan is discharge home, recommend the following: Help with stairs or ramp for entrance;A little help with walking and/or transfers   Can travel by private vehicle      Yes  Equipment Recommendations  None recommended by PT       Precautions / Restrictions Precautions Precautions: Fall Recall of Precautions/Restrictions: Intact Restrictions Weight Bearing Restrictions Per Provider Order: No     Mobility  Bed Mobility Overal bed mobility: Modified Independent     Transfers Overall transfer level: Modified independent Equipment used: None     Ambulation/Gait Ambulation/Gait assistance: Supervision, Modified independent (Device/Increase time) Gait Distance (Feet): 250 Feet Assistive device: None Gait Pattern/deviations: Step-through pattern, Decreased stride length Gait velocity: decr    General Gait Details: ModI when ambulating in the room with supervision for safety for hallway ambulation. No overt LOB    Balance Overall balance assessment: Mild deficits observed, not formally tested       Communication Communication Communication: No apparent difficulties  Cognition Arousal: Alert Behavior  During Therapy: WFL for tasks assessed/performed   PT - Cognitive impairments: No apparent impairments    Following commands: Intact      Cueing Cueing Techniques: Verbal cues         Pertinent Vitals/Pain Pain Assessment Pain Assessment: No/denies pain     PT Goals (current goals can now be found in the care plan section) Acute Rehab PT Goals Patient Stated Goal: return home PT Goal Formulation: With patient Time For Goal Achievement: 06/11/24 Potential to Achieve Goals: Good Progress towards PT goals: Progressing toward goals    Frequency    Min 1X/week       AM-PAC PT 6 Clicks Mobility   Outcome Measure  Help needed turning from your back to your side while in a flat bed without using bedrails?: None Help needed moving from lying on your back to sitting on the side of a flat bed without using bedrails?: None Help needed moving to and from a bed to a chair (including a wheelchair)?: None Help needed standing up from a chair using your arms (e.g., wheelchair or bedside chair)?: None Help needed to walk in hospital room?: None Help needed climbing 3-5 steps with a railing? : A Little 6 Click Score: 23    End of Session   Activity Tolerance: Patient tolerated treatment well Patient left: in bed;with call bell/phone within reach;with family/visitor present Nurse Communication: Mobility status PT Visit Diagnosis: Other abnormalities of gait and mobility (R26.89);Difficulty in walking, not elsewhere classified (R26.2)     Time: 9091-9076 PT Time Calculation (min) (ACUTE ONLY): 15 min  Charges:    $Gait Training: 8-22 mins PT General Charges $$ ACUTE PT VISIT: 1 Visit  Kate ORN, PT, DPT Secure Chat Preferred  Rehab Office [phone]   Kate BRAVO Herbert Evans 06/09/2024, 10:23 AM

## 2024-06-09 NOTE — Discharge Summary (Signed)
 Physician Discharge Summary  SALLY REIMERS Sr. FMW:[id number] DOB: 04/24/1946 DOA: 06/03/2024  PCP: Silvano Angeline FALCON, NP  Admit date: 06/03/2024 Discharge date: 06/09/2024  Admitted From: Home Disposition:  Home  Discharge Condition:Stable CODE STATUS:FULL Diet recommendation: Heart Healthy   Brief/Interim Summary: Patient is a 79 year old male with history of diabetes type 2, A-fib/a flutter status post ablation/DCCV, HF with improved EF, memory loss who presented with complaint of palpitations, balance issues, left eye drooping.  No focal weakness or numbness on presentation.  On presentation, he was in A-fib with RVR with heart rate in the range of 120s, rhythm was A-fib.  CT chest negative for PE.  No acute findings in MRI.  Patient admitted for the management of A-fib with RVR.  Started on Cardizem  drip.  Cardiology consulted.  Lab work showed elevated BNP.  S/P DCCV on 1/26, but went into rapid A-fib later that day requiring IV amiodarone .  This morning, remains in A-fib rhythm with controlled heart rate.  Patient feels very to go home.  Cardiology cleared for discharge.  He will follow-up with cardiology as an outpatient.  Medically stable for discharge today  Following problems were addressed during the hospitalization:   Rapid atrial flutter:  Presented with A-fib.  Started on Cardizem  drip.  Cardiology following.  S/P TEE/DCCV on 1/26 but went into rapid A-fib again.. Started on Eliquis .  Was on amiodarone   drip, now changed to oral.  This morning heart rate is stable  He will follow-up with the A-fib clinic.  Continue amiodarone  oral.  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  Given IV Lasix .  Continue Farxiga .  Elevated BNP on presentation.  Given another dose of IV Lasix  on 1/23.  Lungs are mostly clear , no peripheral edema.  Lasix  discontinued.  TEE showed diminished EF of 35 to  40%.  Currently overall appears evaluated.   Type 2 diabetes: On Farxiga , Tresiba at home   History of hyperlipidemia: Intolerant to statin.  Started on Zetia    History of COPD: History of extensive passive smoking.  Family members smoked around him.  No wheezing today.    on room air.  Needs nebulizer machine at home:  TOC has supplied.  Continue Mucinex    Concern for stroke/balance issues/left eye droop: There was concern for left facial weakness, balance issues.  No acute findings as per MRI.  PT consulted.  No follow-up recommended.   Memory issues: On Namenda , Aricept .  Continue delirium precautions    Discharge Diagnoses:  Principal Problem:   Atrial flutter with rapid ventricular response (HCC) Active Problems:   Type 2 diabetes mellitus with complication, without long-term current use of insulin  (HCC)   Paroxysmal atrial fibrillation (HCC)   Acute on chronic heart failure with preserved ejection fraction (HFpEF) (HCC)   Heart failure with improved ejection fraction (HFimpEF) (HCC)   Acute combined systolic and diastolic heart failure (HCC)   Atrial flutter [facility])    Discharge Instructions  Discharge Instructions     Amb referral to AFIB Clinic   Complete by: As directed    Discharge instructions   Complete by: As directed    1)Please take your medications as instructed 2)Follow up with your PCP in a week 3)You have an appointment with cardiology on 06/24/24   Increase activity slowly   Complete by: As directed       Allergies as of 06/09/2024  Reactions   Crestor [rosuvastatin] Other (See Comments)   myalgias   Lipitor [atorvastatin] Other (See Comments)   myalgias        Medication List     STOP taking these medications    aspirin  EC 81 MG tablet       TAKE these medications    albuterol  0.63 MG/3ML nebulizer solution Commonly known as: ACCUNEB  Take 1 ampule by nebulization every 6 (six) hours as needed for wheezing or shortness of  breath.   amiodarone  200 MG tablet Commonly known as: PACERONE  Take 2 tablets (400 mg total) by mouth 2 (two) times daily for 6 days, THEN 2 tablets (400 mg total) daily for 7 days, THEN 1 tablet (200 mg total) daily. Start taking on: June 09, 2024   apixaban  5 MG Tabs tablet Commonly known as: ELIQUIS  Take 1 tablet (5 mg total) by mouth 2 (two) times daily.   Breztri Aerosphere 160-9-4.8 MCG/ACT Aero inhaler Generic drug: budesonide -glycopyrrolate-formoterol  Inhale 2 puffs into the lungs daily.   donepezil  10 MG tablet Commonly known as: ARICEPT  Take 10 mg by mouth at bedtime.   escitalopram  20 MG tablet Commonly known as: LEXAPRO  Take 20 mg by mouth daily.   ezetimibe  10 MG tablet Commonly known as: ZETIA  Take 1 tablet (10 mg total) by mouth daily.   Farxiga  10 MG Tabs tablet Generic drug: dapagliflozin  propanediol Take 10 mg by mouth daily.   fluticasone  50 MCG/ACT nasal spray Commonly known as: FLONASE Place 2 sprays into both nostrils daily as needed for allergies or rhinitis.   gabapentin  300 MG capsule Commonly known as: NEURONTIN  Take 300 mg by mouth at bedtime.   guaiFENesin  600 MG 12 hr tablet Commonly known as: MUCINEX  Take 1 tablet (600 mg total) by mouth 2 (two) times daily for 5 days.   lidocaine  5 % Commonly known as: Lidoderm  Place 1 patch onto the skin daily. Remove & Discard patch within 12 hours or as directed by MD What changed:  when to take this reasons to take this   memantine  10 MG tablet Commonly known as: NAMENDA  Take 10 mg by mouth 2 (two) times daily.   metFORMIN 1000 MG tablet Commonly known as: GLUCOPHAGE Take 1,000 mg by mouth 2 (two) times daily with a meal.   metoprolol  tartrate 25 MG tablet Commonly known as: LOPRESSOR  Take 1 tablet (25 mg total) by mouth every 6 (six) hours as needed (resting heart rate greater than 110 bbpm).   NON FORMULARY CPAP at bedtime.   pantoprazole  40 MG tablet Commonly known as:  PROTONIX  Take 1 tablet (40 mg total) by mouth daily. Start taking on: June 10, 2024   traZODone  100 MG tablet Commonly known as: DESYREL  Take 150 mg by mouth at bedtime.   Tresiba FlexTouch 200 UNIT/ML FlexTouch Pen Generic drug: insulin  degludec Inject 12 Units into the skin at bedtime.        Follow-up Information     Silvano Angeline FALCON, NP. Schedule an appointment as soon as possible for a visit in 1 week(s).   Contact information: [address] [place] [address] [phone]                Allergies[1]  Consultations: Cardiology   Procedures/Studies: DG CHEST PORT 1 VIEW Result Date: 06/08/2024 EXAM: 1 VIEW(S) XRAY OF THE CHEST 06/08/2024 03:03:00 PM COMPARISON: 06/03/2024 CLINICAL HISTORY: Chest pain. FINDINGS: LUNGS AND PLEURA: Left basilar opacity, likely a combination of left pleural effusion and atelectasis. Left apical pleural  thickening, unchanged. Small right pleural effusion. Bilateral interstitial prominence. No pneumothorax. HEART AND MEDIASTINUM: Stable cardiomegaly. Aortic atherosclerosis. BONES AND SOFT TISSUES: No acute osseous abnormality. IMPRESSION: 1.  bilateral interstitial prominence. 2. Left basilar opacity, likely a combination of left pleural effusion and atelectasis. 3. Small right pleural effusion. Electronically signed by: Norleen Boxer MD 06/08/2024 03:38 PM EST RP Workstation: [id number]   ECHO TEE Result Date: 06/07/2024    TRANSESOPHOGEAL ECHO REPORT   Patient Name:   JASIEL BELISLE Sr. Date of Exam: 06/07/2024 Medical Rec #:  [id number]          Height:       70.5 in Accession #:    [id number]         Weight:       185.9 lb Date of Birth:  Mar 03, 1946          BSA:          2.034 m Patient Age:    78 years           BP:           120/80 mmHg Patient Gender: M                  HR:           120 bpm. Exam Location:  Inpatient Procedure: Transesophageal Echo, Cardiac Doppler and Color Doppler (Both            Spectral and Color Flow  Doppler were utilized during procedure). Indications:     Arrhythmia  History:         Patient has prior history of Echocardiogram examinations, most                  recent 02/02/2024. Risk Factors:Hypertension and Diabetes.  Sonographer:     Jayson Gaskins Referring Phys:  [id number] LEONTINE SAILOR LOCKWOOD Diagnosing Phys: Joelle Cedars Tonleu PROCEDURE: After discussion of the risks and benefits of a TEE, an informed consent was obtained from the patient. The transesophogeal probe was passed without difficulty through the esophogus of the patient. Sedation performed by different physician. The patient was monitored while under deep sedation. Anesthestetic sedation was provided intravenously by Anesthesiology: 370mg  of Propofol . The patient developed no complications during the procedure. A successful direct current cardioversion was performed at 200 joules with 1 attempt.  IMPRESSIONS  1. Left ventricular ejection fraction, by estimation, is 35 to 40%. The left ventricle has moderately decreased function.  2. Right ventricular systolic function is severely reduced. The right ventricular size is normal.  3. Left atrial size was moderately dilated. No left atrial/left atrial appendage thrombus was detected.  4. Right atrial size was moderately dilated.  5. The mitral valve is grossly normal. Mild mitral valve regurgitation.  6. Tricuspid valve regurgitation is mild to moderate.  7. The aortic valve is tricuspid. Aortic valve regurgitation is not visualized. Aortic valve sclerosis is present, with no evidence of aortic valve stenosis.  8. There is Moderate (Grade III) protruding plaque. FINDINGS  Left Ventricle: Left ventricular ejection fraction, by estimation, is 35 to 40%. The left ventricle has moderately decreased function. The left ventricular internal cavity size was normal in size. Right Ventricle: The right ventricular size is normal. No increase in right ventricular wall thickness. Right ventricular systolic function  is severely reduced. Left Atrium: Left atrial size was moderately dilated. No left atrial/left atrial appendage thrombus was detected. Right Atrium: Right atrial size was moderately dilated. Pericardium: There is no evidence  of pericardial effusion. Mitral Valve: The mitral valve is grossly normal. Mild mitral valve regurgitation. Tricuspid Valve: The tricuspid valve is grossly normal. Tricuspid valve regurgitation is mild to moderate. Aortic Valve: The aortic valve is tricuspid. Aortic valve regurgitation is not visualized. Aortic valve sclerosis is present, with no evidence of aortic valve stenosis. Pulmonic Valve: The pulmonic valve was normal in structure. Pulmonic valve regurgitation is mild. Aorta: The aortic root, ascending aorta and aortic arch are all structurally normal, with no evidence of dilitation or obstruction. There is moderate (Grade III) protruding plaque. IAS/Shunts: No atrial level shunt detected by color flow Doppler.  TRICUSPID VALVE TR Peak grad:   28.6 mmHg TR Vmax:        267.53 cm/s Joelle Cedars Tonleu Electronically signed by Joelle Cedars Tonleu Signature Date/Time: 06/07/2024/12:58:51 PM    Final    EP STUDY Result Date: 06/07/2024 See surgical note for result.  MR BRAIN WO CONTRAST Result Date: 06/03/2024 EXAM: MRI BRAIN WITHOUT CONTRAST 06/03/2024 06:58:07 PM TECHNIQUE: Multiplanar multisequence MRI of the head/brain was performed without the administration of intravenous contrast. COMPARISON: Same day CT head and MRI head 11/11/2018. CLINICAL HISTORY: Transient ischemic attack (TIA). FINDINGS: BRAIN AND VENTRICLES: No acute infarct. No intracranial hemorrhage. No mass. No midline shift. No hydrocephalus. The sella is unremarkable. Normal flow voids. There are a few scattered areas of T2 and FLAIR hyperintensity in the periventricular and subcortical white matter compatible with chronic microvascular ischemic changes. There is redemonstration of a focal lesion within the left  parietal calvarium near midline. The lesion demonstrates T2 and FLAIR hyperintensity with T1 hypointensity. There is asymmetric involvement of the inner table of the calvarium. There is thinning of the outer table which is better appreciated on same day CT. The apparent soft tissue seen on CT appears unrelated to the lesion. CSF partially fills the defect along the inner table created by the lesion. There is no involvement of brain parenchyma. The lesion is noted adjacent to the superior sagittal sinus and may reflect a prominent arachnoid granulation. This focus is unchanged since at least 2020 and is favored benign. ORBITS: Bilateral lens replacement. SINUSES AND MASTOIDS: Mucosal thickening in the ethmoid and maxillary sinuses. BONES AND SOFT TISSUES: Normal marrow signal. No soft tissue abnormality. IMPRESSION: 1. Left parietal calvarium lesion is favored to reflect a prominent arachnoid granulation versus other benign etiology. Finding is unchanged since 2020. 2. No acute intracranial abnormality. Electronically signed by: Donnice Mania MD 06/03/2024 07:30 PM EST RP Workstation: [id number]   CT Angio Chest PE W and/or Wo Contrast Result Date: 06/03/2024 CLINICAL DATA:  Concern for pulmonary embolus EXAM: CT ANGIOGRAPHY CHEST WITH CONTRAST TECHNIQUE: Multidetector CT imaging of the chest was performed using the standard protocol during bolus administration of intravenous contrast. Multiplanar CT image reconstructions and MIPs were obtained to evaluate the vascular anatomy. RADIATION DOSE REDUCTION: This exam was performed according to the departmental dose-optimization program which includes automated exposure control, adjustment of the mA and/or kV according to patient size and/or use of iterative reconstruction technique. CONTRAST:  75mL OMNIPAQUE  IOHEXOL  350 MG/ML SOLN COMPARISON:  Chest CT dated 06/03/2024. FINDINGS: Cardiovascular: Mild cardiomegaly. No pericardial effusion. There is coronary vascular  calcification. There is mild atherosclerotic calcification of the thoracic aorta. No aneurysmal dilatation. Mildly dilated main pulmonary trunk suggestive of pulmonary hypertension. Evaluation of the pulmonary arteries is limited due to respiratory motion and streak artifact caused by patient's arms as well as due to suboptimal opacification of the distal branches and  timing of the contrast. No central pulmonary artery embolus identified. Mediastinum/Nodes: No hilar adenopathy. Evaluation of the left hilum is limited due to consolidative changes of the left lung. A cluster of lymph node in the subcarinal region measures up to 2 cm in diameter. The esophagus is grossly unremarkable no mediastinal fluid collection. Lungs/Pleura: Postsurgical changes of the left lower lobectomy and left lung base scarring. There is mucus secretion or aspirated content in the left upper lobe bronchus. Bilateral bronchial wall thickening and peribronchial edema. Patchy area of ground-glass and reticulonodular density in the posterior left upper lobe as well as additional scattered faint ground-glass areas in the right upper lobe may represent bronchopneumonia. There is a small right pleural effusion with partial compressive atelectasis of the right lung base. No pneumothorax. Upper Abdomen: No acute abnormality. Musculoskeletal: Osteopenia with degenerative changes of the spine. No acute osseous pathology. Review of the MIP images confirms the above findings. IMPRESSION: 1. No CT evidence of central pulmonary artery embolus. 2. Postsurgical changes of the left lower lobectomy and left lung base scarring. 3. Mucus secretion or aspirated content in the left upper lobe bronchus. 4. Patchy area of ground-glass and reticulonodular density in the posterior left upper lobe as well as additional scattered faint ground-glass areas in the right upper lobe may represent bronchopneumonia. 5. Small right pleural effusion with partial compressive  atelectasis of the right lung base. 6.  Aortic Atherosclerosis (ICD10-I70.0). Electronically Signed   By: Vanetta Chou M.D.   On: 06/03/2024 16:47   CT Head Wo Contrast Result Date: 06/03/2024 EXAM: CT HEAD WITHOUT CONTRAST 06/03/2024 04:30:43 PM TECHNIQUE: CT of the head was performed without the administration of intravenous contrast. Automated exposure control, iterative reconstruction, and/or weight based adjustment of the mA/kV was utilized to reduce the radiation dose to as low as reasonably achievable. COMPARISON: 04/19/2023 CLINICAL HISTORY: Mental status change, unknown cause. FINDINGS: BRAIN AND VENTRICLES: No acute hemorrhage. No evidence of acute infarct. No hydrocephalus. No extra-axial collection. No mass effect or midline shift. Atherosclerosis of the Carotid Siphons and Intracranial Vertebral Arteries. ORBITS: Bilateral lens replacement. SINUSES: Mucosal thickening throughout the paranasal sinuses most pronounced in the maxillary sinuses. SOFT TISSUES AND SKULL: There is a 1.2 x 1.0 x 2.2 cm irregular lytic lesion in the parietal calvarium left of midline primarily involving the inner table and diploic space with significant thinning of the outer table (series 4, image 25; series 8, image 33). There are likely areas of cortical breakthrough along the outer table. Focal soft tissue thickening in the scalp overlying the lesion (series 8, image 31). This is overall unchanged compared to the 2024 study. Sequelae of left mastoidectomy. IMPRESSION: 1. No acute intracranial abnormality. 2. 1.2 x 1.0 x 2.2 cm irregular lytic lesion in the parietal calvarium left of midline with possible overlying soft tissue thickening. Findings unchanged compared to the 2024 study. Consider nonemergent MRI head with and without contrast for further characterization. Electronically signed by: Donnice Mania MD 06/03/2024 04:39 PM EST RP Workstation: [id number]   DG Chest Portable 1 View Result Date:  06/03/2024 CLINICAL DATA:  Chest tightness. EXAM: PORTABLE CHEST 1 VIEW COMPARISON:  Chest radiograph dated 08/13/2023. FINDINGS: There is cardiomegaly with vascular congestion. Elevation of the left hemidiaphragm with left lung base chronic consolidation which may represent scarring. A small left pleural effusion and left lung base infiltrate is not excluded. Follow-up recommended. Left apical subpleural scarring. No pneumothorax. No acute osseous pathology. IMPRESSION: 1. Cardiomegaly with vascular congestion. 2. Chronic elevation of  the left hemidiaphragm with left lung base scarring. A small left pleural effusion and left lung base infiltrate is not excluded. Electronically Signed   By: Vanetta Chou M.D.   On: 06/03/2024 13:46      Subjective: Patient seen and examined at bedside today.  Hemodynamically stable.  Comfortable this morning.  He had some episodes of sweating, feeling unwell on 1/26, 8/27.  He feels much better today.  Wants to take a shower before going home.  We discussed about monitoring him another day if needed but he really wants to go home today.  Discharge planning discussed with wife at bedside  Discharge Exam: Vitals:   06/09/24 0450 06/09/24 0811  BP: 124/71 120/75  Pulse: (!) 105   Resp: 18 16  Temp: 97.7 F (36.5 C) 98.1 F (36.7 C)  SpO2: 96% 98%   Vitals:   06/08/24 2042 06/09/24 0000 06/09/24 0450 06/09/24 0811  BP: 117/84 105/72 124/71 120/75  Pulse: 96 (!) 103 (!) 105   Resp: 18 18 18 16   Temp: 98.4 F (36.9 C) (!) 97.3 F (36.3 C) 97.7 F (36.5 C) 98.1 F (36.7 C)  TempSrc: Oral Oral Oral Oral  SpO2: 97% 94% 96% 98%  Weight:      Height:        General: Pt is alert, awake, not in acute distress Cardiovascular: RRR, S1/S2 +, no rubs, no gallops Respiratory: CTA bilaterally, no wheezing, no rhonchi Abdominal: Soft, NT, ND, bowel sounds + Extremities: no edema, no cyanosis    The results of significant diagnostics from this  hospitalization (including imaging, microbiology, ancillary and laboratory) are listed below for reference.     Microbiology: Recent Results (from the past 240 hours)  Resp panel by RT-PCR (RSV, Flu A&B, Covid) Anterior Nasal Swab     Status: None   Collection Time: 06/03/24  2:33 PM   Specimen: Anterior Nasal Swab  Result Value Ref Range Status   SARS Coronavirus 2 by RT PCR NEGATIVE NEGATIVE Final   Influenza A by PCR NEGATIVE NEGATIVE Final   Influenza B by PCR NEGATIVE NEGATIVE Final    Comment: (NOTE) The Xpert Xpress SARS-CoV-2/FLU/RSV plus assay is intended as an aid in the diagnosis of influenza from Nasopharyngeal swab specimens and should not be used as a sole basis for treatment. Nasal washings and aspirates are unacceptable for Xpert Xpress SARS-CoV-2/FLU/RSV testing.  Fact Sheet for Patients: bloggercourse.com  Fact Sheet for Healthcare Providers: seriousbroker.it  This test is not yet approved or cleared by the [place]  FDA and has been authorized for detection and/or diagnosis of SARS-CoV-2 by FDA under an Emergency Use Authorization (EUA). This EUA will remain in effect (meaning this test can be used) for the duration of the COVID-19 declaration under Section 564(b)(1) of the Act, 21 U.S.C. section 360bbb-3(b)(1), unless the authorization is terminated or revoked.     Resp Syncytial Virus by PCR NEGATIVE NEGATIVE Final    Comment: (NOTE) Fact Sheet for Patients: bloggercourse.com  Fact Sheet for Healthcare Providers: seriousbroker.it  This test is not yet approved or cleared by the [place]  FDA and has been authorized for detection and/or diagnosis of SARS-CoV-2 by FDA under an Emergency Use Authorization (EUA). This EUA will remain in effect (meaning this test can be used) for the duration of the COVID-19 declaration under Section 564(b)(1) of the  Act, 21 U.S.C. section 360bbb-3(b)(1), unless the authorization is terminated or revoked.  Performed at [facility], [address] [place],  [place] [address]      Labs: BNP (last 3 results) No results for input(s): BNP in the last 8760 hours. Basic Metabolic Panel: Recent Labs  Lab 06/03/24 1404 06/04/24 0257 06/05/24 0510 06/06/24 0304 06/07/24 2002 06/09/24 0540  NA 139 136 136 137 137 138  K 4.0 4.2 4.7 4.2 4.2 4.4  CL 103 103 102 102 100 103  CO2 24 22 23 25 27 25   GLUCOSE 121* 263* 158* 143* 199* 145*  BUN 10 12 20  24* 28* 22  CREATININE 0.89 1.03 1.13 1.11 1.15 1.02  CALCIUM  8.5* 8.8* 8.9 8.4* 8.7* 8.7*  MG 1.8  --   --   --   --   --    Liver Function Tests: Recent Labs  Lab 06/03/24 1404  AST 18  ALT 19  ALKPHOS 80  BILITOT 0.5  PROT 6.3*  ALBUMIN 3.7   No results for input(s): LIPASE, AMYLASE in the last 168 hours. No results for input(s): AMMONIA in the last 168 hours. CBC: Recent Labs  Lab 06/03/24 1404 06/04/24 0257 06/05/24 0510 06/06/24 0304 06/07/24 2002 06/09/24 0540  WBC 5.4 5.4 7.2 7.0 6.9 7.7  NEUTROABS 3.9  --   --   --   --   --   HGB 11.2* 11.5* 12.0* 12.4* 12.6* 13.6  HCT 34.6* 37.1* 37.9* 38.9* 38.4* 42.3  MCV 88.7 90.7 88.8 88.2 87.7 86.7  PLT 146* 153 199 165 198 206   Cardiac Enzymes: No results for input(s): CKTOTAL, CKMB, CKMBINDEX, TROPONINI in the last 168 hours. BNP: Invalid input(s): POCBNP CBG: Recent Labs  Lab 06/08/24 0759 06/08/24 1156 06/08/24 1631 06/08/24 2117 06/09/24 0818  GLUCAP 242* 260* 194* 155* 149*   D-Dimer No results for input(s): DDIMER in the last 72 hours. Hgb A1c No results for input(s): HGBA1C in the last 72 hours. Lipid Profile No results for input(s): CHOL, HDL, LDLCALC, TRIG, CHOLHDL, LDLDIRECT in the last 72 hours. Thyroid  function studies No results for input(s): TSH, T4TOTAL, T3FREE, THYROIDAB in the last 72 hours.  Invalid  input(s): FREET3 Anemia work up No results for input(s): VITAMINB12, FOLATE, FERRITIN, TIBC, IRON, RETICCTPCT in the last 72 hours. Urinalysis    Component Value Date/Time   COLORURINE AMBER (A) 06/03/2024 1542   APPEARANCEUR CLEAR 06/03/2024 1542   LABSPEC 1.017 06/03/2024 1542   PHURINE 5.0 06/03/2024 1542   GLUCOSEU >=500 (A) 06/03/2024 1542   HGBUR NEGATIVE 06/03/2024 1542   BILIRUBINUR NEGATIVE 06/03/2024 1542   KETONESUR NEGATIVE 06/03/2024 1542   PROTEINUR NEGATIVE 06/03/2024 1542   NITRITE NEGATIVE 06/03/2024 1542   LEUKOCYTESUR NEGATIVE 06/03/2024 1542   Sepsis Labs Recent Labs  Lab 06/05/24 0510 06/06/24 0304 06/07/24 2002 06/09/24 0540  WBC 7.2 7.0 6.9 7.7   Microbiology Recent Results (from the past 240 hours)  Resp panel by RT-PCR (RSV, Flu A&B, Covid) Anterior Nasal Swab     Status: None   Collection Time: 06/03/24  2:33 PM   Specimen: Anterior Nasal Swab  Result Value Ref Range Status   SARS Coronavirus 2 by RT PCR NEGATIVE NEGATIVE Final   Influenza A by PCR NEGATIVE NEGATIVE Final   Influenza B by PCR NEGATIVE NEGATIVE Final    Comment: (NOTE) The Xpert Xpress SARS-CoV-2/FLU/RSV plus assay is intended as an aid in the diagnosis of influenza from Nasopharyngeal swab specimens and should not be used as a sole basis for treatment. Nasal washings and aspirates are unacceptable for Xpert Xpress SARS-CoV-2/FLU/RSV testing.  Fact Sheet for Patients:  bloggercourse.com  Fact Sheet for Healthcare Providers: seriousbroker.it  This test is not yet approved or cleared by the [place]  FDA and has been authorized for detection and/or diagnosis of SARS-CoV-2 by FDA under an Emergency Use Authorization (EUA). This EUA will remain in effect (meaning this test can be used) for the duration of the COVID-19 declaration under Section 564(b)(1) of the Act, 21 U.S.C. section 360bbb-3(b)(1), unless the  authorization is terminated or revoked.     Resp Syncytial Virus by PCR NEGATIVE NEGATIVE Final    Comment: (NOTE) Fact Sheet for Patients: bloggercourse.com  Fact Sheet for Healthcare Providers: seriousbroker.it  This test is not yet approved or cleared by the [place]  FDA and has been authorized for detection and/or diagnosis of SARS-CoV-2 by FDA under an Emergency Use Authorization (EUA). This EUA will remain in effect (meaning this test can be used) for the duration of the COVID-19 declaration under Section 564(b)(1) of the Act, 21 U.S.C. section 360bbb-3(b)(1), unless the authorization is terminated or revoked.  Performed at [facility], [address] [place], [place] [address]     Please note: You were cared for by a hospitalist during your hospital stay. Once you are discharged, your primary care physician will handle any further medical issues. Please note that NO REFILLS for any discharge medications will be authorized once you are discharged, as it is imperative that you return to your primary care physician (or establish a relationship with a primary care physician if you do not have one) for your post hospital discharge needs so that they can reassess your need for medications and monitor your lab values.    Time coordinating discharge: 40 minutes  SIGNED:   Ivonne Mustache, MD  [facility] 06/09/2024, 11:06 AM Pager [phone]  If 7PM-7AM, please contact night-coverage [email or web address] Password [email or web address]    [1]  Allergies Allergen Reactions   Crestor [Rosuvastatin] Other (See Comments)    myalgias   Lipitor [Atorvastatin] Other (See Comments)    myalgias

## 2024-06-09 NOTE — Progress Notes (Signed)
"  °  Progress Note  Patient Name: Herbert LARKEY Sr. Date of Encounter: 06/09/2024 [facility] Cardiologist: [name], DO   Interval Summary   Doing well today. Overnight episode of diaphoresis similar to that of yesterday evening, however, new complaint of pain localized to lower left sided rib cage. Pain is described as a 5/10 in severity, intermittent sharp poke that lasted 30 minutes. No recurrence of pain outside of this episode. No residual tenderness on exam and not reproducible with palpation. Able to ambulate in the halls yesterday without this pain, no chest pain or dyspnea either. Besides this, no other complaints or concerns today  Vital Signs Vitals:   06/08/24 1629 06/08/24 2042 06/09/24 0000 06/09/24 0450  BP: 109/74 117/84 105/72 124/71  Pulse:  96 (!) 103 (!) 105  Resp: 18 18 18 18   Temp: 97.7 F (36.5 C) 98.4 F (36.9 C) (!) 97.3 F (36.3 C) 97.7 F (36.5 C)  TempSrc: Oral Oral Oral Oral  SpO2: 99% 97% 94% 96%  Weight:      Height:        Intake/Output Summary (Last 24 hours) at 06/09/2024 0729 Last data filed at 06/09/2024 0530 Gross per 24 hour  Intake 240 ml  Output 1000 ml  Net -760 ml      06/04/2024   11:56 AM 06/03/2024   12:51 PM 03/16/2024    2:07 PM  Last 3 Weights  Weight (lbs) 185 lb 14.4 oz 182 lb 181 lb  Weight (kg) 84.324 kg 82.555 kg 82.101 kg      Telemetry/ECG  Atypical flutter, rates in the 80s-90s  - Personally Reviewed  Physical Exam  GEN: Resting in bed comfortably, in no acute distress.   Neck: No JVD Cardiac: Regular rate, tachycardic, no murmurs, rubs, or gallops.  Respiratory: Moderate rhonchi on auscultation bilaterally, R > L GI: Soft, nontender MS: No peripheral edema  Assessment & Plan  Atrial fibrillation/Atrial flutter with RVR Frequent PVCs Was previously on amiodarone  up until 03/16/24 OP visit S/p successful TEE/DCCV 1/26 Overnight episode similar to that of 1/26 with acute onset diaphoresis but new  complaint of intermittent 5/10 pain localized to lower left-sided rib cage. Vitals remain stable. EKG 1/27 showed rate-controlled atrial fibrillation, grossly unchanged from prior EKG. CXR showed stable cardiomegaly, bilateral interstitial prominence, left basilar opacity likely a combination of left pleural effusion and atelectasis, and small right pleural effusion. CBC and BMP unremarkable this AM. 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 Also limited by sinus bradycardia on beta blocker Continue Farxiga  10 mg   Per primary COPD  For questions or updates, please contact [facility] Please consult [email or web address] for contact info under         Signed, Owen MARLA Daniels, PA-C   "

## 2024-06-23 ENCOUNTER — Institutional Professional Consult (permissible substitution) (HOSPITAL_BASED_OUTPATIENT_CLINIC_OR_DEPARTMENT_OTHER): Admitting: Internal Medicine

## 2024-06-24 ENCOUNTER — Ambulatory Visit: Admitting: Cardiology
